# Patient Record
Sex: Male | Born: 1990 | Race: White | Hispanic: No | Marital: Single | Smoking: Current some day smoker
Health system: Southern US, Community
[De-identification: ages and names within clinical notes are randomized; demographics above are authoritative.]

## PROBLEM LIST (undated history)

## (undated) DIAGNOSIS — Z21 Asymptomatic human immunodeficiency virus [HIV] infection status: Secondary | ICD-10-CM

## (undated) DIAGNOSIS — B20 Human immunodeficiency virus [HIV] disease: Secondary | ICD-10-CM

## (undated) HISTORY — DX: Asymptomatic human immunodeficiency virus (hiv) infection status: Z21

## (undated) HISTORY — PX: INNER EAR SURGERY: SHX679

## (undated) HISTORY — DX: Human immunodeficiency virus (HIV) disease: B20

## (undated) HISTORY — PX: ANAL FISTULOTOMY: SHX6423

---
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
## 2010-07-23 ENCOUNTER — Emergency Department (HOSPITAL_COMMUNITY)
Admission: EM | Admit: 2010-07-23 | Discharge: 2010-07-23 | Payer: Self-pay | Source: Home / Self Care | Admitting: Emergency Medicine

## 2010-10-04 LAB — URINALYSIS, ROUTINE W REFLEX MICROSCOPIC
Nitrite: NEGATIVE
Protein, ur: NEGATIVE mg/dL
Urobilinogen, UA: 0.2 mg/dL (ref 0.0–1.0)

## 2010-10-04 LAB — BASIC METABOLIC PANEL
BUN: 14 mg/dL (ref 6–23)
CO2: 24 mEq/L (ref 19–32)
Calcium: 9 mg/dL (ref 8.4–10.5)
Chloride: 105 mEq/L (ref 96–112)
Creatinine, Ser: 0.9 mg/dL (ref 0.4–1.5)
GFR calc Af Amer: >60 mL/min (ref >60)

## 2010-10-04 LAB — ETHANOL: Alcohol, Ethyl (B): 243 mg/dL — ABNORMAL HIGH (ref 0–10)

## 2010-10-04 LAB — CBC
MCH: 30.9 pg (ref 26.0–34.0)
MCV: 88.8 fL (ref 78.0–100.0)
Platelets: 190 10*3/uL (ref 150–400)
RBC: 4.99 MIL/uL (ref 4.22–5.81)
RDW: 12.7 % (ref 11.5–15.5)
WBC: 11.3 10*3/uL — ABNORMAL HIGH (ref 4.0–10.5)

## 2010-10-04 LAB — DIFFERENTIAL
Basophils Absolute: 0 10*3/uL (ref 0.0–0.1)
Eosinophils Absolute: 0 10*3/uL (ref 0.0–0.7)
Eosinophils Relative: 0 % (ref 0–5)
Neutrophils Relative %: 87 % — ABNORMAL HIGH (ref 43–77)

## 2010-10-04 LAB — RAPID URINE DRUG SCREEN, HOSP PERFORMED: Barbiturates: NOT DETECTED

## 2011-01-19 ENCOUNTER — Emergency Department (HOSPITAL_COMMUNITY)
Admission: EM | Admit: 2011-01-19 | Discharge: 2011-01-19 | Disposition: A | Discharge status: Home / Self Care | Payer: Medicaid Other | Attending: Emergency Medicine | Admitting: Emergency Medicine

## 2011-01-19 DIAGNOSIS — Y92009 Unspecified place in unspecified non-institutional (private) residence as the place of occurrence of the external cause: Secondary | ICD-10-CM | POA: Insufficient documentation

## 2011-01-19 DIAGNOSIS — W540XXA Bitten by dog, initial encounter: Secondary | ICD-10-CM | POA: Insufficient documentation

## 2011-01-19 DIAGNOSIS — IMO0002 Reserved for concepts with insufficient information to code with codable children: Secondary | ICD-10-CM | POA: Insufficient documentation

## 2021-05-24 ENCOUNTER — Encounter (HOSPITAL_COMMUNITY): Payer: Self-pay

## 2021-05-24 ENCOUNTER — Emergency Department (HOSPITAL_COMMUNITY)
Admission: EM | Admit: 2021-05-24 | Discharge: 2021-05-24 | Disposition: A | Discharge status: Home / Self Care | Payer: Self-pay | Attending: Emergency Medicine | Admitting: Emergency Medicine

## 2021-05-24 ENCOUNTER — Other Ambulatory Visit: Payer: Self-pay

## 2021-05-24 DIAGNOSIS — K644 Residual hemorrhoidal skin tags: Secondary | ICD-10-CM | POA: Insufficient documentation

## 2021-05-24 DIAGNOSIS — K6289 Other specified diseases of anus and rectum: Secondary | ICD-10-CM

## 2021-05-24 LAB — CBC WITH DIFFERENTIAL/PLATELET
Abs Immature Granulocytes: 0.03 10*3/uL (ref 0.00–0.07)
Basophils Absolute: 0 10*3/uL (ref 0.0–0.1)
Basophils Relative: 0 %
Eosinophils Absolute: 0 10*3/uL (ref 0.0–0.5)
Eosinophils Relative: 0 %
HCT: 41.9 % (ref 39.0–52.0)
Hemoglobin: 13.9 g/dL (ref 13.0–17.0)
Immature Granulocytes: 0 %
Lymphocytes Relative: 14 %
Lymphs Abs: 1.2 10*3/uL (ref 0.7–4.0)
MCH: 29.1 pg (ref 26.0–34.0)
MCHC: 33.2 g/dL (ref 30.0–36.0)
MCV: 87.8 fL (ref 80.0–100.0)
Monocytes Absolute: 0.5 10*3/uL (ref 0.1–1.0)
Monocytes Relative: 6 %
Neutro Abs: 7.1 10*3/uL (ref 1.7–7.7)
Neutrophils Relative %: 80 %
Platelets: 203 10*3/uL (ref 150–400)
RBC: 4.77 MIL/uL (ref 4.22–5.81)
RDW: 13.5 % (ref 11.5–15.5)
WBC: 8.8 10*3/uL (ref 4.0–10.5)
nRBC: 0 % (ref 0.0–0.2)

## 2021-05-24 LAB — COMPREHENSIVE METABOLIC PANEL
ALT: 31 U/L (ref 0–44)
AST: 32 U/L (ref 15–41)
Albumin: 4.2 g/dL (ref 3.5–5.0)
Alkaline Phosphatase: 67 U/L (ref 38–126)
Anion gap: 10 (ref 5–15)
BUN: 8 mg/dL (ref 6–20)
CO2: 27 mmol/L (ref 22–32)
Calcium: 9.4 mg/dL (ref 8.9–10.3)
Chloride: 102 mmol/L (ref 98–111)
Creatinine, Ser: 0.79 mg/dL (ref 0.61–1.24)
GFR, Estimated: >60 mL/min (ref >60)
Glucose, Bld: 85 mg/dL (ref 70–99)
Potassium: 3.7 mmol/L (ref 3.5–5.1)
Sodium: 139 mmol/L (ref 135–145)
Total Bilirubin: 0.6 mg/dL (ref 0.3–1.2)
Total Protein: 8.9 g/dL — ABNORMAL HIGH (ref 6.5–8.1)

## 2021-05-24 LAB — URINALYSIS, ROUTINE W REFLEX MICROSCOPIC
Bilirubin Urine: NEGATIVE
Glucose, UA: NEGATIVE mg/dL
Hgb urine dipstick: NEGATIVE
Ketones, ur: NEGATIVE mg/dL
Leukocytes,Ua: NEGATIVE
Nitrite: NEGATIVE
Protein, ur: NEGATIVE mg/dL
Specific Gravity, Urine: 1.018 (ref 1.005–1.030)
pH: 7 (ref 5.0–8.0)

## 2021-05-24 LAB — PROTIME-INR
INR: 1 (ref 0.8–1.2)
Prothrombin Time: 12.7 seconds (ref 11.4–15.2)

## 2021-05-24 LAB — TYPE AND SCREEN
ABO/RH(D): O NEG
Antibody Screen: NEGATIVE

## 2021-05-24 LAB — LIPASE, BLOOD: Lipase: 26 U/L (ref 11–51)

## 2021-05-24 MED ORDER — POLYETHYLENE GLYCOL 3350 17 GM/SCOOP PO POWD: 17.0000 g | Freq: Every day | ORAL | 0 refills | Status: AC

## 2021-05-24 MED ORDER — PRAMOXINE HCL (PERIANAL) 1 % EX FOAM: 1.0000 "application " | Freq: Three times a day (TID) | CUTANEOUS | 0 refills | Status: DC | PRN

## 2021-05-24 NOTE — ED Provider Notes (Signed)
[facility] DEPT Provider Note   CSN: [id number] Arrival date & time: 05/24/21  1328     History Chief Complaint  Patient presents with   Hemorrhoids   Rectal Bleeding    Gary Barrett is a 30 y.o. male.  This is a 30 y.o. male with significant medical history as below, including family history of colon cancer, hemorrhoids who presents to the ED with complaint of pain with defecation, blood in bowel movements  Location:  peri-anal Duration:  intermittent over last month, worst in last week or so Onset:  gradual Timing:  intermittent to constant  Description:  sharp pain to rectal area Severity:  mild Exacerbating/Alleviating Factors:  worse with defecation Associated Symptoms:  intermittent constipation, blood in stool. He has limited oral intake to help reduce number of bowel movements. Pertinent Negatives:  no fevers, chills, nausea, vomiting. No cp or dyspnea.     The history is provided by the patient. No language interpreter was used.  Rectal Bleeding Associated symptoms: no abdominal pain, no fever and no vomiting       History reviewed. No pertinent past medical history.  There are no problems to display for this patient.   History reviewed. No pertinent surgical history.     History reviewed. No pertinent family history.     Home Medications Prior to Admission medications   Medication Sig Start Date End Date Taking? Authorizing Provider  polyethylene glycol powder (GLYCOLAX/MIRALAX) 17 GM/SCOOP powder Take 17 g by mouth daily for 7 doses. 05/24/21 05/31/21 Yes Wynona Dove A, DO  pramoxine (PROCTOFOAM) 1 % foam Place 1 application rectally 3 (three) times daily as needed for hemorrhoids. 05/24/21  Yes Jeanell Sparrow, DO    Allergies    Patient has no known allergies.  Review of Systems   Review of Systems  Constitutional:  Negative for chills and fever.  HENT:  Negative for facial swelling and trouble swallowing.    Eyes:  Negative for photophobia and visual disturbance.  Respiratory:  Negative for cough and shortness of breath.   Cardiovascular:  Negative for chest pain and palpitations.  Gastrointestinal:  Positive for blood in stool, constipation, hematochezia and rectal pain. Negative for abdominal pain, nausea and vomiting.  Endocrine: Negative for polydipsia and polyuria.  Genitourinary:  Negative for difficulty urinating and hematuria.  Musculoskeletal:  Negative for gait problem and joint swelling.  Skin:  Negative for pallor and rash.  Neurological:  Negative for syncope and headaches.  Psychiatric/Behavioral:  Negative for agitation and confusion.    Physical Exam Updated Vital Signs BP 134/84 (BP Location: Left Arm)   Pulse 96   Temp 98.4 F (36.9 C) (Oral)   Resp 18   SpO2 100%   Physical Exam Vitals and nursing note reviewed. Exam conducted with a chaperone present.  Constitutional:      General: He is not in acute distress.    Appearance: He is well-developed.  HENT:     Head: Normocephalic and atraumatic.     Right Ear: External ear normal.     Left Ear: External ear normal.     Mouth/Throat:     Mouth: Mucous membranes are moist.  Eyes:     General: No scleral icterus. Cardiovascular:     Rate and Rhythm: Normal rate and regular rhythm.     Pulses: Normal pulses.     Heart sounds: Normal heart sounds.  Pulmonary:     Effort: Pulmonary effort is normal. No respiratory distress.  Breath sounds: Normal breath sounds.  Abdominal:     General: Abdomen is flat.     Palpations: Abdomen is soft.     Tenderness: There is no abdominal tenderness.  Genitourinary:   Musculoskeletal:        General: Normal range of motion.     Cervical back: Normal range of motion.     Right lower leg: No edema.     Left lower leg: No edema.  Skin:    General: Skin is warm and dry.     Capillary Refill: Capillary refill takes less than 2 seconds.  Neurological:     Mental Status:  He is alert and oriented to person, place, and time.  Psychiatric:        Mood and Affect: Mood normal.        Behavior: Behavior normal.    ED Results / Procedures / Treatments   Labs (all labs ordered are listed, but only abnormal results are displayed) Labs Reviewed  COMPREHENSIVE METABOLIC PANEL - Abnormal; Notable for the following components:      Result Value   Total Protein 8.9 (*)    All other components within normal limits  CBC WITH DIFFERENTIAL/PLATELET  PROTIME-INR  LIPASE, BLOOD  URINALYSIS, ROUTINE W REFLEX MICROSCOPIC  TYPE AND SCREEN    EKG None  Radiology No results found.  Procedures Procedures   Medications Ordered in ED Medications - No data to display  ED Course  I have reviewed the triage vital signs and the nursing notes.  Pertinent labs & imaging results that were available during my care of the patient were reviewed by me and considered in my medical decision making (see chart for details).    MDM Rules/Calculators/A&P                           CC: rectal pain  This patient complains of above; this involves an extensive number of treatment options and is a complaint that carries with it a high risk of complications and morbidity. Vital signs were reviewed. Serious etiologies considered.  Physical exam consistent with external hemorrhoid, does not appear to be thrombosed.  No frank bleeding externally appreciated.  No fissure.  No recent sexual activity  Record review:  Previous records obtained and reviewed    Work up as above, notable for:  Labs  that were available during my care of the patient were reviewed by me and considered in my medical decision making.    Labs reviewed and are stable  Hgb stable.  Patient with external hemorrhoid on exam.  Discussed supportive care at home. Sitz baths, laxative, topical therapy, o/p f/u with GI for c-scope. Pt agreeable.  The patient improved significantly and was discharged in stable  condition. Detailed discussions were had with the patient regarding current findings, and need for close f/u with PCP or on call doctor. The patient has been instructed to return immediately if the symptoms worsen in any way for re-evaluation. Patient verbalized understanding and is in agreement with current care plan. All questions answered prior to discharge.            This chart was dictated using voice recognition software.  Despite best efforts to proofread,  errors can occur which can change the documentation meaning.  Final Clinical Impression(s) / ED Diagnoses Final diagnoses:  External hemorrhoid  Rectal pain    Rx / DC Orders ED Discharge Orders  Ordered    pramoxine (PROCTOFOAM) 1 % foam  3 times daily PRN        05/24/21 2112    polyethylene glycol powder (GLYCOLAX/MIRALAX) 17 GM/SCOOP powder  Daily        05/24/21 2112             Sloan Leiter, DO 05/24/21 2115

## 2021-05-24 NOTE — ED Provider Notes (Signed)
Emergency Medicine Provider Triage Evaluation Note  Gary Barrett , a 30 y.o. male  was evaluated in triage.  Pt complains of rectal pain and rectal bleeding.  Been ongoing for couple weeks, he tried Metamucil and Preparation H.  Bright red blood per rectum, increasing amounts of blood every bowel movement.  Nausea and vomiting the last 2 days.    No prior abdominal surgeries, not on blood thinners, no history of ulcers, no anti-inflammatory use, alcohol once a week, no dark and tarry stool, not on iron supplements or Pepto-Bismol.    Strong family history for colon cancer, has not had a colonoscopy yet.  No history of IBD..  .  Review of Systems  Positive: Rectal bleeding, rectal pain, nausea, vomiting Negative: Fevers  Physical Exam  BP 134/84 (BP Location: Left Arm)   Pulse 96   Temp 98.4 F (36.9 C) (Oral)   Resp 18   SpO2 100%  Gen:   Awake, no distress   Resp:  Normal effort  MSK:   Moves extremities without difficulty  Other:  Abdomen is soft, not particularly tender.  Rectal exam deferred till in room.  Medical Decision Making  Medically screening exam initiated at 2:25 PM.  Appropriate orders placed.  HIROYUKI OZANICH was informed that the remainder of the evaluation will be completed by another provider, this initial triage assessment does not replace that evaluation, and the importance of remaining in the ED until their evaluation is complete.  Rectal bleeding work-up   Theron Arista, PA-C 05/24/21 1429    Derwood Kaplan, MD 05/26/21 [phone]

## 2021-05-24 NOTE — ED Triage Notes (Signed)
Pt reports severe rectal pain and blood bowel movements. Pt reports hx of hemorrhoids and believes this is related to his symptoms.

## 2021-05-24 NOTE — Discharge Instructions (Addendum)
Increase water intake, continue taking metamucil

## 2021-10-17 ENCOUNTER — Encounter (HOSPITAL_COMMUNITY): Payer: Self-pay

## 2021-10-17 ENCOUNTER — Emergency Department (HOSPITAL_COMMUNITY): Payer: Self-pay

## 2021-10-17 ENCOUNTER — Observation Stay (HOSPITAL_COMMUNITY)
Admission: EM | Admit: 2021-10-17 | Discharge: 2021-10-19 | Disposition: A | Discharge status: Home / Self Care | Payer: Self-pay | Attending: Internal Medicine | Admitting: Internal Medicine

## 2021-10-17 DIAGNOSIS — R61 Generalized hyperhidrosis: Secondary | ICD-10-CM

## 2021-10-17 DIAGNOSIS — F17210 Nicotine dependence, cigarettes, uncomplicated: Secondary | ICD-10-CM | POA: Insufficient documentation

## 2021-10-17 DIAGNOSIS — Z7252 High risk homosexual behavior: Secondary | ICD-10-CM

## 2021-10-17 DIAGNOSIS — G629 Polyneuropathy, unspecified: Secondary | ICD-10-CM

## 2021-10-17 DIAGNOSIS — Z114 Encounter for screening for human immunodeficiency virus [HIV]: Secondary | ICD-10-CM

## 2021-10-17 DIAGNOSIS — R55 Syncope and collapse: Principal | ICD-10-CM | POA: Diagnosis present

## 2021-10-17 DIAGNOSIS — Z8241 Family history of sudden cardiac death: Secondary | ICD-10-CM

## 2021-10-17 DIAGNOSIS — R0602 Shortness of breath: Secondary | ICD-10-CM | POA: Insufficient documentation

## 2021-10-17 DIAGNOSIS — Z8249 Family history of ischemic heart disease and other diseases of the circulatory system: Secondary | ICD-10-CM

## 2021-10-17 DIAGNOSIS — R519 Headache, unspecified: Secondary | ICD-10-CM

## 2021-10-17 LAB — CBC
HCT: 41.5 % (ref 39.0–52.0)
Hemoglobin: 13.8 g/dL (ref 13.0–17.0)
MCH: 29 pg (ref 26.0–34.0)
MCHC: 33.3 g/dL (ref 30.0–36.0)
MCV: 87.2 fL (ref 80.0–100.0)
Platelets: 184 10*3/uL (ref 150–400)
RBC: 4.76 MIL/uL (ref 4.22–5.81)
RDW: 13.7 % (ref 11.5–15.5)
WBC: 4.3 10*3/uL (ref 4.0–10.5)
nRBC: 0 % (ref 0.0–0.2)

## 2021-10-17 LAB — BASIC METABOLIC PANEL
Anion gap: 7 (ref 5–15)
BUN: 17 mg/dL (ref 6–20)
CO2: 30 mmol/L (ref 22–32)
Calcium: 9.2 mg/dL (ref 8.9–10.3)
Chloride: 103 mmol/L (ref 98–111)
Creatinine, Ser: 1.21 mg/dL (ref 0.61–1.24)
GFR, Estimated: >60 mL/min (ref >60)
Glucose, Bld: 85 mg/dL (ref 70–99)
Potassium: 3.8 mmol/L (ref 3.5–5.1)
Sodium: 140 mmol/L (ref 135–145)

## 2021-10-17 LAB — URINALYSIS, ROUTINE W REFLEX MICROSCOPIC
Bilirubin Urine: NEGATIVE
Glucose, UA: NEGATIVE mg/dL
Hgb urine dipstick: NEGATIVE
Ketones, ur: NEGATIVE mg/dL
Leukocytes,Ua: NEGATIVE
Nitrite: NEGATIVE
Protein, ur: 30 mg/dL — AB
Specific Gravity, Urine: 1.02 (ref 1.005–1.030)
pH: 6.5 (ref 5.0–8.0)

## 2021-10-17 LAB — D-DIMER, QUANTITATIVE: D-Dimer, Quant: <0.27 ug/mL-FEU (ref 0.00–0.50)

## 2021-10-17 LAB — URINALYSIS, MICROSCOPIC (REFLEX)

## 2021-10-17 LAB — CBG MONITORING, ED: Glucose-Capillary: 97 mg/dL (ref 70–99)

## 2021-10-17 LAB — BRAIN NATRIURETIC PEPTIDE: B Natriuretic Peptide: 13.8 pg/mL (ref 0.0–100.0)

## 2021-10-17 LAB — TROPONIN I (HIGH SENSITIVITY): Troponin I (High Sensitivity): 21 ng/L — ABNORMAL HIGH (ref <18)

## 2021-10-17 NOTE — ED Triage Notes (Signed)
Patient arrives with complaints of a headache over the last week and two syncopal episodes, last time just prior to arrival. Vomited once after last syncopal episode.  ?

## 2021-10-17 NOTE — ED Notes (Signed)
Ambulated pt in hallway, pt O2 stayed above 94%, pt stated he did not feel lightheaded or dizzy.  ?

## 2021-10-17 NOTE — ED Provider Notes (Signed)
?[facility] DEPT ?Provider Note ? ? ?CSN: [id number] ?Arrival date & time: 10/17/21  1915 ? ?  ? ?History ? ?Chief Complaint  ?Patient presents with  ?? Loss of Consciousness  ? ? ?Gary Barrett is a 31 y.o. male with no significant past medical history who presents emergency department with complaint of exertional dyspnea and syncope.  Patient complains of 3 episodes of syncope over the past week.  Patient states that he has a prodrome of vision loss, jerking of his muscles, diffuse diaphoresis and was able to usually get himself down and sit.  Today he had an episode while at [employer] and lost consciousness.  This was followed by an episode of vomiting.  Patient also reports that he has had constant exertional dyspnea over the past week as well.  He notes that when he walks short distances he gets extremely winded and has to sit down and rest.  This is new.  Patient states that he was diagnosed with prolapsed hemorrhoids that have been persistently bleeding and thought maybe this was due to slow oozing from his bottom.  He is uninsured and has not yet been able to follow-up with anyone in the outpatient setting.  He has no significant rectal pain.  He denies unilateral leg swelling.  He has no history of DVT or PE.  His mother did die 2 months ago after 3 heart attacks.  He states that he has been smoking cigarettes since then and wondered if this might be the cause.  He has states he has been sleeping okay and eating and drinking well.  He denies any nausea vomiting or diarrhea that might contribute to volume loss, palpitations, racing or skipping in his heart.  He has a family history of sudden cardiac death in a first cousin.  Patient denies chest pain, hemoptysis, orthopnea or PND. ? ? ?Loss of Consciousness ? ?  ? ?Home Medications ?Prior to Admission medications   ?Medication Sig Start Date End Date Taking? Authorizing Provider  ?pramoxine (PROCTOFOAM) 1 % foam Place 1  application rectally 3 (three) times daily as needed for hemorrhoids. 05/24/21   Jeanell Sparrow, DO  ?   ? ?Allergies    ?Patient has no known allergies.   ? ?Review of Systems   ?Review of Systems  ?Cardiovascular:  Positive for syncope.  ? ?Physical Exam ?Updated Vital Signs ?BP 128/87   Pulse 96   Temp 98.1 ?F (36.7 ?C) (Oral)   Resp (!) 21   Wt 77.1 kg   SpO2 93%  ?Physical Exam ?Vitals and nursing note reviewed.  ?Constitutional:   ?   General: He is not in acute distress. ?   Appearance: He is well-developed. He is not diaphoretic.  ?HENT:  ?   Head: Normocephalic and atraumatic.  ?Eyes:  ?   General: No scleral icterus. ?   Conjunctiva/sclera: Conjunctivae normal.  ?Cardiovascular:  ?   Rate and Rhythm: Normal rate and regular rhythm.  ?   Heart sounds: Normal heart sounds.  ?Pulmonary:  ?   Effort: Pulmonary effort is normal. No respiratory distress.  ?   Breath sounds: Normal breath sounds.  ?Abdominal:  ?   Palpations: Abdomen is soft.  ?   Tenderness: There is no abdominal tenderness.  ?Musculoskeletal:  ?   Cervical back: Normal range of motion and neck supple.  ?   Right lower leg: No edema.  ?   Left lower leg: No edema.  ?Skin: ?  General: Skin is warm and dry.  ?Neurological:  ?   General: No focal deficit present.  ?   Mental Status: He is alert and oriented to person, place, and time.  ?   Cranial Nerves: No cranial nerve deficit.  ?   Sensory: No sensory deficit.  ?   Motor: No weakness.  ?   Coordination: Coordination normal.  ?   Gait: Gait normal.  ?   Deep Tendon Reflexes: Reflexes normal.  ?Psychiatric:     ?   Behavior: Behavior normal.  ? ? ?ED Results / Procedures / Treatments   ?Labs ?(all labs ordered are listed, but only abnormal results are displayed) ?Labs Reviewed  ?URINALYSIS, ROUTINE W REFLEX MICROSCOPIC - Abnormal; Notable for the following components:  ?    Result Value  ? Protein, ur 30 (*)   ? All other components within normal limits  ?URINALYSIS, MICROSCOPIC (REFLEX) -  Abnormal; Notable for the following components:  ? Bacteria, UA RARE (*)   ? All other components within normal limits  ?BASIC METABOLIC PANEL  ?CBC  ?D-DIMER, QUANTITATIVE  ?BRAIN NATRIURETIC PEPTIDE  ?CBG MONITORING, ED  ?TROPONIN I (HIGH SENSITIVITY)  ? ? ?EKG ?EKG Interpretation ? ?Date/Time:  Sunday October 17 2021 19:30:58 EDT ?Ventricular Rate:  87 ?PR Interval:  117 ?QRS Duration: 89 ?QT Interval:  342 ?QTC Calculation: 412 ?R Axis:   93 ?Text Interpretation: Sinus rhythm Borderline short PR interval Borderline right axis deviation Confirmed by Ronnald Nian, Adam ([id number]) on 10/17/2021 8:08:35 PM ? ?Radiology ?CT Head Wo Contrast ? ?Result Date: 10/17/2021 ?CLINICAL DATA:  Headache over the last week. EXAM: CT HEAD WITHOUT CONTRAST TECHNIQUE: Contiguous axial images were obtained from the base of the skull through the vertex without intravenous contrast. RADIATION DOSE REDUCTION: This exam was performed according to the departmental dose-optimization program which includes automated exposure control, adjustment of the mA and/or kV according to patient size and/or use of iterative reconstruction technique. COMPARISON:  None. FINDINGS: Brain: No evidence of acute infarction, hemorrhage, hydrocephalus, extra-axial collection or mass lesion/mass effect. Vascular: No hyperdense vessel or unexpected calcification. Skull: Normal. Negative for fracture or focal lesion. Sinuses/Orbits: No acute finding. Other: None. IMPRESSION: No acute intracranial process. Electronically Signed   By: Zerita Boers M.D.   On: 10/17/2021 20:10   ? ?Procedures ?Procedures  ? ? ?Medications Ordered in ED ?Medications - No data to display ? ?ED Course/ Medical Decision Making/ A&P ?Clinical Course as of 10/19/21 2309  ?Sun Oct 17, 2021  ?2128 SpO2: 90 % [AH]  ?2348 CBC [AH]  ?[id number] Basic metabolic panel [AH]  ?2348 Urinalysis, Routine w reflex microscopic Urine, Clean Catch(!) [AH]  ?2348 D-dimer, quantitative [AH]  ?2348 Brain natriuretic peptide  [AH]  ?2348 Troponin I (High Sensitivity)(!): 21 [AH]  ?2349 DG Chest 2 View ?No acute findings [AH]  ?2349 ED EKG ?Sinus rhythm rate of 87 [AH]  ?2349 CT Head Wo Contrast ?I visualized ct - no acute findings [AH]  ?Mon Oct 18, 2021  ?[id number] Case discussed with Dr. Alfred Levins of cardiology who recommends a medicine for syncope work up. [AH]  ?  ?Clinical Course User Index ?[AH] Margarita Mail, PA-C  ? ?                        ?Medical Decision Making ?Patient here with 3 episodes of syncope and sob this week. The differential for syncope is extensive and includes, but is not limited to: arrythmia Tanna Furry,  SVT, SSS, sinus arrest, AV block, bradycardia) aortic stenosis, AMI, HOCM, PE, atrial myxoma, pulmonary hypertension, orthostatic hypotension, (hypovolemia, drug effect, GB syndrome, micturition, cough, swall) carotid sinus sensitivity, Seizure, TIA/CVA, hypoglycemia,  Vertigo. ? ?Famly hx of sudden cardiac death. ?Case disucussed with Dr. Hal Hope - will admit. ? ? ?Amount and/or Complexity of Data Reviewed ?Labs: ordered. Decision-making details documented in ED Course. ?Radiology: ordered and independent interpretation performed. Decision-making details documented in ED Course. ?ECG/medicine tests: ordered and independent interpretation performed. Decision-making details documented in ED Course. ? ?Risk ?Decision regarding hospitalization. ? ?Final Clinical Impression(s) / ED Diagnoses ?Final diagnoses:  ?None  ? ? ?Rx / DC Orders ?ED Discharge Orders   ? ? None  ? ?  ? ? ?  ?Margarita Mail, PA-C ?10/19/21 2309 ? ?  ?Lennice Sites, DO ?10/20/21 [id number] ? ?

## 2021-10-18 ENCOUNTER — Observation Stay (HOSPITAL_COMMUNITY): Payer: Self-pay

## 2021-10-18 ENCOUNTER — Other Ambulatory Visit: Payer: Self-pay | Admitting: Student

## 2021-10-18 ENCOUNTER — Observation Stay (HOSPITAL_BASED_OUTPATIENT_CLINIC_OR_DEPARTMENT_OTHER): Payer: Self-pay

## 2021-10-18 ENCOUNTER — Observation Stay (HOSPITAL_COMMUNITY)
Admit: 2021-10-18 | Discharge: 2021-10-18 | Disposition: A | Discharge status: Home / Self Care | Payer: Self-pay | Attending: Internal Medicine | Admitting: Internal Medicine

## 2021-10-18 ENCOUNTER — Other Ambulatory Visit: Payer: Self-pay

## 2021-10-18 ENCOUNTER — Encounter (HOSPITAL_COMMUNITY): Payer: Self-pay | Admitting: Internal Medicine

## 2021-10-18 DIAGNOSIS — R55 Syncope and collapse: Secondary | ICD-10-CM

## 2021-10-18 DIAGNOSIS — Z8249 Family history of ischemic heart disease and other diseases of the circulatory system: Secondary | ICD-10-CM

## 2021-10-18 DIAGNOSIS — G629 Polyneuropathy, unspecified: Secondary | ICD-10-CM

## 2021-10-18 DIAGNOSIS — Z8241 Family history of sudden cardiac death: Secondary | ICD-10-CM

## 2021-10-18 DIAGNOSIS — R569 Unspecified convulsions: Secondary | ICD-10-CM

## 2021-10-18 DIAGNOSIS — R519 Headache, unspecified: Secondary | ICD-10-CM

## 2021-10-18 DIAGNOSIS — R61 Generalized hyperhidrosis: Secondary | ICD-10-CM

## 2021-10-18 LAB — CBC
HCT: 38.5 % — ABNORMAL LOW (ref 39.0–52.0)
Hemoglobin: 12.6 g/dL — ABNORMAL LOW (ref 13.0–17.0)
MCH: 28.8 pg (ref 26.0–34.0)
MCHC: 32.7 g/dL (ref 30.0–36.0)
MCV: 87.9 fL (ref 80.0–100.0)
Platelets: 156 10*3/uL (ref 150–400)
RBC: 4.38 MIL/uL (ref 4.22–5.81)
RDW: 13.7 % (ref 11.5–15.5)
WBC: 3.8 10*3/uL — ABNORMAL LOW (ref 4.0–10.5)
nRBC: 0 % (ref 0.0–0.2)

## 2021-10-18 LAB — COMPREHENSIVE METABOLIC PANEL
ALT: 29 U/L (ref 0–44)
AST: 23 U/L (ref 15–41)
Albumin: 3.8 g/dL (ref 3.5–5.0)
Alkaline Phosphatase: 69 U/L (ref 38–126)
Anion gap: 9 (ref 5–15)
BUN: 15 mg/dL (ref 6–20)
CO2: 23 mmol/L (ref 22–32)
Calcium: 9.1 mg/dL (ref 8.9–10.3)
Chloride: 106 mmol/L (ref 98–111)
Creatinine, Ser: 1.07 mg/dL (ref 0.61–1.24)
GFR, Estimated: >60 mL/min (ref >60)
Glucose, Bld: 89 mg/dL (ref 70–99)
Potassium: 4 mmol/L (ref 3.5–5.1)
Sodium: 138 mmol/L (ref 135–145)
Total Bilirubin: 0.7 mg/dL (ref 0.3–1.2)
Total Protein: 8 g/dL (ref 6.5–8.1)

## 2021-10-18 LAB — ECHOCARDIOGRAM COMPLETE
AR max vel: 2.48 cm2
AV Peak grad: 6.4 mmHg
Ao pk vel: 1.26 m/s
Area-P 1/2: 4.68 cm2
Calc EF: 63.5 %
Height: 69 in
S' Lateral: 3.2 cm
Single Plane A2C EF: 63.8 %
Single Plane A4C EF: 62.2 %
Weight: 2628.8 oz

## 2021-10-18 LAB — LIPID PANEL
Cholesterol: 167 mg/dL (ref 0–200)
HDL: 21 mg/dL — ABNORMAL LOW (ref >40)
LDL Cholesterol: 121 mg/dL — ABNORMAL HIGH (ref 0–99)
Total CHOL/HDL Ratio: 8 RATIO
Triglycerides: 127 mg/dL (ref <150)
VLDL: 25 mg/dL (ref 0–40)

## 2021-10-18 LAB — HIV ANTIBODY (ROUTINE TESTING W REFLEX): HIV Screen 4th Generation wRfx: REACTIVE — AB

## 2021-10-18 LAB — HEMOGLOBIN A1C
Hgb A1c MFr Bld: 5.4 % (ref 4.8–5.6)
Mean Plasma Glucose: 108.28 mg/dL

## 2021-10-18 LAB — TSH: TSH: 2.204 u[IU]/mL (ref 0.350–4.500)

## 2021-10-18 LAB — TROPONIN I (HIGH SENSITIVITY): Troponin I (High Sensitivity): 16 ng/L (ref <18)

## 2021-10-18 MED ORDER — SODIUM CHLORIDE 0.9 % IV SOLN: INTRAVENOUS | Status: DC

## 2021-10-18 MED ORDER — METOPROLOL TARTRATE 50 MG PO TABS
50.0000 mg | ORAL_TABLET | Freq: Once | ORAL | Status: AC
  Administered 2021-10-19: 100 mg via ORAL
  Filled 2021-10-18: qty 2

## 2021-10-18 MED ORDER — GADOBUTROL 1 MMOL/ML IV SOLN
7.5000 mL | Freq: Once | INTRAVENOUS | Status: AC | PRN
  Administered 2021-10-18: 7.5 mL via INTRAVENOUS

## 2021-10-18 MED ORDER — HEPARIN SODIUM (PORCINE) 5000 UNIT/ML IJ SOLN
5000.0000 [IU] | Freq: Three times a day (TID) | INTRAMUSCULAR | Status: DC
  Administered 2021-10-18 – 2021-10-19 (×2): 5000 [IU] via SUBCUTANEOUS
  Filled 2021-10-18 (×4): qty 1

## 2021-10-18 MED ORDER — METOPROLOL TARTRATE 50 MG PO TABS: 50.0000 mg | ORAL_TABLET | Freq: Once | ORAL | Status: DC | PRN

## 2021-10-18 NOTE — Plan of Care (Signed)
EEG negative for epileptiform activity. Plan unchanged as documented in full consult note.  ? ?No charge note.  ?

## 2021-10-18 NOTE — Procedures (Signed)
Patient Name: Gary Barrett  ?MRN: [id number]  ?Epilepsy Attending: Charlsie Quest  ?Referring Physician/Provider: Eduard Clos, MD ?Date: 10/18/2021 ?Duration: 24.14 mins ? ?Patient history: 31 y.o. male with no significant past medical history presenting with headache and 3 episodes of loss of consciousness over the last 3 weeks.He feels like his head was bobbing slightly during the episode and feels like he had some twitching in the left arm and neck but this would resolve if he touched his neck or arm. EEG to evaluate for seizure ? ?Level of alertness: Awake ? ?AEDs during EEG study: None ? ?Technical aspects: This EEG study was done with scalp electrodes positioned according to the 10-20 International system of electrode placement. Electrical activity was acquired at a sampling rate of 500Hz  and reviewed with a high frequency filter of 70Hz  and a low frequency filter of 1Hz . EEG data were recorded continuously and digitally stored.  ? ?Description: The posterior dominant rhythm consists of 9-10 Hz activity of moderate voltage (25-35 uV) seen predominantly in posterior head regions, symmetric and reactive to eye opening and eye closing. Hyperventilation and photic stimulation were not performed.    ? ?IMPRESSION: ?This study is within normal limits. No seizures or epileptiform discharges were seen throughout the recording. ? ?  ? ?

## 2021-10-18 NOTE — Consult Note (Signed)
Neurology Consultation ?Reason for Consult: Episodes of loss of consciousness, concern for seizure ?Requesting Physician: Hartley Barefoot ? ?CC: Headache with loss of consciousness ? ?History is obtained from: Patient and chart review ? ?HPI: Gary Barrett is a 31 y.o. male with no significant past medical history presenting with headache and 3 episodes of loss of consciousness over the last 3 weeks. ? ?He reports no recent infections or other concerning health history.  He has had some hemorrhoids.  3 weeks ago the day of the slight dusting of snow he felt acutely hot, blurred vision, saw stars/had his vision fade to black and white and then lost consciousness.  He feels like his head was bobbing slightly during the episode and feels like he had some twitching in the left arm and neck but this would resolve if he touched his neck or arm.  Postevent he had no residual confusion or weakness.  He feels like the twitching is some sort of nerve issue.  He had a similar twitching in both of his legs while at the hospital that improved with position change.  He denies any neck trauma or other recent trauma.  He had another event 3 days after the first event, followed by an event the day of admission.  Each of these events were quite similar, and there is no clear inciting factor that he can identify.  He additionally notes that he had an episode a few days ago with which he was able to abort by laying down ? ?He has no seizure risk factors other than a strong family history of seizures with his brother currently well controlled on 2 antiseizure medications including Dilantin (seizures started in childhood and he does not know details of the diagnosis).  Mom and grandmother also had seizures.  He denies any personal history of CNS infections, head trauma with loss of consciousness, or abnormalities of birth/development. ? ?He started smoking in January 2023, occasionally, to manage stress due to the death of his  mother from coronary artery disease.  He additionally drinks 3 pots of coffee a day, but reports he sleeps well and denies any excessive fatigue.  He does note some mild shortness of breath that he has attributed to starting smoking, including exertional shortness of breath but also being told by family members/friends that he is breathing heavily at rest occasionally. ? ?Regarding his headache, he reports that this started at the same time as the syncopal events and is bifrontal and a sensation of heat.  It seems to be somewhat worse with laying down and better with standing up but has not been associated with any pulsatile tinnitus, vision changes including transient loss of vision episodes, is not worse with Valsalva, is not aggravated by light or sound, is not associated with nausea or vomiting. ? ?ROS: All other review of systems was negative except as noted in the HPI.  ? ?History reviewed. No pertinent past medical history. ? ? ?Family History  ?Problem Relation Age of Onset  ? CAD Mother   ? Colon cancer Maternal Grandmother   ? Sudden death Cousin   ? ? ? ?Social History:  reports that he has been smoking cigarettes. He has never used smokeless tobacco. He reports that he does not use drugs. No history on file for alcohol use. ? ? ? ?Exam: ?Current vital signs: ?BP 139/87 (BP Location: Left Arm)   Pulse 78   Temp 98.5 ?F (36.9 ?C) (Oral)   Resp 16   Ht  5\' 9"  (1.753 m)   Wt 74.5 kg   SpO2 99%   BMI 24.26 kg/m?  ?Vital signs in last 24 hours: ?Temp:  [98.1 ?F (36.7 ?C)-98.8 ?F (37.1 ?C)] 98.5 ?F (36.9 ?C) (03/27 0509) ?Pulse Rate:  [66-96] 78 (03/27 1107) ?Resp:  [16-21] 16 (03/27 0509) ?BP: (115-157)/(67-96) 139/87 (03/27 1107) ?SpO2:  [90 %-99 %] 99 % (03/27 1107) ?Weight:  [74.5 kg-77.1 kg] 74.5 kg (03/27 0111) ? ? ?Physical Exam  ?Constitutional: Appears well-developed and well-nourished.  ?Psych: Affect appropriate to situation, mildly anxious but calm and cooperative ?Eyes: No scleral  injection ?HENT: No oropharyngeal obstruction.  ?MSK: no joint deformities.  ?Cardiovascular: Normal rate and regular rhythm.  Perfusing extremities well ?Respiratory: Effort normal, non-labored breathing ?GI: Soft.  No distension. There is no tenderness.  ?Skin: Warm dry and intact visible skin ? ?Neuro: ?Mental Status: ?Patient is awake, alert, oriented to person, place, month, year, and situation. ?Patient is able to give a clear and coherent history. ?No signs of aphasia or neglect ?Cranial Nerves: ?II: Visual Fields are full. Pupils are equal, round, and reactive to light and accommodation.   ?III,IV, VI: EOMI without ptosis or diploplia.  ?V: Facial sensation is reportedly increased to temperature and pinprick on the left compared to the right ?VII: Facial movement is symmetric.  ?VIII: hearing is intact to voice ?X: Uvula elevates symmetrically ?XI: Shoulder shrug is symmetric. ?XII: tongue is midline without atrophy or fasciculations.  ?Motor: ?Tone is normal. Bulk is normal. 5/5 strength was present in all four extremities.  ?Sensory: ?Sensation is symmetric to light touch and temperature in the arms and legs. ?Deep Tendon Reflexes: ?3+ and symmetric in the brachioradialis, biceps, with positive Hoffmann's bilaterally.  2+ and symmetric patellae and ankles ?Plantars: ?Toes are downgoing bilaterally.  ?Cerebellar: ?FNF and HKS are intact bilaterally ?Gait:  ?Able to rise on heels and toes.  Mild unsteadiness with tandem gait, but able to maintain tandem stance with eyes closed without falling for an extended period of time ? ?I have reviewed labs in epic and the results pertinent to this consultation are: ? ?Basic Metabolic Panel: ?Recent Labs  ?Lab 10/17/21 ?1936 10/18/21 ?0341  ?NA 140 138  ?K 3.8 4.0  ?CL 103 106  ?CO2 30 23  ?GLUCOSE 85 89  ?BUN 17 15  ?CREATININE 1.21 1.07  ?CALCIUM 9.2 9.1  ? ? ?CBC: ?Recent Labs  ?Lab 10/17/21 ?1936 10/18/21 ?0341  ?WBC 4.3 3.8*  ?HGB 13.8 12.6*  ?HCT 41.5 38.5*  ?MCV  87.2 87.9  ?PLT 184 156  ? ? ?Coagulation Studies: ?No results for input(s): LABPROT, INR in the last 72 hours.  ? ? 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  Agree with radiology read, this is otherwise an unremarkable study ? ?EEG pending ? ?Impression: These syncopal events do not sound primarily neurological in nature, and his headache symptoms coupled with negative MRI brain with contrast overall sounds like a benign process, perhaps related to stress and excessive caffeine use.  In particular, being able to abort the event by laying down suggestive of cerebral hypoperfusion/vasovagal event.  However given his strong family history of seizures and left-sided twitching during the events, obtaining a routine EEG is reasonable ? 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 ?Lesleigh Noe MD-PhD ?[facility] ?[phone] ?Lesleigh Noe MD-PhD ?[facility] ?[phone]  ?

## 2021-10-18 NOTE — Assessment & Plan Note (Addendum)
TSH normal

## 2021-10-18 NOTE — Addendum Note (Signed)
Addended by: Sande Rives on: 10/18/2021 04:25 PM ? ? Modules accepted: Orders ? ?

## 2021-10-18 NOTE — Progress Notes (Signed)
Spoke with Gordy Clement, RN regarding CT in an at [facility], updated pt of full plans for am, acknowledged understanding. Will report to pm RN tonight. SRP, RN ?

## 2021-10-18 NOTE — Plan of Care (Incomplete)

## 2021-10-18 NOTE — Progress Notes (Signed)
EEG complete - results pending 

## 2021-10-18 NOTE — Progress Notes (Signed)
Patient ID: Gary Barrett, male   DOB: [birth year], 31 y.o.   MRN: [id number] ? ?Planned cardiac CT scan at [facility] on 3/28 for 12 noon. ? ?[facility] pick up at 11:30 from [facility] 4th floor with arrival to [facility] at noon. ? ?For patient prep: ? ?Please get an 18g in the [facility] or higher and avoid using that arm for BP readings. ?Please avoid stimulants to heart after midnight i.e. no caffeine, chocolate, or antihistamines.  ?No food after 8am but water/ice chips is fine up to 11am on March 28. ?Give ordered metoprolol at 10am to achieve target HR of 55-65bpm. ? ?Call [phone] regarding any questions about cardiac CT scan. ?

## 2021-10-18 NOTE — Assessment & Plan Note (Addendum)
-  Patient presented after 2 syncope episode over the last week. ?-Due  to strong family history for CAD, and report of SOB  cardiology has been consulted.  Cardiology will arrange Holter monitor.  ?Patient reports a history of twitching of his left arm and back of his head prior to passing out.  Neurology will be consulted.  MRI and EEG negative. Neurology think syncope are vasovagal , but agrees with cardiology assessment for holter monitor.  ?-D dimer negative. Troponin not significantly elevated.  ?-EKG: Sinus rhythm.  ?Plan to discharge today, after CT results.  ?

## 2021-10-18 NOTE — Progress Notes (Addendum)
Ordered Event Monitor and outpatient coronary CTA for further evaluation of recurrent syncope. Please see consult note from today for more information. ? ?Corrin Parker, PA-C ?10/18/2021 3:17 PM ? ?ADDENDUM: ?Patient would prefer to do coronary CTA as inpatient instead so will do this tomorrow. ? ?Corrin Parker, PA-C ?10/18/2021 4:14 PM ? ? ? ?

## 2021-10-18 NOTE — H&P (Signed)
?History and Physical  ? ? ?Gary Barrett:[id number] DOB: 1990/08/12 DOA: 10/17/2021 ? ?PCP: Pcp, No  ?Patient coming from: Home. ? ?Chief Complaint: Loss of consciousness. ? ?HPI: Gary Barrett is a 31 y.o. male with no significant past medical history presents to the ER with complaints of having lost his consciousness.  Patient states this morning he was standing in the line at groceries when he felt dizzy then started having diaphoresis and then passed out.  Patient states he may have passed out for about 3 minutes.  Regained consciousness.  Did not have any chest pain palpitations prior to the episode.  Of recently has been getting exertional shortness of breath.  Also recently he had started smoking cigarettes.  A similar episode happened about 2 weeks ago.  And also about few weeks prior to that had another episode.  Patient states he did have some head-nodding like episode during these episodes. ? ?Patient states his mother died about 2 months ago from CAD.  Patient also states that his cousin who was 7 years old died suddenly while playing. ? ?He has been recently having increasing blood per rectum from hemorrhoids. ? ?ED Course: In the ER patient appears nonfocal not orthostatic.  CT head was unremarkable EKG did not show any prolonged QTc and electrolytes CBC were not showing anything acute.  ER physician discussed with cardiologist who advised getting admitted for observation and getting 2D echo. ? ?Review of Systems: As per HPI, rest all negative. ? ? ?History reviewed. No pertinent past medical history. ? ?Past Surgical History:  ?Procedure Laterality Date  ? INNER EAR SURGERY    ? ? ? reports that he has been smoking cigarettes. He has never used smokeless tobacco. He reports that he does not use drugs. No history on file for alcohol use. ? ?No Known Allergies ? ?Family History  ?Problem Relation Age of Onset  ? CAD Mother   ? Colon cancer Maternal Grandmother   ? Sudden death Cousin    ? ? ?Prior to Admission medications   ?Medication Sig Start Date End Date Taking? Authorizing Provider  ?ibuprofen (ADVIL) 200 MG tablet Take 400 mg by mouth in the morning and at bedtime.   Yes [provider]  ?pramoxine (PROCTOFOAM) 1 % foam Place 1 application rectally 3 (three) times daily as needed for hemorrhoids. ?Patient not taking: Reported on 10/17/2021 05/24/21   Sloan Leiter, DO  ? ? ?Physical Exam: ?Constitutional: Moderately built and nourished. ?Vitals:  ? 10/17/21 2111 10/17/21 2214 10/17/21 2346 10/18/21 0111  ?BP: (!) 147/67 131/85 131/84 133/82  ?Pulse: 76 80 66 71  ?Resp: 18 18 16 16   ?Temp:    98.8 ?F (37.1 ?C)  ?TempSrc:    Oral  ?SpO2: 90% 97% 95% 98%  ?Weight:      ? ?Eyes: Anicteric no pallor. ?ENMT: No discharge from the ears eyes nose and mouth. ?Neck: No mass felt.  No neck rigidity. ?Respiratory: No rhonchi or crepitations. ?Cardiovascular: S1-S2 heard. ?Abdomen: Soft nontender bowel sound present. ?Musculoskeletal: No edema. ?Skin: No rash. ?Neurologic: Alert awake oriented time place and person.  Moves all extremities. ?Psychiatric: Appears normal.  Normal affect. ? ? ?Labs on Admission: I have personally reviewed following labs and imaging studies ? ?CBC: ?Recent Labs  ?Lab 10/17/21 ?1936  ?WBC 4.3  ?HGB 13.8  ?HCT 41.5  ?MCV 87.2  ?PLT 184  ? ?Basic Metabolic Panel: ?Recent Labs  ?Lab 10/17/21 ?1936  ?NA 140  ?K 3.8  ?  CL 103  ?CO2 30  ?GLUCOSE 85  ?BUN 17  ?CREATININE 1.21  ?CALCIUM 9.2  ? ?GFR: ?CrCl cannot be calculated (Unknown ideal weight.). ?Liver Function Tests: ?No results for input(s): AST, ALT, ALKPHOS, BILITOT, PROT, ALBUMIN in the last 168 hours. ?No results for input(s): LIPASE, AMYLASE in the last 168 hours. ?No results for input(s): AMMONIA in the last 168 hours. ?Coagulation Profile: ?No results for input(s): INR, PROTIME in the last 168 hours. ?Cardiac Enzymes: ?No results for input(s): CKTOTAL, CKMB, CKMBINDEX, TROPONINI in the last 168 hours. ?BNP (last  3 results) ?No results for input(s): PROBNP in the last 8760 hours. ?HbA1C: ?No results for input(s): HGBA1C in the last 72 hours. ?CBG: ?Recent Labs  ?Lab 10/17/21 ?2015  ?GLUCAP 97  ? ?Lipid Profile: ?No results for input(s): CHOL, HDL, LDLCALC, TRIG, CHOLHDL, LDLDIRECT in the last 72 hours. ?Thyroid Function Tests: ?No results for input(s): TSH, T4TOTAL, FREET4, T3FREE, THYROIDAB in the last 72 hours. ?Anemia Panel: ?No results for input(s): VITAMINB12, FOLATE, FERRITIN, TIBC, IRON, RETICCTPCT in the last 72 hours. ?Urine analysis: ?   ?Component Value Date/Time  ? COLORURINE YELLOW 10/17/2021 1936  ? APPEARANCEUR CLEAR 10/17/2021 1936  ? LABSPEC 1.020 10/17/2021 1936  ? PHURINE 6.5 10/17/2021 1936  ? GLUCOSEU NEGATIVE 10/17/2021 1936  ? HGBUR NEGATIVE 10/17/2021 1936  ? BILIRUBINUR NEGATIVE 10/17/2021 1936  ? Lavenia AtlasKETONESUR NEGATIVE 10/17/2021 1936  ? PROTEINUR 30 (A) 10/17/2021 1936  ? UROBILINOGEN 0.2 07/23/2010 0628  ? NITRITE NEGATIVE 10/17/2021 1936  ? LEUKOCYTESUR NEGATIVE 10/17/2021 1936  ? ?Sepsis Labs: ?@[email or web address] (procalcitonin:4,lacticidven:4) ?)No results found for this or any previous visit (from the past 240 hour(s)).  ? ?Radiological Exams on Admission: ?DG Chest 2 View ? ?Result Date: 10/17/2021 ?CLINICAL DATA:  Headache, syncopal episodes, shortness of breath EXAM: CHEST - 2 VIEW COMPARISON:  None. FINDINGS: Cardiac and mediastinal contours are within normal limits. No focal pulmonary opacity. No pleural effusion or pneumothorax. No acute osseous abnormality. IMPRESSION: No acute cardiopulmonary process. Electronically Signed   By: Wiliam KeAlison  Vasan M.D.   On: 10/17/2021 21:39  ? ?CT Head Wo Contrast ? ?Result Date: 10/17/2021 ?CLINICAL DATA:  Headache over the last week. EXAM: CT HEAD WITHOUT CONTRAST TECHNIQUE: Contiguous axial images were obtained from the base of the skull through the vertex without intravenous contrast. RADIATION DOSE REDUCTION: This exam was performed according to the departmental  dose-optimization program which includes automated exposure control, adjustment of the mA and/or kV according to patient size and/or use of iterative reconstruction technique. COMPARISON:  None. FINDINGS: Brain: No evidence of acute infarction, hemorrhage, hydrocephalus, extra-axial collection or mass lesion/mass effect. Vascular: No hyperdense vessel or unexpected calcification. Skull: Normal. Negative for fracture or focal lesion. Sinuses/Orbits: No acute finding. Other: None. IMPRESSION: No acute intracranial process. Electronically Signed   By: Romona Curlsyler  Litton M.D.   On: 10/17/2021 20:10   ? ?EKG: Independently reviewed.  Normal sinus rhythm QTc of 412 ms. ? ?Assessment/Plan ?Principal Problem: ?  Syncope ?  ? ?Syncope -cause not clear.  Given the strong family history of CAD with patient's mother dying of CAD at age 31 and also causing dying at age 31 from unknown cause suddenly we will closely monitor in telemetry check 2D echo consult cardiology.  Since patient had some nodding of the head episode we will check EEG. ?Tobacco use -patient started recently using tobacco.  Advised about quitting. ?Patient has often noticed some bleeding per rectum attributed to hemorrhoids.  Advised that he will need follow-up with  GI.  Follow CBC. ? ? ?DVT prophylaxis: Lovenox. ?Code Status: Full code. ?Family Communication: Discussed with patient. ?Disposition Plan: Home. ?Consults called: Cardiology. ?Admission status: Observation. ? ? ?Eduard Clos MD ?[facility] ?Pager [phone]. ? ?If 7PM-7AM, please contact night-coverage ?[email or web address] ?Password [email or web address] ? ?10/18/2021, 1:31 AM  ? ? ? ?

## 2021-10-18 NOTE — Consult Note (Signed)
?Cardiology Consultation:  ? ?Patient ID: Gary Barrett ?MRN: [id number]; DOB: 07/25/1991 ? ?Admit date: 10/17/2021 ?Date of Consult: 10/18/2021 ? ?PCP:  Pcp, No ?  ?[facility] Providers ?Cardiologist:  None  ? ?Patient Profile:  ? ?Gary Barrett is a 31 y.o. male with no significant past medical history who is being seen today for evaluation of syncope at the request of Dr. Tyrell Antonio. ? ?History of Present Illness:  ? ?Gary Barrett is a 31 year old male with with no known past medical history prior to admission.  Patient presented to the ED on 10/17/2021 for further evaluation of headache and 2 syncopal episodes. ? ?Patient states he has had 3 syncopal episodes over the last month.  Each episode has been preceded by diaphoresis and lightheadedness lightheadedness/dizziness and then he states his vision goes black and he passes out.  None of them episodes have been witnessed but he estimates he is unconscious for less than 5 minutes.  These episodes do not seem to be related to position changes.  He denies any bowel/bladder incontinence or tongue biting suggestive of seizure. The first episode occurred about 3 weeks ago when the weather got really cold. The second episode occurred 3-4 days after using the restroom but he denies any significant straining at the time. Last episode occurred yesterday while standing in line at the grocery store.   He developed prodromal symptoms as above and ran outside where he then passed out.  He denies any palpitations or chest pain prior to these events but does note some mild increased work of breathing/shortness of breath. He denies any other shortness of breath but he does state that his friends have been telling him that he has been breathing mildly.  He states these presyncopal episodes are also preceded by a twinge on the left side of his neck as well as some numbness in his left arm.  No chest pain, orthopnea, PND, lower extremity edema.  No recent fevers or  illnesses.  He does report some night sweats but denies any unexplained weight loss.  He was diagnosed with hemorrhoids in 04/2021 and still has daily bleeding from the use but no other abnormal bleeding in urine or stools.  No cough, nasal congestion, or GI symptoms. ? ?Upon arrival to the ED, mildly hypertensive but otherwise vital normal.  EKG showed normal sinus rhythm with no acute ischemic changes and borderline short PR interval.  High-sensitivity troponin 21 >> 16.  D-dimer negative.  BNP normal.  Chest x-ray showed no acute findings.  Head CT showed no acute findings.  CMP met completely normal.  Urinalysis showed proteinuria of 30 but was otherwise normal.  He was admitted for further evaluation and Cardiology was consulted. ? ?Patient reports that he recently started smoking in January of this year but also notes that he will occasionally drink wine but no excessive alcohol use no drug use.  He does have a family history of CAD.  His mother died of a heart attack in her 58s in 06/2021.  His maternal grandmother also had a heart attack.  He states hypertension and diabetes also runs strongly on his mother side of the family.  A maternal aunt recently passed away but patient states she was very unhealthy.  He also has a maternal cousin who "dropped dead" while playing football a couple weeks ago.  No known family history of HOCM or any arrhythmias. ? ?History reviewed. No pertinent past medical history. ? ?Past Surgical History:  ?Procedure Laterality Date  ?  INNER EAR SURGERY    ?  ? ?Home Medications:  ?Prior to Admission medications   ?Medication Sig Start Date End Date Taking? Authorizing Provider  ?ibuprofen (ADVIL) 200 MG tablet Take 400 mg by mouth in the morning and at bedtime.   Yes [provider]  ?pramoxine (PROCTOFOAM) 1 % foam Place 1 application rectally 3 (three) times daily as needed for hemorrhoids. ?Patient not taking: Reported on 10/17/2021 05/24/21   Jeanell Sparrow, DO   ? ? ?Inpatient Medications: ?Scheduled Meds: ? heparin  5,000 Units Subcutaneous Q8H  ? ?Continuous Infusions: ? ?PRN Meds: ? ? ?Allergies:   No Known Allergies ? ?Social History:   ?Social History  ? ?Socioeconomic History  ? Marital status: Single  ?  Spouse name: Not on file  ? Number of children: Not on file  ? Years of education: Not on file  ? Highest education level: Not on file  ?Occupational History  ? Not on file  ?Tobacco Use  ? Smoking status: Some Days  ?  Types: Cigarettes  ? Smokeless tobacco: Never  ?Substance and Sexual Activity  ? Alcohol use: Not on file  ? Drug use: Never  ? Sexual activity: Not on file  ?Other Topics Concern  ? Not on file  ?Social History Narrative  ? Not on file  ? ?Social Determinants of Health  ? ?Financial Resource Strain: Not on file  ?Food Insecurity: Not on file  ?Transportation Needs: Not on file  ?Physical Activity: Not on file  ?Stress: Not on file  ?Social Connections: Not on file  ?Intimate Partner Violence: Not on file  ?  ?Family History:   ?Family History  ?Problem Relation Age of Onset  ? CAD Mother   ? Colon cancer Maternal Grandmother   ? Sudden death Cousin   ?  ? ?ROS:  ?Please see the history of present illness.  ?Review of Systems  ?Constitutional:  Negative for chills, fever and weight loss.  ?HENT:  Negative for congestion.   ?Respiratory:  Positive for shortness of breath. Negative for cough.   ?Cardiovascular:  Negative for chest pain, palpitations, orthopnea, leg swelling and PND.  ?Gastrointestinal:  Positive for blood in stool (hemorrhoids). Negative for melena, nausea and vomiting.  ?Genitourinary:  Negative for hematuria.  ?Musculoskeletal:  Negative for myalgias.  ?Neurological:  Positive for dizziness and loss of consciousness.  ?Endo/Heme/Allergies:  Does not bruise/bleed easily.  ?Psychiatric/Behavioral:  Positive for substance abuse (tobacco use).   ? ?Physical Exam/Data:  ? ?Vitals:  ? 10/17/21 2214 10/17/21 2346 10/18/21 0111 10/18/21 0509   ?BP: 131/85 131/84 133/82 119/72  ?Pulse: 80 66 71 68  ?Resp: 18 16 16 16   ?Temp:   98.8 ?F (37.1 ?C) 98.5 ?F (36.9 ?C)  ?TempSrc:   Oral Oral  ?SpO2: 97% 95% 98% 99%  ?Weight:   74.5 kg   ?Height:   5' 9"  (1.753 m)   ? ?No intake or output data in the 24 hours ending 10/18/21 1004 ? ?  10/18/2021  ?  1:11 AM 10/17/2021  ?  7:26 PM  ?Last 3 Weights  ?Weight (lbs) 164 lb 4.8 oz 170 lb  ?Weight (kg) 74.526 kg 77.111 kg  ?   ?Body mass index is 24.26 kg/m?.  ?General: 31 y.o. Caucasian male resting comfortably in no acute distress. ?HEENT: Normocephalic and atraumatic. Sclera clear.  ?Neck: Supple. No carotid bruits. No JVD. ?Heart: RRR. Distinct S1 and S2. No murmurs, gallops, or rubs. Radial pulses 2+ and  equal bilaterally. ?Lungs: No increased work of breathing. Clear to ausculation bilaterally. No wheezes, rhonchi, or rales.  ?Abdomen: Soft, non-distended, and non-tender to palpation. Bowel sounds present. ?MSK: Normal strength and tone for age. ?Extremities: No lower extremity edema.    ?Skin: Warm and dry. ?Neuro: Alert and oriented x3. No focal deficits. ?Psych: Normal affect. Responds appropriately. ? ? ?EKG:  The EKG was personally reviewed and demonstrates: Normal sinus rhythm, rate 87 bpm, with no acute ST/T changes.  Borderline short PR interval of 117 ms.  Right axis deviation.  QTc 412 ms. ? ?Telemetry:  Telemetry was personally reviewed and demonstrates: Normal sinus rhythm with rates in the 60s to 70s. ? ?Relevant CV Studies: ? ?Echo pending. ? ?Laboratory Data: ? ?High Sensitivity Troponin:   ?Recent Labs  ?Lab 10/17/21 ?2100 10/17/21 ?2305  ?TROPONINIHS 21* 16  ?   ?Chemistry ?Recent Labs  ?Lab 10/17/21 ?1936 10/18/21 ?0341  ?NA 140 138  ?K 3.8 4.0  ?CL 103 106  ?CO2 30 23  ?GLUCOSE 85 89  ?BUN 17 15  ?CREATININE 1.21 1.07  ?CALCIUM 9.2 9.1  ?GFRNONAA >60 >60  ?ANIONGAP 7 9  ?  ?Recent Labs  ?Lab 10/18/21 ?0341  ?PROT 8.0  ?ALBUMIN 3.8  ?AST 23  ?ALT 29  ?ALKPHOS 69  ?BILITOT 0.7  ? ?Lipids No results  for input(s): CHOL, TRIG, HDL, LABVLDL, LDLCALC, CHOLHDL in the last 168 hours.  ?Hematology ?Recent Labs  ?Lab 10/17/21 ?1936 10/18/21 ?0341  ?WBC 4.3 3.8*  ?RBC 4.76 4.38  ?HGB 13.8 12.6*  ?HCT 41.5 38.5*  ?MCV 8

## 2021-10-18 NOTE — Progress Notes (Signed)
Consult received for PIV consult requesting an 18g IV. Secure chat sent to nurse, Sophia RN. This patient is to transfer to [facility] in the early AM. Requested nurse to have the night nurse to re-consult VAST 1-2 hours before transfer. Nurse VU. Tomasita Morrow, RN VAST ?

## 2021-10-18 NOTE — Hospital Course (Addendum)
31 year old with no significant past medical history who presents to the ED complaining of loss of consciousness.  He has had 2 episode of loss of consciousness.  The episode that brought him to the hospital;  he was waiting standing in the line at the grocery store when he felt dizzy, left arm twitching and muscle of his neck and posterior head twitching, blackout. ? ?His mother and brother has history of seizure.  Also his mother died about 2 months ago of CAD.  He has a cousin who died suddenly when he was 66 year old. ? ?He was admitted for further evaluation.  Syncope is thought to be vasovagal in nature.  MRI negative, EEG negative.  Neurology evaluated patient and think patient's syncope episode could be related to vasovagal. ? ?Cardiology was also consulted due to high risk family history for CAD.  He will be discharged with plan for Holter monitor.  Coronary CTA results pending ?

## 2021-10-18 NOTE — Progress Notes (Signed)
?  Progress Note ? ? ?Patient: Gary Barrett FBP:[id number] DOB: 07-05-1991 DOA: 10/17/2021     0 ?DOS: the patient was seen and examined on 10/18/2021 ?  ?Brief hospital course: ?31 year old with no significant past medical history who presents to the ED complaining of loss of consciousness.  He has had 2 episode of loss of consciousness.  The episode that brought him to the hospital;  he was waiting standing in the line at the grocery store when he felt dizzy, left arm twitching and muscle of his neck and posterior head twitching, blackout. ? ?His mother and brother has history of seizure.  Also his mother died about 2 months ago of CAD.  He has a cousin who died suddenly when he was 6 year old. ? ?He was admitted for further evaluation.  ? ?Assessment and Plan: ?* Syncope ?-Patient presented after 2 syncope episode over the last week. ?-Do to strong family history for CAD, and report of SOB  cardiology has been consulted.  Patient might benefit from Holter monitor. ?Patient today reports a history of twitching of his left arm and back of his head prior to passing out.  Neurology will be consulted.  We will proceed with MRI of the brain and EEG. ?-D dimer negative. Troponin not significantly elevated.  ?-EKG: Sinus rhythm.  ? ?Diaphoresis ?Check TSH.  ? ? ? ? ? ?  ? ?Subjective:  ?He Report 2 syncope episodes.  ?He relates diaphoresis lately for last week.  ?Prior to episode he feels twitching of left arm, neck and back of his head. Then black out.  ? ?Physical Exam: ?Vitals:  ? 10/18/21 0509 10/18/21 1104 10/18/21 1105 10/18/21 1107  ?BP: 119/72 115/78 129/87 139/87  ?Pulse: 68 68 75 78  ?Resp: 16     ?Temp: 98.5 ?F (36.9 ?C)     ?TempSrc: Oral     ?SpO2: 99% 99% 99% 99%  ?Weight:      ?Height:      ? ?General; NAD ?CVS; S 1, S2  RRR ?Neuro; non focal.  ? ?Data Reviewed: ? ?Cbc and bmet ? ?Family Communication: care discussed with patient.  ? ?Disposition: ?Status is: Observation ?The patient remains OBS  appropriate and will d/c before 2 midnights. ? Planned Discharge Destination: Home ? ? ? ?Time spent: 35 minutes ? ?Author: ?Alba Cory, MD ?10/18/2021 2:11 PM ? ?For on call review [email or web address].  ?

## 2021-10-19 ENCOUNTER — Observation Stay (HOSPITAL_COMMUNITY): Payer: Self-pay

## 2021-10-19 ENCOUNTER — Encounter (HOSPITAL_COMMUNITY): Payer: Self-pay | Admitting: Internal Medicine

## 2021-10-19 DIAGNOSIS — Z7252 High risk homosexual behavior: Secondary | ICD-10-CM

## 2021-10-19 DIAGNOSIS — R55 Syncope and collapse: Secondary | ICD-10-CM

## 2021-10-19 DIAGNOSIS — Z114 Encounter for screening for human immunodeficiency virus [HIV]: Secondary | ICD-10-CM

## 2021-10-19 LAB — HIV-1/2 AB - DIFFERENTIATION
HIV 1 Ab: REACTIVE
HIV 2 Ab: NONREACTIVE

## 2021-10-19 LAB — CRYPTOCOCCAL ANTIGEN: Crypto Ag: NEGATIVE

## 2021-10-19 MED ORDER — IOHEXOL 350 MG/ML SOLN
100.0000 mL | Freq: Once | INTRAVENOUS | Status: AC | PRN
  Administered 2021-10-19: 100 mL via INTRAVENOUS

## 2021-10-19 MED ORDER — NITROGLYCERIN 0.4 MG SL SUBL
0.8000 mg | SUBLINGUAL_TABLET | Freq: Once | SUBLINGUAL | Status: AC
  Administered 2021-10-19: 0.8 mg via SUBLINGUAL

## 2021-10-19 NOTE — Progress Notes (Signed)
Discharge information provided to and reviewed with patient.  Patient verbalized understanding.  PIVs and cardiac monitoring removed.  Escorted patient to ED entrance with belongings for transport home via [place]. ? ?Angie Fava, RN  ?

## 2021-10-19 NOTE — Progress Notes (Signed)
Patient returned to room 1439 via [facility].  Vital signs within defined limits.  Patient denies lightheadness, N/V, pain. ? ?Bradd Burner, RN  ?

## 2021-10-19 NOTE — Discharge Summary (Signed)
Physician Discharge Summary   Patient: Gary Barrett MRN: [id number] DOB: 01/22/1991  Admit date:     10/17/2021  Discharge date: 10/19/21  Discharge Physician: Jon Billings A Gareth Fitzner   PCP: Pcp, No   Recommendations at discharge:    Needs to follow up with ID, for results of HIV confirmatory test, HIV RNA/  Follow up with cardiology for results of Holter monitor.   Discharge Diagnoses: Principal Problem:   Syncope Active Problems:   Diaphoresis   FH: sudden cardiac death (SCD)   FH: premature coronary heart disease   Encounter for screening for HIV   High risk homosexual behavior  Resolved Problems:   * No resolved hospital problems. *  Hospital Course: 31 year old with no significant past medical history who presents to the ED complaining of loss of consciousness.  He has had 2 episode of loss of consciousness.  The episode that brought him to the hospital;  31 years old left arm twitching and muscle of his neck and posterior head twitching, blackout.  His mother and brother has history of seizure.  Also his mother died about 2 months ago of CAD.  He has a cousin who died suddenly when he was 73 year old.  He was admitted for further evaluation.  Syncope is thought to be vasovagal in nature.  MRI negative, EEG negative.  Neurology evaluated patient and think patient's syncope episode could be related to vasovagal.  Cardiology was also consulted due to high risk family history for CAD.  He will be discharged with plan for Holter monitor.  Coronary CTA results pending  Assessment and Plan: * Syncope -Patient presented after 2 syncope episode over the last week. -Due  to strong family history for CAD, and report of SOB  cardiology has been consulted.  Cardiology will arrange Holter monitor.  Patient reports a history of twitching of his left arm and back of his head prior to passing out.  Neurology will be  consulted.  MRI and EEG negative. Neurology think syncope are vasovagal , but agrees with cardiology assessment for holter monitor.  -D dimer negative. Troponin not significantly elevated.  -EKG: Sinus rhythm.  Plan to discharge today, after CT results.   Encounter for screening for HIV HIV screen four  Generation reactive. ID has been consulted, plan is to wait for confirmatory test and HIV RNA quantitative. He will follow up with ID out patient.   FH: premature coronary heart disease Plan for coronary CT today. If normal plan for discharge today.   FH: sudden cardiac death (SCD) Cardiology will arrange Holter monitor.   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 Dr Elinor Parkinson talk to patient. Plan to follow up out patient.  Coronary CT normal.         Consultants: Cardiology, ID, neurology  Procedures performed: EEG, ECHO.  Disposition: Home Diet recommendation:  Cardiac diet DISCHARGE MEDICATION: Allergies as of 10/19/2021   No Known Allergies      Medication List     STOP taking these medications    ibuprofen 200 MG tablet Commonly known as: ADVIL   pramoxine 1 % foam Commonly known as: Proctofoam        Follow-up Information     Marjie Skiff E, PA-C Follow up.   Specialties: Physician Assistant, Cardiology Why: Hospital follow-up with Cardiology scheduled for 12/15/2021 at 9:15am. Please arrive 15 minutes early for check-in. If this date/time  does not work for you, please call our office to reschedule. Contact information: [address] [place] [address] [phone]         Odette Fraction, MD Follow up in 1 week(s).   Specialty: Infectious Diseases Contact information: [address] [place] [address] [phone]                Discharge Exam: Ceasar Mons Weights   10/17/21 1926 10/18/21 0111  Weight: 77.1 kg 74.5 kg   Subjective. He is anxious about test results.    General; NAD Lung; CTA Abdomen; soft, nt   Condition at discharge: stable  The results of significant diagnostics from this hospitalization (including imaging, microbiology, ancillary and laboratory) are listed below for reference.   Imaging Studies: DG Chest 2 View  Result Date: 10/17/2021 CLINICAL DATA:  Headache, syncopal episodes, shortness of breath EXAM: CHEST - 2 VIEW COMPARISON:  None. FINDINGS: Cardiac and mediastinal contours are within normal limits. No focal pulmonary opacity. No pleural effusion or pneumothorax. No acute osseous abnormality. IMPRESSION: No acute cardiopulmonary process. Electronically Signed   By: Wiliam Ke M.D.   On: 10/17/2021 21:39   CT Head Wo Contrast  Result Date: 10/17/2021 CLINICAL DATA:  Headache over the last week. EXAM: CT HEAD WITHOUT CONTRAST TECHNIQUE: Contiguous axial images were obtained from the base of the skull through the vertex without intravenous contrast. RADIATION DOSE REDUCTION: This exam was performed according to the departmental dose-optimization program which includes automated exposure control, adjustment of the mA and/or kV according to patient size and/or use of iterative reconstruction technique. COMPARISON:  None. FINDINGS: Brain: No evidence of acute infarction, hemorrhage, hydrocephalus, extra-axial collection or mass lesion/mass effect. Vascular: No hyperdense vessel or unexpected calcification. Skull: Normal. Negative for fracture or focal lesion. Sinuses/Orbits: No acute finding. Other: None. IMPRESSION: No acute intracranial process. Electronically Signed   By: Romona Curls M.D.   On: 10/17/2021 20:10   MR BRAIN W WO CONTRAST  Result Date: 10/18/2021 CLINICAL DATA:  Seizure, new event. No history of trauma. Headache. EXAM: MRI HEAD WITHOUT AND WITH CONTRAST TECHNIQUE: Multiplanar, multiecho pulse sequences of the brain and surrounding structures were obtained without and with intravenous contrast. CONTRAST:  7.54mL  GADAVIST GADOBUTROL 1 MMOL/ML IV SOLN COMPARISON:  Head CT from yesterday FINDINGS: Brain: No infarction, hemorrhage, hydrocephalus, extra-axial collection or mass lesion. No abnormal enhancement, white matter disease, or evidence of cortical malformation. Symmetric, unremarkable hippocampus. Vascular: Normal flow voids and vascular enhancements. 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 Skull and upper cervical spine: Normal marrow signal Sinuses/Orbits: Retention cyst appearance in the left paramedian nasopharynx, 12 mm in diameter. IMPRESSION: No explanation for seizure. 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 Electronically Signed   By: Tiburcio Pea M.D.   On: 10/18/2021 12:54   CT CORONARY MORPH W/CTA COR W/SCORE W/CA W/CM &/OR WO/CM  Result Date: 10/19/2021 EXAM: OVER-READ INTERPRETATION  CT CHEST The following report is an over-read performed by radiologist Dr. Allegra Lai of [facility] on 10/19/2021. This over-read does not include interpretation of cardiac or coronary anatomy or pathology. The coronary calcium score/coronary CTA interpretation by the cardiologist is attached. COMPARISON:  None. FINDINGS: Vascular: Normal heart size. No pericardial effusion. No suspicious filling defects of the visualized central pulmonary arteries. Normal caliber thoracic aorta with no atherosclerotic disease. Mediastinum/Nodes: Esophagus is unremarkable. No enlarged lymph nodes seen in the chest. Lungs/Pleura: Central airways are patent. Mild left basilar atelectasis.  No consolidation, pleural effusion or pneumothorax. Upper Abdomen: No acute abnormality. Musculoskeletal: No chest wall mass or suspicious bone lesions identified. IMPRESSION: No acute extracardiac abnormality. Electronically Signed   By: Allegra Lai M.D.   On: 10/19/2021 12:18   EEG adult  Result Date: 10/18/2021 Charlsie Quest, MD     10/18/2021  5:25 PM Patient Name:  ARLAN BIRKS MRN: [id number] Epilepsy Attending: Charlsie Quest Referring Physician/Provider: Eduard Clos, MD Date: 10/18/2021 Duration: 24.14 mins Patient history: 31 y.o. male with no significant past medical history presenting with headache and 3 episodes of loss of consciousness over the last 3 weeks.He feels like his head was bobbing slightly during the episode and feels like he had some twitching in the left arm and neck but this would resolve if he touched his neck or arm. EEG to evaluate for seizure Level of alertness: Awake AEDs during EEG study: None Technical aspects: This EEG study was done with scalp electrodes positioned according to the 10-20 International system of electrode placement. Electrical activity was acquired at a sampling rate of 500Hz  and reviewed with a high frequency filter of 70Hz  and a low frequency filter of 1Hz . EEG data were recorded continuously and digitally stored. Description: The posterior dominant rhythm consists of 9-10 Hz activity of moderate voltage (25-35 uV) seen predominantly in posterior head regions, symmetric and reactive to eye opening and eye closing. Hyperventilation and photic stimulation were not performed.   IMPRESSION: This study is within normal limits. No seizures or epileptiform discharges were seen throughout the recording. Charlsie Quest   ECHOCARDIOGRAM COMPLETE  Result Date: 10/18/2021    ECHOCARDIOGRAM REPORT   Patient Name:   PENNY FRISBIE Date of Exam: 10/18/2021 Medical Rec #:  [id number]          Height:       69.0 in Accession #:    [id number]         Weight:       164.3 lb Date of Birth:  1991-07-05         BSA:          1.900 m Patient Age:    30 years           BP:           119/72 mmHg Patient Gender: M                  HR:           67 bpm. Exam Location:  Inpatient Procedure: 2D Echo, Cardiac Doppler and Color Doppler Indications:    Syncope  History:        Patient has no prior history of Echocardiogram examinations.   Sonographer:    Cleatis Polka Referring Phys: [phone] ARSHAD N KAKRAKANDY IMPRESSIONS  1. Left ventricular ejection fraction, by estimation, is 60 to 65%. The left ventricle has normal function. The left ventricle has no regional wall motion abnormalities. Left ventricular diastolic parameters were normal.  2. Right ventricular systolic function is normal. The right ventricular size is normal.  3. The mitral valve is normal in structure. Trivial mitral valve regurgitation. No evidence of mitral stenosis.  4. The aortic valve is tricuspid. Aortic valve regurgitation is not visualized. No aortic stenosis is present.  5. The inferior vena cava is normal in size with greater than 50% respiratory variability, suggesting right atrial pressure of 3 mmHg. Comparison(s): No prior Echocardiogram. FINDINGS  Left Ventricle: Left ventricular ejection fraction, by estimation,  is 60 to 65%. The left ventricle has normal function. The left ventricle has no regional wall motion abnormalities. The left ventricular internal cavity size was normal in size. There is  no left ventricular hypertrophy. Left ventricular diastolic parameters were normal. Right Ventricle: The right ventricular size is normal. Right ventricular systolic function is normal. Left Atrium: Left atrial size was normal in size. Right Atrium: Right atrial size was normal in size. Pericardium: There is no evidence of pericardial effusion. Mitral Valve: The mitral valve is normal in structure. Trivial mitral valve regurgitation. No evidence of mitral valve stenosis. Tricuspid Valve: The tricuspid valve is normal in structure. Tricuspid valve regurgitation is trivial. No evidence of tricuspid stenosis. Aortic Valve: The aortic valve is tricuspid. Aortic valve regurgitation is not visualized. No aortic stenosis is present. Aortic valve peak gradient measures 6.4 mmHg. Pulmonic Valve: The pulmonic valve was normal in structure. Pulmonic valve regurgitation is not visualized.  No evidence of pulmonic stenosis. Aorta: The aortic root is normal in size and structure. Venous: The inferior vena cava is normal in size with greater than 50% respiratory variability, suggesting right atrial pressure of 3 mmHg. IAS/Shunts: No atrial level shunt detected by color flow Doppler.  LEFT VENTRICLE PLAX 2D LVIDd:         4.90 cm      Diastology LVIDs:         3.20 cm      LV e' medial:    15.20 cm/s LV PW:         1.10 cm      LV E/e' medial:  6.5 LV IVS:        1.00 cm      LV e' lateral:   20.20 cm/s LVOT diam:     2.20 cm      LV E/e' lateral: 4.9 LV SV:         63 LV SV Index:   33 LVOT Area:     3.80 cm  LV Volumes (MOD) LV vol d, MOD A2C: 136.0 ml LV vol d, MOD A4C: 140.0 ml LV vol s, MOD A2C: 49.3 ml LV vol s, MOD A4C: 52.9 ml LV SV MOD A2C:     86.7 ml LV SV MOD A4C:     140.0 ml LV SV MOD BP:      89.7 ml RIGHT VENTRICLE             IVC RV Basal diam:  3.50 cm     IVC diam: 2.00 cm RV Mid diam:    2.80 cm RV S prime:     13.10 cm/s TAPSE (M-mode): 2.4 cm LEFT ATRIUM             Index        RIGHT ATRIUM           Index LA diam:        2.80 cm 1.47 cm/m   RA Area:     16.30 cm LA Vol (A2C):   29.4 ml 15.47 ml/m  RA Volume:   45.60 ml  24.00 ml/m LA Vol (A4C):   34.4 ml 18.10 ml/m LA Biplane Vol: 34.2 ml 18.00 ml/m  AORTIC VALVE AV Area (Vmax): 2.48 cm AV Vmax:        126.00 cm/s AV Peak Grad:   6.4 mmHg LVOT Vmax:      82.20 cm/s LVOT Vmean:     54.200 cm/s LVOT VTI:       0.167 m  AORTA Ao Root diam: 3.20 cm Ao Asc diam:  2.70 cm MITRAL VALVE MV Area (PHT): 4.68 cm    SHUNTS MV Decel Time: 162 msec    Systemic VTI:  0.17 m MV E velocity: 99.40 cm/s  Systemic Diam: 2.20 cm MV A velocity: 39.80 cm/s MV E/A ratio:  2.50 Olga Millers MD Electronically signed by Olga Millers MD Signature Date/Time: 10/18/2021/12:09:05 PM    Final     Microbiology: No results found for this or any previous visit.  Labs: CBC: Recent Labs  Lab 10/17/21 1936 10/18/21 0341  WBC 4.3 3.8*  HGB 13.8  12.6*  HCT 41.5 38.5*  MCV 87.2 87.9  PLT 184 156   Basic Metabolic Panel: Recent Labs  Lab 10/17/21 1936 10/18/21 0341  NA 140 138  K 3.8 4.0  CL 103 106  CO2 30 23  GLUCOSE 85 89  BUN 17 15  CREATININE 1.21 1.07  CALCIUM 9.2 9.1   Liver Function Tests: Recent Labs  Lab 10/18/21 0341  AST 23  ALT 29  ALKPHOS 69  BILITOT 0.7  PROT 8.0  ALBUMIN 3.8   CBG: Recent Labs  Lab 10/17/21 2015  GLUCAP 97    Discharge time spent: greater than 30 minutes.  Signed: Alba Cory, MD [facility] 10/19/2021

## 2021-10-19 NOTE — Progress Notes (Signed)
100 mg metoprolol administered at 1033 (3/28). ? ?Angie Fava, RN  ?

## 2021-10-19 NOTE — Progress Notes (Signed)
Report given to [facility], ? ?Bradd Burner, RN  ?

## 2021-10-19 NOTE — Consult Note (Signed)
? ? ?[facility]  ?                                                                                     ? ?Patient Identification: ?Patient Name: Gary Barrett MRN: [id number] Admit Date: 10/17/2021  7:23 PM ?Today's Date: 10/19/2021 ?Reason for consult: Screening test positive for HIV ?Requesting provider:  Hartley Barefoot  ? ?Principal Problem: ?  Syncope ?Active Problems: ?  Diaphoresis ?  FH: sudden cardiac death (SCD) ?  FH: premature coronary heart disease ? ? ?Antibiotics:  ?None ? ?Lines/Hardware: ? 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 ?# Recurrent syncope  ?Cardiology following. Negative MRI brain. TTE unremarkable. Plan for event monitor. Possible Vasovagal syncope per cardio  ? ?# Family h/o premature CAD and sudden cardiac death  ? ?#  Headache/Twitching in the left arm/neck ?Negative EEG. MRI brain unremarkable. Neurology following ? ?Recommendations  ?Will fu HIV ag/ab differentiation assay  ? ?Added on HIV RNA to increase sensitivity if there is any possibility of acute infection ? ?I have discussed with him that we will need to wait for the confirmatory test to come back before diagnosis of HIV can be made.  ? ?Will follow up in the clinic in next few days to week to discuss his HIV results. I expect results would be back by then since he is planned for discharge today.  ? ?He is understandably very anxious at this time given he has to wait for test results. ? ?ID available as needed. Please call with questions  ?D/w patient and his RN ? ?Rest of the management as per the primary team. Please call with questions or concerns.  ?Thank you for the consult ? ?Odette Fraction, MD ?Infectious Disease Physician ?[facility] ?[address] ?[place], [place]  [address] ?Phone: [phone]  Fax: [phone] ? ?__________________________________________________________________________________________________________ ?HPI and Hospital Course: ?31 Y O Male with no significant PMH who presented to the ED on 3/27 with multiple episodes of syncope, 3 times last week with episodes of diaphoresis and drenching sweats. He is getting worked up with Cardiology team for possible cardiogenic cause for syncope. Neurology is also following him for evaluation of headache and twitching.  ? ?Called by Dr Sunnie Nielsen that patient has reactive Hiv ag/ab test and talk to the patient as someone told him that he has HIV.  The HIV 1 and 2 differentiation assay is currently in process and I have added HIV RNA test for expediting test.  ? ?Denies prior h/o HIV diagnosis or being on ART.  ?He reports to be sexually active with male and recently had oral sexual encounter with a male ( not his partner). Drinks alcohol occasionally and denies any h/o IVDU. He has started smoking recently. Denies recent fevers and chills but has drenching sweats. Appetite is good, does not think he has lost weight. Denies cough, chest pain but has exertional SOB. Denies any joint pain/swelling/rashes. Denies any GU symptoms. Has retroorbital headache which he thinks is related to migrane.  Has blurry vision when he is about to pass out but nothing otherwise. Denies any recent sorethroat/lymph node swelling  ? ?AT ED, afebrile, WBC 3.8 ?Labs unremarkable  ? ?ROS: all systems reviewed and pertinent positives and negatives as listed above  ? ?History reviewed. No pertinent past medical history. ? ?Past Surgical History:  ?Procedure Laterality Date  ? INNER EAR SURGERY    ? ? ?Scheduled Meds: ? heparin  5,000 Units Subcutaneous Q8H  ? ?Continuous Infusions: ? sodium chloride 75 mL/hr at 10/19/21 0158  ? ?PRN Meds:. ? ?No Known Allergies ? ?Social History  ? ?Socioeconomic History  ? Marital status: Single  ?  Spouse name: Not  on file  ? Number of children: Not on file  ? Years of education: Not on file  ? Highest education level: Not on file  ?Occupational History  ? Not on file  ?Tobacco Use  ? Smoking status: Some Days  ?  Types: Cigarettes  ? Smokeless tobacco: Never  ?Substance and Sexual Activity  ? Alcohol use: Not on file  ? Drug use: Never  ? Sexual activity: Not on file  ?Other Topics Concern  ? Not on file  ?Social History Narrative  ? Not on file  ? ?Social Determinants of Health  ? ?Financial Resource Strain: Not on file  ?Food Insecurity: Not on file  ?Transportation Needs: Not on file  ?Physical Activity: Not on file  ?Stress: Not on file  ?Social Connections: Not on file  ?Intimate Partner Violence: Not on file  ? ?Breast Cancer-relatedfamily history is not on file. ? ?Vitals ?BP 122/79 (BP Location: Left Arm)   Pulse 79   Temp 97.6 ?F (36.4 ?C) (Oral)   Resp 18   Ht 5\' 9"  (1.753 m)   Wt 74.5 kg   SpO2 99%   BMI 24.26 kg/m?  ? ? ?Physical Exam ?Constitutional:  sitting up in the bed, appears comfortable ?   Comments:  ? ?Cardiovascular:  ?   Rate and Rhythm: Normal rate and regular rhythm.  ?   Heart sounds:  ? ?Pulmonary:  ?   Effort: Pulmonary effort is normal on room air ?   Comments: Bilateral clear air entry  ? ?Abdominal:  ?   Palpations: Abdomen is soft.  ?   Tenderness: non tender and non distended  ? ?Musculoskeletal:     ?   General: No swelling or tenderness.  ? ?Skin: ?   Comments: No obvious rashes or lesions ? ?Neurological:  ?   General: grossly non focal; awake, alert and oriented * 3  ? ?Psychiatric:     ?   Mood and Affect: Mood normal.  ? ? ?Pertinent Microbiology ?No results found for this or any previous visit. ? ? ?Pertinent Lab seen by me: ? ?  Latest Ref Rng & Units 10/18/2021  ?  3:41 AM 10/17/2021  ?  7:36 PM 05/24/2021  ?  2:42 PM  ?CBC  ?WBC 4.0 - 10.5 K/uL 3.8   4.3   8.8    ?Hemoglobin 13.0 - 17.0 g/dL 05/26/2021   61.4   43.1    ?Hematocrit 39.0 - 52.0 % 38.5   41.5   41.9    ?Platelets 150 -  400 K/uL 156   184   203    ? ? ?  Latest Ref Rng & Units 10/18/2021  ?  3:41 AM 10/17/2021  ?  7:36 PM 05/24/2021  ?  2:42 PM  ?CMP  ?Glucose 70 -  99 mg/dL 89   85   85    ?BUN 6 - 20 mg/dL 15   17   8     ?Creatinine 0.61 - 1.24 mg/dL 4.091.07   8.111.21   9.140.79    ?Sodium 135 - 145 mmol/L 138   140   139    ?Potassium 3.5 - 5.1 mmol/L 4.0   3.8   3.7    ?Chloride 98 - 111 mmol/L 106   103   102    ?CO2 22 - 32 mmol/L 23   30   27     ?Calcium 8.9 - 10.3 mg/dL 9.1   9.2   9.4    ?Total Protein 6.5 - 8.1 g/dL 8.0    8.9    ?Total Bilirubin 0.3 - 1.2 mg/dL 0.7    0.6    ?Alkaline Phos 38 - 126 U/L 69    67    ?AST 15 - 41 U/L 23    32    ?ALT 0 - 44 U/L 29    31    ? ? ? ?Pertinent Imagings/Other Imagings ?Plain films and CT images have been personally visualized and interpreted; radiology reports have been reviewed. Decision making incorporated into the Impression / Recommendations. ? ?DG Chest 2 View ? ?Result Date: 10/17/2021 ?CLINICAL DATA:  Headache, syncopal episodes, shortness of breath EXAM: CHEST - 2 VIEW COMPARISON:  None. FINDINGS: Cardiac and mediastinal contours are within normal limits. No focal pulmonary opacity. No pleural effusion or pneumothorax. No acute osseous abnormality. IMPRESSION: No acute cardiopulmonary process. Electronically Signed   By: Wiliam KeAlison  Vasan M.D.   On: 10/17/2021 21:39  ? ?CT Head Wo Contrast ? ?Result Date: 10/17/2021 ?CLINICAL DATA:  Headache over the last week. EXAM: CT HEAD WITHOUT CONTRAST TECHNIQUE: Contiguous axial images were obtained from the base of the skull through the vertex without intravenous contrast. RADIATION DOSE REDUCTION: This exam was performed according to the departmental dose-optimization program which includes automated exposure control, adjustment of the mA and/or kV according to patient size and/or use of iterative reconstruction technique. COMPARISON:  None. FINDINGS: Brain: No evidence of acute infarction, hemorrhage, hydrocephalus, extra-axial collection or mass  lesion/mass effect. Vascular: No hyperdense vessel or unexpected calcification. Skull: Normal. Negative for fracture or focal lesion. Sinuses/Orbits: No acute finding. Other: None. IMPRESSION: No acute intracranial pr

## 2021-10-19 NOTE — Assessment & Plan Note (Signed)
Cardiology will arrange Holter monitor.  ?

## 2021-10-19 NOTE — Assessment & Plan Note (Signed)
HIV screen four  Generation reactive. ?ID has been consulted, plan is to wait for confirmatory test and HIV RNA quantitative. ?He will follow up with ID out patient.  ?

## 2021-10-19 NOTE — Assessment & Plan Note (Signed)
Plan for coronary CT today. If normal plan for discharge today.  ?

## 2021-10-19 NOTE — Progress Notes (Signed)
ID Brief Note  ? ?I was informed by his RN later in the day  that HIV 1 ab came back reactive ? ?I called him and discussed his results over phone as he is getting discharged today.  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 I discussed with him that will make an appointment in [facility] as soon as possible to get him started on ART.  ? ?I went over his previous reported symptoms of headache again to see if any concerns for crypto meningitis. He says headache is behind his eyes, 5-6 /10, on and off for last 3 weeks. He thinks it is probably migrane. Denies any blurry vision currently. Denies nausea, vomiting. No new numbness and weakness.  ? ?He is agreeable to visit in the clinic for ART  ?I will also add on Cryptococcal antigen in his labs ? ?He says he will go home today at any cost.  I discussed with him in case he has worsening headache/blurry vision or new neurological symptoms, he needs to come back to the hospital  ? ?D/w RN and Dr Sunnie Nielsen. ? ?Odette Fraction, MD ?Infectious Disease Physician ?[facility] ?[address] ?[place], [place] [address] ?Phone: [phone]  Fax: [phone] ? ?

## 2021-10-19 NOTE — Plan of Care (Signed)

## 2021-10-19 NOTE — Progress Notes (Signed)
? ?Progress Note ? ?Patient Name: Gary Barrett ?Date of Encounter: 10/19/2021 ? ?[facility] Cardiologist: New (Dr. Duke Salvia) ? ?Subjective  ? ?No acute overnight events. He reports recurrent diaphoresis overnight but this is not new. No recurrent lightheadedness/dizziness or syncope. No chest pain or shortness of breath. Plan is for coronary CTA today. ? ?While I was in the room, RN notified patient that his HIV screening came back positive. Patient became tearful and asked RN to leave the room. I also offered to leave the room if patient wanted to give him time to process this which he said yes to. Patient was upset with how news was relayed to him by the RN and initially wanted to leave. I returned to speak with patient along with the charge nurse and nurse manager - we all apologized for how news was delivered. I explained to patient the reasoning for the coronary CTA but offered to do this as an outpatient if patient would prefer. However, he agreed to stay and go ahead and have it done here. ? ?Inpatient Medications  ?  ?Scheduled Meds: ? heparin  5,000 Units Subcutaneous Q8H  ? metoprolol tartrate  50-100 mg Oral Once  ? ?Continuous Infusions: ? sodium chloride 75 mL/hr at 10/19/21 0158  ? ?PRN Meds: ?  ? ?Vital Signs  ?  ?Vitals:  ? 10/18/21 1105 10/18/21 1107 10/18/21 2015 10/19/21 0434  ?BP: 129/87 139/87 130/72 122/79  ?Pulse: 75 78 74 63  ?Resp:   16 18  ?Temp:   98.4 ?F (36.9 ?C) 97.6 ?F (36.4 ?C)  ?TempSrc:   Oral Oral  ?SpO2: 99% 99% 98% 99%  ?Weight:      ?Height:      ? ? ?Intake/Output Summary (Last 24 hours) at 10/19/2021 0951 ?Last data filed at 10/19/2021 [phone] ?Gross per 24 hour  ?Intake 2605.48 ml  ?Output --  ?Net 2605.48 ml  ? ? ?  10/18/2021  ?  1:11 AM 10/17/2021  ?  7:26 PM  ?Last 3 Weights  ?Weight (lbs) 164 lb 4.8 oz 170 lb  ?Weight (kg) 74.526 kg 77.111 kg  ?   ? ?Telemetry  ?  ?Sinus rhythm. Baseline rates in the high 50s to 70s. He is tachycardic this morning in the setting of  being very upset after receiving news that his HIV screen was positive. - Personally Reviewed ? ?ECG  ?  ?No new ECG tracing today. - Personally Reviewed ? ?Physical Exam  ? ?GEN: No acute distress.   ?Neck: No JVD. ?Cardiac: Tachycardic with normal rhythm. No murmurs, rubs, or gallops. Radial pulses and distal pedal pulses 2+ and equal bilaterally. ?Respiratory: Clear to auscultation bilaterally. No wheezes, rhonchi, or rales. ?GI: Soft, non-distended, and non-tender. ?MS: No lower extremity edema. No deformity. ?Skin: Warm and dry. ?Neuro:  No focal deficits. ?Psych: Normal affect. Responds appropriately. ? ?Labs  ?  ?High Sensitivity Troponin:   ?Recent Labs  ?Lab 10/17/21 ?2100 10/17/21 ?2305  ?TROPONINIHS 21* 16  ?   ?Chemistry ?Recent Labs  ?Lab 10/17/21 ?1936 10/18/21 ?0341  ?NA 140 138  ?K 3.8 4.0  ?CL 103 106  ?CO2 30 23  ?GLUCOSE 85 89  ?BUN 17 15  ?CREATININE 1.21 1.07  ?CALCIUM 9.2 9.1  ?PROT  --  8.0  ?ALBUMIN  --  3.8  ?AST  --  23  ?ALT  --  29  ?ALKPHOS  --  69  ?BILITOT  --  0.7  ?GFRNONAA >60 >60  ?ANIONGAP 7  9  ?  ?Lipids  ?Recent Labs  ?Lab 10/18/21 ?1033  ?CHOL 167  ?TRIG 127  ?HDL 21*  ?LDLCALC 121*  ?CHOLHDL 8.0  ?  ?Hematology ?Recent Labs  ?Lab 10/17/21 ?1936 10/18/21 ?0341  ?WBC 4.3 3.8*  ?RBC 4.76 4.38  ?HGB 13.8 12.6*  ?HCT 41.5 38.5*  ?MCV 87.2 87.9  ?MCH 29.0 28.8  ?MCHC 33.3 32.7  ?RDW 13.7 13.7  ?PLT 184 156  ? ?Thyroid  ?Recent Labs  ?Lab 10/18/21 ?1031  ?TSH 2.204  ?  ?BNP ?Recent Labs  ?Lab 10/17/21 ?2100  ?BNP 13.8  ?  ?DDimer  ?Recent Labs  ?Lab 10/17/21 ?2100  ?DDIMER <0.27  ?  ? ?Radiology  ?  ?DG Chest 2 View ? ?Result Date: 10/17/2021 ?CLINICAL DATA:  Headache, syncopal episodes, shortness of breath EXAM: CHEST - 2 VIEW COMPARISON:  None. FINDINGS: Cardiac and mediastinal contours are within normal limits. No focal pulmonary opacity. No pleural effusion or pneumothorax. No acute osseous abnormality. IMPRESSION: No acute cardiopulmonary process. Electronically Signed   By:  Wiliam Ke M.D.   On: 10/17/2021 21:39  ? ?CT Head Wo Contrast ? ?Result Date: 10/17/2021 ?CLINICAL DATA:  Headache over the last week. EXAM: CT HEAD WITHOUT CONTRAST TECHNIQUE: Contiguous axial images were obtained from the base of the skull through the vertex without intravenous contrast. RADIATION DOSE REDUCTION: This exam was performed according to the departmental dose-optimization program which includes automated exposure control, adjustment of the mA and/or kV according to patient size and/or use of iterative reconstruction technique. COMPARISON:  None. FINDINGS: Brain: No evidence of acute infarction, hemorrhage, hydrocephalus, extra-axial collection or mass lesion/mass effect. Vascular: No hyperdense vessel or unexpected calcification. Skull: Normal. Negative for fracture or focal lesion. Sinuses/Orbits: No acute finding. Other: None. IMPRESSION: No acute intracranial process. Electronically Signed   By: Romona Curls M.D.   On: 10/17/2021 20:10  ? ?MR BRAIN W WO CONTRAST ? ?Result Date: 10/18/2021 ?CLINICAL DATA:  Seizure, new event. No history of trauma. Headache. EXAM: MRI HEAD WITHOUT AND WITH CONTRAST TECHNIQUE: Multiplanar, multiecho pulse sequences of the brain and surrounding structures were obtained without and with intravenous contrast. CONTRAST:  7.61mL GADAVIST GADOBUTROL 1 MMOL/ML IV SOLN COMPARISON:  Head CT from yesterday FINDINGS: Brain: No infarction, hemorrhage, hydrocephalus, extra-axial collection or mass lesion. No abnormal enhancement, white matter disease, or evidence of cortical malformation. Symmetric, unremarkable hippocampus. Vascular: Normal flow voids and vascular enhancements. 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 Skull and upper cervical spine: Normal marrow signal Sinuses/Orbits: Retention cyst appearance in the left paramedian nasopharynx, 12 mm in diameter. IMPRESSION: No explanation for seizure. 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 Electronically Signed   By: Tiburcio Pea M.D.   On: 10/18/2021 12:54  ? ?EEG adult ? ?Result Date: 10/18/2021 ?Charlsie Quest, MD     10/18/2021  5:25 PM Patient Name: Gary Barrett MRN: [id number] Epilepsy Attending: Charlsie Quest Referring Physician/Provider: Eduard Clos, MD Date: 10/18/2021 Duration: 24.14 mins Patient history: 31 y.o. male with no significant past medical history presenting with headache and 3 episodes of loss of consciousness over the last 3 weeks.He feels like his head was bobbing slightly during the episode and feels like he had some twitching in the left arm and neck but this would resolve if he touched his neck or arm. EEG to evaluate for seizure Level of alertness: Awake AEDs during EEG study: None Technical aspects: This EEG study was done with  scalp electrodes positioned according to the 10-20 International system of electrode placement. Electrical activity was acquired at a sampling rate of 500Hz  and reviewed with a high frequency filter of 70Hz  and a low frequency filter of 1Hz . EEG data were recorded continuously and digitally stored. Description: The posterior dominant rhythm consists of 9-10 Hz activity of moderate voltage (25-35 uV) seen predominantly in posterior head regions, symmetric and reactive to eye opening and eye closing. Hyperventilation and photic stimulation were not performed.   IMPRESSION: This study is within normal limits. No seizures or epileptiform discharges were seen throughout the recording. Priyanka Annabelle Harman Yadav  ? ?ECHOCARDIOGRAM COMPLETE ? ?Result Date: 10/18/2021 ?   ECHOCARDIOGRAM REPORT   Patient Name:   Gary Barrett Date of Exam: 10/18/2021 Medical Rec #:  [id number]          Height:       69.0 in Accession #:    [id number]         Weight:       164.3 lb Date of Birth:  07/11/1991         BSA:          1.900 m? Patient Age:    30 years           BP:           119/72 mmHg Patient Gender: M                  HR:            67 bpm. Exam Location:  Inpatient Procedure: 2D Echo, Cardiac Doppler and Color Doppler Indications:    Syncope  History:        Patient has no prior history of Echocardiogram examinations.  Sonographer:

## 2021-10-20 LAB — HIV-1 RNA QUANT-NO REFLEX-BLD
HIV 1 RNA Quant: 40500 copies/mL
LOG10 HIV-1 RNA: 4.607 log10copy/mL

## 2021-10-21 ENCOUNTER — Encounter: Payer: Self-pay | Admitting: Family

## 2021-10-21 ENCOUNTER — Ambulatory Visit (INDEPENDENT_AMBULATORY_CARE_PROVIDER_SITE_OTHER): Payer: Self-pay | Admitting: Family

## 2021-10-21 ENCOUNTER — Other Ambulatory Visit: Payer: Self-pay

## 2021-10-21 ENCOUNTER — Other Ambulatory Visit: Payer: Self-pay | Admitting: Family

## 2021-10-21 ENCOUNTER — Other Ambulatory Visit (HOSPITAL_COMMUNITY): Payer: Self-pay

## 2021-10-21 ENCOUNTER — Ambulatory Visit: Payer: Self-pay

## 2021-10-21 ENCOUNTER — Ambulatory Visit (INDEPENDENT_AMBULATORY_CARE_PROVIDER_SITE_OTHER): Payer: Self-pay | Admitting: Pharmacist

## 2021-10-21 VITALS — BP 126/86 | HR 82 | Temp 99.0°F | Wt 169.0 lb

## 2021-10-21 DIAGNOSIS — B20 Human immunodeficiency virus [HIV] disease: Secondary | ICD-10-CM | POA: Insufficient documentation

## 2021-10-21 DIAGNOSIS — Z113 Encounter for screening for infections with a predominantly sexual mode of transmission: Secondary | ICD-10-CM

## 2021-10-21 MED ORDER — DOVATO 50-300 MG PO TABS: 1.0000 | ORAL_TABLET | Freq: Every day | ORAL | 3 refills | Status: DC

## 2021-10-21 NOTE — Progress Notes (Signed)
? ?  HPI: Gary Barrett is a 31 y.o. male who presents to the [facility] clinic today to initiate care for a newly diagnosed HIV infection. ? ?Patient Active Problem List  ? Diagnosis Date Noted  ? HIV disease ([place]) 10/21/2021  ? Encounter for screening for HIV   ? High risk homosexual behavior   ? Syncope 2021-11-08  ? Diaphoresis 2021-11-08  ? FH: sudden cardiac death (SCD)   ? FH: premature coronary heart disease   ? ? ?Patient's Medications  ?New Prescriptions  ? DOLUTEGRAVIR-LAMIVUDINE (DOVATO) 50-300 MG TABLET    Take 1 tablet by mouth daily.  ?Previous Medications  ? No medications on file  ?Modified Medications  ? No medications on file  ?Discontinued Medications  ? No medications on file  ? ? ?Allergies: ?No Known Allergies ? ?Past Medical History: ?No past medical history on file. ? ?Social History: ?Social History  ? ?Socioeconomic History  ? Marital status: Single  ?  Spouse name: Not on file  ? Number of children: Not on file  ? Years of education: 79  ? Highest education level: Not on file  ?Occupational History  ? Not on file  ?Tobacco Use  ? Smoking status: Some Days  ?  Packs/day: 0.10  ?  Types: Cigarettes  ? Smokeless tobacco: Never  ?Vaping Use  ? Vaping Use: Never used  ?Substance and Sexual Activity  ? Alcohol use: Yes  ?  Alcohol/week: 1.0 standard drink  ?  Types: 1 Glasses of wine per week  ?  Comment: occ  ? Drug use: Yes  ?  Types: Marijuana  ?  Comment: occ  ? Sexual activity: Not on file  ?Other Topics Concern  ? Not on file  ?Social History Narrative  ? Not on file  ? ?Social Determinants of Health  ? ?Financial Resource Strain: Not on file  ?Food Insecurity: Not on file  ?Transportation Needs: Not on file  ?Physical Activity: Not on file  ?Stress: Not on file  ?Social Connections: Not on file  ? ? ?Labs: ?Lab Results  ?Component Value Date  ? HIV1RNAQUANT 40,500 10/19/2021  ? ? ?RPR and STI ?No results found for: LABRPR, RPRTITER ? ?   ? View : No data to display.  ?  ?  ?   ? ? ?Hepatitis B ?No results found for: HEPBSAB, HEPBSAG, HEPBCAB ?Hepatitis C ?No results found for: [place], HCVRNAPCRQN ?Hepatitis A ?No results found for: HAV ?Lipids: ?Lab Results  ?Component Value Date  ? CHOL 167 11/08/21  ? TRIG 127 11-08-21  ? HDL 21 (L) November 08, 2021  ? CHOLHDL 8.0 Nov 08, 2021  ? VLDL 25 2021/11/08  ? LDLCALC 121 (H) 11/08/2021  ? ? ?Current HIV Regimen: ?Treatment naive ? ?Assessment: ?Gary Barrett is here today to initiate care with Terri Piedra for newly diagnosed HIV infection.  Gary Barrett is treatment naive with an initial HIV viral load of 16109  No resistance mutations found on initial genotype. Will start patient on Dovato. Counseled him on adherence, what to do if he misses a dose, and self limiting side effects (HA, fatigue, GI upset). He was given samples today in clinic.  ? ?Plan: ?Start Dovato  ?Counseled patient on Dovato ? ?Thank you for allowing pharmacy to be apart of this patient's care ? ?Asher Muir, PharmD Candidate ?

## 2021-10-21 NOTE — Progress Notes (Signed)
? ? ?Brief Narrative  ? ?Patient ID: Gary Barrett, male    DOB: [birth year], 31 y.o.   MRN: [id number] ? ?Mr. Courtwright is a 31 y/o male with HIV disease diagnosed on 10/18/21 with risk factor of MSM. Initial viral load of 40,500 with CD4 count and initial lab work pending. Treatment naive and started on Dovato.  ? ?Subjective:  ?  ?Chief Complaint  ?Patient presents with  ? Follow-up  ? ? ?HPI: ? ?Gary Barrett is a 31 y.o. male with no significant previous medical history presenting today for newly diagnosed HIV-1 disease.  ? ?Gary Barrett was recently hospitalized for suspected syncope with strong family history of CAD. During routine HIV screening was found to have a positive initial test with subsequent viral load of 40,500 and positive HIV-1 antibody test. Last negative HIV test was 1.5 years ago. Risk factor for HIV is MSM. Has been overall healthy and currently feeling well. Denies fevers, chills, night sweats, headaches, changes in vision, neck pain/stiffness, nausea, diarrhea, vomiting, lesions or rashes. ? ?Gary Barrett is approved for [employer] and awaiting approval of UMAP/ADAP. Denies feelings of being down, depressed or hopeless. Drinks alcohol and smoke marijuana on occasion with tobacco use of about 1 pack per week. Has been in touch with [facility] for assistance.  ? ?No Known Allergies ? ? ? ?No outpatient medications prior to visit.  ? ?No facility-administered medications prior to visit.  ? ? ? ?History reviewed. No pertinent past medical history. ? ? ?Past Surgical History:  ?Procedure Laterality Date  ? INNER EAR SURGERY    ? ? ? ? ?Review of Systems  ?Constitutional:  Negative for appetite change, chills, fatigue, fever and unexpected weight change.  ?Eyes:  Negative for visual disturbance.  ?Respiratory:  Negative for cough, chest tightness, shortness of breath and wheezing.   ?Cardiovascular:  Negative for chest pain and leg swelling.  ?Gastrointestinal:   Negative for abdominal pain, constipation, diarrhea, nausea and vomiting.  ?Genitourinary:  Negative for dysuria, flank pain, frequency, genital sores, hematuria and urgency.  ?Skin:  Negative for rash.  ?Allergic/Immunologic: Negative for immunocompromised state.  ?Neurological:  Negative for dizziness and headaches.  ?   ?Objective:  ?  ?BP 126/86   Pulse 82   Temp 99 ?F (37.2 ?C) (Oral)   Wt 169 lb (76.7 kg)   SpO2 98%   BMI 24.96 kg/m?  ?Nursing note and vital signs reviewed. ? ?Physical Exam ?Constitutional:   ?   General: He is not in acute distress. ?   Appearance: He is well-developed.  ?Eyes:  ?   Conjunctiva/sclera: Conjunctivae normal.  ?Cardiovascular:  ?   Rate and Rhythm: Normal rate and regular rhythm.  ?   Heart sounds: Normal heart sounds. No murmur heard. ?  No friction rub. No gallop.  ?Pulmonary:  ?   Effort: Pulmonary effort is normal. No respiratory distress.  ?   Breath sounds: Normal breath sounds. No wheezing or rales.  ?Chest:  ?   Chest wall: No tenderness.  ?Abdominal:  ?   General: Bowel sounds are normal.  ?   Palpations: Abdomen is soft.  ?   Tenderness: There is no abdominal tenderness.  ?Musculoskeletal:  ?   Cervical back: Neck supple.  ?Lymphadenopathy:  ?   Cervical: No cervical adenopathy.  ?Skin: ?   General: Skin is warm and dry.  ?   Findings: No rash.  ?Neurological:  ?   Mental Status: He  is alert and oriented to person, place, and time.  ?Psychiatric:     ?   Behavior: Behavior normal.     ?   Thought Content: Thought content normal.     ?   Judgment: Judgment normal.  ? ? ? ?   ? View : No data to display.  ?  ?  ?  ?  ?   ?Assessment & Plan:  ? ? ?Patient Active Problem List  ? Diagnosis Date Noted  ? HIV disease ([place]) 10/21/2021  ? Encounter for screening for HIV   ? High risk homosexual behavior   ? Syncope 2021-11-17  ? Diaphoresis 11-17-2021  ? FH: sudden cardiac death (SCD)   ? FH: premature coronary heart disease   ? ? ? ?Problem List Items Addressed This Visit    ? ?  ? Other  ? HIV disease ([place]) - Primary  ?  Gary Barrett is a 31 y/o male with newly diagnosed HIV-1 disease found during recent hospitalization screening with risk factor of MSM. No history of opportunistic infection. Initial viral load of 40,500. Discussed the pathogenesis, transmission, risk if left untreated, treatment options and assistance programs available. Will start Dovato with sample provided and recorded in pharmacy log while awaiting UMAP/ADAP approval. Check initial lab work today.  Met with pharmacy staff today. Plan for follow up in 1 month or sooner if needed with lab work on the same day.  ?  ?  ? Relevant Medications  ? dolutegravir-lamiVUDine (DOVATO) 50-300 MG tablet  ? Other Relevant Orders  ? HEPATITIS B SURFACE ANTIGEN  ? HEPATITIS B SURFACE ANTIBODY  ? HEPATITIS B CORE ANTIBODY, TOTAL  ? HEPATITIS C ANTIBODY  ? HEPATITIS A ANTIBODY, TOTAL  ? URINALYSIS  ? HIV-1 RNA ULTRAQUANT REFLEX TO GENTYP+  ? HLA [id number]  ? QuantiFERON-TB Gold Plus  ? T-helper cell (CD4)- ([facility] clinic only)  ? ?Other Visit Diagnoses   ? ? Screening for STDs (sexually transmitted diseases)      ? Relevant Orders  ? RPR  ? ?  ? ? ? ?I am having Kelvin Sennett. He start on Dovato. ? ? ?Meds ordered this encounter  ?Medications  ? dolutegravir-lamiVUDine (DOVATO) 50-300 MG tablet  ?  Sig: Take 1 tablet by mouth daily.  ?  Dispense:  30 tablet  ?  Refill:  3  ?  Awaiting UMAP - please fill once approved.  ?  Order Specific Question:   Supervising Provider  ?  Answer:   Carlyle Basques [[id number]]  ? ? ? ?Follow-up: Return in about 1 month (around 11/21/2021), or if symptoms worsen or fail to improve. ? ? ?Terri Piedra, MSN, FNP-C ?Nurse Practitioner ?[place] [facility] ?[facility] ?[facility] Main number: [phone] ? ? ?

## 2021-10-21 NOTE — Assessment & Plan Note (Addendum)
Gary Barrett is a 31 y/o male with newly diagnosed HIV-1 disease found during recent hospitalization screening with risk factor of MSM. No history of opportunistic infection. Initial viral load of 40,500. Discussed the pathogenesis, transmission, risk if left untreated, treatment options and assistance programs available. Will start Dovato with sample provided and recorded in pharmacy log while awaiting UMAP/ADAP approval. Check initial lab work today.  Met with pharmacy staff today. Plan for follow up in 1 month or sooner if needed with lab work on the same day.  ?

## 2021-10-21 NOTE — Patient Instructions (Signed)
Nice to see you. ? ?We will check your lab work today. ? ?Start taking your medication daily as prescribed. ? ?Refills will be sent to the pharmacy. ? ?Plan for follow up in 1 months or sooner if needed with lab work on the same day. ? ?Have a great day and stay safe! ? ?

## 2021-10-22 LAB — URINALYSIS
Bilirubin Urine: NEGATIVE
Glucose, UA: NEGATIVE
Hgb urine dipstick: NEGATIVE
Ketones, ur: NEGATIVE
Leukocytes,Ua: NEGATIVE
Nitrite: NEGATIVE
Specific Gravity, Urine: 1.029 (ref 1.001–1.035)
pH: 6 (ref 5.0–8.0)

## 2021-10-24 LAB — T-HELPER CELL (CD4) - (RCID CLINIC ONLY)
CD4 % Helper T Cell: 29 % — ABNORMAL LOW (ref 33–65)
CD4 T Cell Abs: 199 /uL — ABNORMAL LOW (ref 400–1790)

## 2021-10-25 ENCOUNTER — Telehealth: Payer: Self-pay | Admitting: Family

## 2021-10-25 ENCOUNTER — Other Ambulatory Visit: Payer: Self-pay | Admitting: Pharmacist

## 2021-10-25 DIAGNOSIS — B20 Human immunodeficiency virus [HIV] disease: Secondary | ICD-10-CM

## 2021-10-25 MED ORDER — DOVATO 50-300 MG PO TABS: 1.0000 | ORAL_TABLET | Freq: Every day | ORAL | 0 refills | Status: DC

## 2021-10-25 NOTE — Telephone Encounter (Signed)
Attempted to call Mr. Arnell at the list number and was unable to reach him and no voicemail picked up. MyChart message sent.  ? ?Mr. Bresee's blood work was positive for syphilis with a titter of 1:2048 and will need 3 weekly injections of 2.4 million units of Bicillin IM. Please call to schedule for treatment. Thanks.  ?

## 2021-10-25 NOTE — Progress Notes (Signed)
Medication Samples have been provided to the patient. ? ?Drug name: Dovato        ?Strength: 50/300 mg         ?Qty: 28 tablets (2 bottles)   ?LOT: [id number]   ?Exp.Date: 03/26/23 ? ?Dosing instructions: Take one tablet by mouth once daily ? ?The patient has been instructed regarding the correct time, dose, and frequency of taking this medication, including desired effects and most common side effects.  ? ?Royetta Probus L. Smita Lesh, PharmD, BCIDP, AAHIVP, CPP ?Clinical Pharmacist Practitioner ?Infectious Diseases Clinical Pharmacist ?[facility] ?07/06/2020, 10:07 AM  ?

## 2021-10-25 NOTE — Telephone Encounter (Signed)
I spoke to the patient and advise him of positive RPR results and let him know he will need treatment for syphilis. Patient advised to let his partner know so they can be tested and treated as well, no sex until completing treatment and an additional 10 days after completing treatment. Patient verbalized understanding and scheduled for treatment.  ?

## 2021-10-28 ENCOUNTER — Other Ambulatory Visit: Payer: Self-pay

## 2021-10-28 ENCOUNTER — Ambulatory Visit (INDEPENDENT_AMBULATORY_CARE_PROVIDER_SITE_OTHER): Payer: Self-pay

## 2021-10-28 DIAGNOSIS — A539 Syphilis, unspecified: Secondary | ICD-10-CM

## 2021-10-28 MED ORDER — PENICILLIN G BENZATHINE 1200000 UNIT/2ML IM SUSY
1.2000 10*6.[IU] | PREFILLED_SYRINGE | Freq: Once | INTRAMUSCULAR | Status: AC
  Administered 2021-10-28: 1.2 10*6.[IU] via INTRAMUSCULAR

## 2021-10-28 NOTE — Progress Notes (Signed)
Verified patient using two identifiers and reviewed and confirmed allergies. Bicillin injections administered per order with chaperone present Reymundo Poll, CMA); patient tolerated well. Educated patient to remain abstinent until treatment completed, plus an additional 10 days. Condoms offered and use encouraged. Advised patient to notify sexual partners for testing and treatment. Questions encouraged and answered. Patient verbalized understanding. Patient stated follow up appointments were already made. ? ?Gary Lenz, RN  ?

## 2021-10-29 ENCOUNTER — Ambulatory Visit (INDEPENDENT_AMBULATORY_CARE_PROVIDER_SITE_OTHER): Payer: Self-pay

## 2021-10-29 DIAGNOSIS — R55 Syncope and collapse: Secondary | ICD-10-CM

## 2021-11-04 ENCOUNTER — Other Ambulatory Visit: Payer: Self-pay

## 2021-11-04 ENCOUNTER — Ambulatory Visit (INDEPENDENT_AMBULATORY_CARE_PROVIDER_SITE_OTHER): Payer: Self-pay

## 2021-11-04 DIAGNOSIS — A539 Syphilis, unspecified: Secondary | ICD-10-CM

## 2021-11-04 LAB — FLUORESCENT TREPONEMAL AB(FTA)-IGG-BLD: Fluorescent Treponemal ABS: REACTIVE — AB

## 2021-11-04 LAB — HEPATITIS A ANTIBODY, TOTAL: Hepatitis A AB,Total: REACTIVE — AB

## 2021-11-04 LAB — HLA B*5701: HLA-B*5701 w/rflx HLA-B High: NEGATIVE

## 2021-11-04 LAB — QUANTIFERON-TB GOLD PLUS
Mitogen-NIL: 8.44 IU/mL
NIL: 0.05 IU/mL
QuantiFERON-TB Gold Plus: NEGATIVE
TB1-NIL: 0 IU/mL
TB2-NIL: 0 IU/mL

## 2021-11-04 LAB — HIV-1 RNA ULTRAQUANT REFLEX TO GENTYP+
HIV 1 RNA Quant: 46000 copies/mL — ABNORMAL HIGH
HIV-1 RNA Quant, Log: 4.66 Log copies/mL — ABNORMAL HIGH

## 2021-11-04 LAB — HEPATITIS C ANTIBODY
Hepatitis C Ab: NONREACTIVE
SIGNAL TO CUT-OFF: 0.1 (ref <1.00)

## 2021-11-04 LAB — HEPATITIS B SURFACE ANTIBODY,QUALITATIVE: Hep B S Ab: REACTIVE — AB

## 2021-11-04 LAB — HEPATITIS B SURFACE ANTIGEN: Hepatitis B Surface Ag: NONREACTIVE

## 2021-11-04 LAB — HIV-1 GENOTYPE: HIV-1 Genotype: DETECTED — AB

## 2021-11-04 LAB — HEPATITIS B CORE ANTIBODY, TOTAL: Hep B Core Total Ab: NONREACTIVE

## 2021-11-04 LAB — RPR: RPR Ser Ql: REACTIVE — AB

## 2021-11-04 LAB — RPR TITER: RPR Titer: 1:2048 {titer} — ABNORMAL HIGH

## 2021-11-04 MED ORDER — PENICILLIN G BENZATHINE 1200000 UNIT/2ML IM SUSY
1.2000 10*6.[IU] | PREFILLED_SYRINGE | Freq: Once | INTRAMUSCULAR | Status: AC
  Administered 2021-11-04: 1.2 10*6.[IU] via INTRAMUSCULAR

## 2021-11-04 NOTE — Progress Notes (Signed)
Verified patient using two identifiers and reviewed and confirmed allergies. Bicillin injections administered per order with chaperone present; patient tolerated well. Condoms offered and use encouraged. Reinforced to remain abstinent until treatment completed, plus an additional 10 days. Patient aware to notify sexual partners for testing and treatment. Questions encouraged. Confirmed that third bicillin appointment was scheduled. Patient verbalized understanding.  ? ?Wyvonne Lenz, RN  ?

## 2021-11-08 ENCOUNTER — Other Ambulatory Visit (HOSPITAL_COMMUNITY): Payer: Self-pay

## 2021-11-08 ENCOUNTER — Telehealth: Payer: Self-pay

## 2021-11-08 NOTE — Telephone Encounter (Signed)
We can switch his medication to [facility] if he is continuing to have adverse side effects with the Dovato. [place] for samples until I see him next week or sooner if needed.  ?

## 2021-11-08 NOTE — Telephone Encounter (Signed)
Patient would like to try Biktarvy. He will come by tomorrow to pick up samples. He is scheduled with you 11/17/21. ?Gary Barrett Gary Barrett ? ?

## 2021-11-08 NOTE — Telephone Encounter (Signed)
Patient called complaining of increased tremors and NVD since starting the Dovato. Patient stating his tremors have become more noticeable. I did also advise the patient that he needs to also reach out to his neurologist regarding the tremors.  ?Please advise. ?Cina Klumpp T Jakyria Bleau ? ?

## 2021-11-11 ENCOUNTER — Ambulatory Visit (INDEPENDENT_AMBULATORY_CARE_PROVIDER_SITE_OTHER): Payer: Self-pay

## 2021-11-11 ENCOUNTER — Other Ambulatory Visit: Payer: Self-pay

## 2021-11-11 ENCOUNTER — Other Ambulatory Visit: Payer: Self-pay | Admitting: Pharmacist

## 2021-11-11 DIAGNOSIS — A539 Syphilis, unspecified: Secondary | ICD-10-CM

## 2021-11-11 DIAGNOSIS — B20 Human immunodeficiency virus [HIV] disease: Secondary | ICD-10-CM

## 2021-11-11 MED ORDER — PENICILLIN G BENZATHINE 1200000 UNIT/2ML IM SUSY
2.4000 10*6.[IU] | PREFILLED_SYRINGE | Freq: Once | INTRAMUSCULAR | Status: AC
  Administered 2021-11-11: 2.4 10*6.[IU] via INTRAMUSCULAR

## 2021-11-11 MED ORDER — BICTEGRAVIR-EMTRICITAB-TENOFOV 50-200-25 MG PO TABS: 1.0000 | ORAL_TABLET | Freq: Every day | ORAL | 0 refills | Status: DC

## 2021-11-11 NOTE — Progress Notes (Signed)
Medication Samples have been provided to the patient. ? ?Drug name: Biktarvy        ?Strength: 50/200/25 mg       ?Qty: 14 tablets (2 bottles) ?LOT: [id number]   ?Exp.Date: 10/24 ? ?Dosing instructions: Take one tablet by mouth once daily ? ?The patient has been instructed regarding the correct time, dose, and frequency of taking this medication, including desired effects and most common side effects.  ? ?Aryam Zhan, PharmD, CPP ?Clinical Pharmacist Practitioner ?Infectious Diseases Clinical Pharmacist ?[facility] ? ?

## 2021-11-11 NOTE — Progress Notes (Addendum)
Reviewed and verified allergies with patient. Patient tolerated bicillin 2.4 MU IM injection well. Chaperone present during administration. Reinforced abstinence for 10 days after treatment, offered condoms and encouraged use. Advised patient to notify sexual partners for testing and treatment. Patient verbalized understanding.   ?Gary Barrett ? ?

## 2021-11-17 ENCOUNTER — Ambulatory Visit (INDEPENDENT_AMBULATORY_CARE_PROVIDER_SITE_OTHER): Payer: Self-pay | Admitting: Family

## 2021-11-17 ENCOUNTER — Encounter: Payer: Self-pay | Admitting: Family

## 2021-11-17 ENCOUNTER — Other Ambulatory Visit: Payer: Self-pay

## 2021-11-17 VITALS — BP 124/77 | HR 83 | Temp 98.6°F | Wt 169.0 lb

## 2021-11-17 DIAGNOSIS — Z0000 Encounter for general adult medical examination without abnormal findings: Secondary | ICD-10-CM | POA: Insufficient documentation

## 2021-11-17 DIAGNOSIS — B20 Human immunodeficiency virus [HIV] disease: Secondary | ICD-10-CM

## 2021-11-17 DIAGNOSIS — K649 Unspecified hemorrhoids: Secondary | ICD-10-CM

## 2021-11-17 MED ORDER — HYDROCORTISONE (PERIANAL) 2.5 % EX CREA: 1.0000 "application " | TOPICAL_CREAM | Freq: Two times a day (BID) | CUTANEOUS | 0 refills | Status: DC

## 2021-11-17 MED ORDER — BICTEGRAVIR-EMTRICITAB-TENOFOV 50-200-25 MG PO TABS: 1.0000 | ORAL_TABLET | Freq: Every day | ORAL | 3 refills | Status: DC

## 2021-11-17 NOTE — Assessment & Plan Note (Signed)
Gary Barrett has been doing well since starting Biktarvy and after his first month of medication.  We reviewed previous lab work and discussed plan of care.  Check blood work today.  Continue current dose of Biktarvy.  He will need to renew financial assistance after July 1.  Plan for follow-up in 1 month or sooner if needed with lab work on the same day. ?

## 2021-11-17 NOTE — Assessment & Plan Note (Signed)
?   Discussed importance of safe sexual practice and condom usage. Condoms offered.  ?? Discuss vaccinations at next office visit.  ?

## 2021-11-17 NOTE — Assessment & Plan Note (Signed)
Mr. Macke has symptoms consistent with hemorrhoids refractory to over-the-counter medications.  Start proctozone. Discussed anal hygiene and sitz baths. Has follow up with GI for possible intervention. Follow up if symptoms worsen or do not improve.  ?

## 2021-11-17 NOTE — Progress Notes (Signed)
? ? ?Brief Narrative  ? ?Patient ID: Gary Barrett, male    DOB: 08-12-1990, 31 y.o.   MRN: [place]:[id number] ? ?Mr. 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 Initial viral load was 40,500 with CD4 count of 199. Genotype with no significant medication resistant mutations. No history of opportunistic infection. [id number] negative. Entered care at [facility] Stage 3. Changed from [place] (N/V/D) to [place].  ? ?Subjective:  ?  ?Chief Complaint  ?Patient presents with  ? Follow-up  ?  B20 -   ? ? ?HPI: ? ?Gary Barrett is a 31 y.o. male with HIV idsease initially seen on 10/21/21 with newly diagnosed virus with initial viral load of 48,000 and CD4 count of 199. [id number] and Quantiferon Gold negative. Immune to Hepatitis B and no infection with Hepatitis C. RPR for syphilis as positive at 1:2048. Treated with 3 weekly injections of Bicillin IM. Genotype detected with no significant medication resistance mutations. Initially started on Dovato however was having nausea, vomiting and diarrhea and was changed to [place]. Here today for first month follow up. ? ?Mr. Gary Barrett has been taking Biktarvy daily as prescribed initially with some nausea which improved after 2 days of medication.  Now tolerating well.  Feeling well today with concern for hemorrhoid that he has follow-up with gastroenterology for.  Has been refractory to over-the-counter medications and continues to have burning and itching. Denies fevers, chills, night sweats, headaches, changes in vision, neck pain/stiffness, diarrhea, vomiting, lesions or rashes. ? ?Mr. Gary Barrett has no problems obtaining medication from the pharmacy.  Denies feelings of being down, depressed, or hopeless recently.  Drinks alcohol and smokes marijuana on occasion with some day tobacco use.  Condoms offered. ? ? ?No Known Allergies ? ? ? ?Outpatient Medications Prior to Visit  ?Medication Sig Dispense Refill  ?  bictegravir-emtricitabine-tenofovir AF (BIKTARVY) 50-200-25 MG TABS tablet Take 1 tablet by mouth daily for 14 days. 14 tablet 0  ? ?No facility-administered medications prior to visit.  ? ? ? ?History reviewed. No pertinent past medical history. ? ? ?Past Surgical History:  ?Procedure Laterality Date  ? INNER EAR SURGERY    ? ? ? ? ?Review of Systems  ?Constitutional:  Negative for appetite change, chills, fatigue, fever and unexpected weight change.  ?Eyes:  Negative for visual disturbance.  ?Respiratory:  Negative for cough, chest tightness, shortness of breath and wheezing.   ?Cardiovascular:  Negative for chest pain and leg swelling.  ?Gastrointestinal:  Negative for abdominal pain, constipation, diarrhea, nausea and vomiting.  ?Genitourinary:  Negative for dysuria, flank pain, frequency, genital sores, hematuria and urgency.  ?Skin:  Negative for rash.  ?Allergic/Immunologic: Negative for immunocompromised state.  ?Neurological:  Negative for dizziness and headaches.  ?   ?Objective:  ?  ?BP 124/77   Pulse 83   Temp 98.6 ?F (37 ?C) (Oral)   Wt 169 lb (76.7 kg)   SpO2 98%   BMI 24.96 kg/m?  ?Nursing note and vital signs reviewed. ? ?Physical Exam ?Constitutional:   ?   General: He is not in acute distress. ?   Appearance: He is well-developed.  ?Eyes:  ?   Conjunctiva/sclera: Conjunctivae normal.  ?Cardiovascular:  ?   Rate and Rhythm: Normal rate and regular rhythm.  ?   Heart sounds: Normal heart sounds. No murmur heard. ?  No friction rub. No gallop.  ?Pulmonary:  ?   Effort: Pulmonary effort is normal. No respiratory distress.  ?  Breath sounds: Normal breath sounds. No wheezing or rales.  ?Chest:  ?   Chest wall: No tenderness.  ?Abdominal:  ?   General: Bowel sounds are normal.  ?   Palpations: Abdomen is soft.  ?   Tenderness: There is no abdominal tenderness.  ?Musculoskeletal:  ?   Cervical back: Neck supple.  ?Lymphadenopathy:  ?   Cervical: No cervical adenopathy.  ?Skin: ?   General: Skin is  warm and dry.  ?   Findings: No rash.  ?Neurological:  ?   Mental Status: He is alert and oriented to person, place, and time.  ?Psychiatric:     ?   Behavior: Behavior normal.     ?   Thought Content: Thought content normal.     ?   Judgment: Judgment normal.  ? ? ? ?   ? View : No data to display.  ?  ?  ?  ?  ?   ?Assessment & Plan:  ? ? ?Patient Active Problem List  ? Diagnosis Date Noted  ? Hemorrhoids 11/17/2021  ? Healthcare maintenance 11/17/2021  ? HIV disease ([place]) 10/21/2021  ? Encounter for screening for HIV   ? High risk homosexual behavior   ? Syncope 11-12-21  ? Diaphoresis Nov 12, 2021  ? FH: sudden cardiac death (SCD)   ? FH: premature coronary heart disease   ? ? ? ?Problem List Items Addressed This Visit   ? ?  ? Cardiovascular and Mediastinum  ? Hemorrhoids  ?  [name] has symptoms consistent with hemorrhoids refractory to over-the-counter medications.  Start proctozone. Discussed anal hygiene and sitz baths. Has follow up with GI for possible intervention. Follow up if symptoms worsen or do not improve.  ? ?  ?  ?  ? Other  ? HIV disease ([place]) - Primary  ?  [name] has been doing well since starting Biktarvy and after his first month of medication.  We reviewed previous lab work and discussed plan of care.  Check blood work today.  Continue current dose of Biktarvy.  He will need to renew financial assistance after July 1.  Plan for follow-up in 1 month or sooner if needed with lab work on the same day. ? ?  ?  ? Relevant Medications  ? bictegravir-emtricitabine-tenofovir AF (BIKTARVY) 50-200-25 MG TABS tablet  ? Other Relevant Orders  ? T-helper cell (CD4)- ([facility] clinic only)  ? HIV-1 RNA quant-no reflex-bld  ? Healthcare maintenance  ?  Discussed importance of safe sexual practice and condom usage. Condoms offered.  ?Discuss vaccinations at next office visit.  ?  ?  ? ? ? ?I have changed Gary Barrett. [place]'s bictegravir-emtricitabine-tenofovir AF. I am also having him start on  hydrocortisone. ? ? ?Meds ordered this encounter  ?Medications  ? hydrocortisone (PROCTOZONE-HC) 2.5 % rectal cream  ?  Sig: Place 1 application. rectally 2 (two) times daily.  ?  Dispense:  30 g  ?  Refill:  0  ?  Order Specific Question:   Supervising Provider  ?  Answer:   Carlyle Basques [[id number]]  ? bictegravir-emtricitabine-tenofovir AF (BIKTARVY) 50-200-25 MG TABS tablet  ?  Sig: Take 1 tablet by mouth daily.  ?  Dispense:  30 tablet  ?  Refill:  3  ?  Order Specific Question:   Supervising Provider  ?  Answer:   Carlyle Basques [[id number]]  ?  Order Specific Question:   Lot Number?  ?  Answer:   [id number]  ?  Order Specific Question:   Expiration Date?  ?  Answer:   05/25/2023  ? ? ? ?Follow-up: Return in about 1 month (around 12/17/2021), or if symptoms worsen or fail to improve. ? ? ?Terri Piedra, MSN, FNP-C ?Nurse Practitioner ?[place] [facility] ?[facility] ?[facility] Main number: [phone] ? ? ?

## 2021-11-17 NOTE — Patient Instructions (Signed)
Nice to see you.  We will check your lab work today.  Continue to take your medication daily as prescribed.  Medication has been sent to the pharmacy.  Plan for follow up in 1 months or sooner if needed with lab work on the same day.  Have a great day and stay safe!  

## 2021-11-18 ENCOUNTER — Ambulatory Visit: Payer: Self-pay | Admitting: Family

## 2021-11-19 LAB — HELPER T-LYMPH-CD4 (ARMC ONLY)
% CD 4 Pos. Lymph.: 33.9 % (ref 30.8–58.5)
Absolute CD 4 Helper: 509 /uL (ref 359–1519)
Basophils Absolute: 0 10*3/uL (ref 0.0–0.2)
Basos: 0 %
EOS (ABSOLUTE): 0.2 10*3/uL (ref 0.0–0.4)
Eos: 3 %
Hematocrit: 43.5 % (ref 37.5–51.0)
Hemoglobin: 14.4 g/dL (ref 13.0–17.7)
Immature Grans (Abs): 0 10*3/uL (ref 0.0–0.1)
Immature Granulocytes: 0 %
Lymphocytes Absolute: 1.5 10*3/uL (ref 0.7–3.1)
Lymphs: 23 %
MCH: 30.2 pg (ref 26.6–33.0)
MCHC: 33.1 g/dL (ref 31.5–35.7)
MCV: 91 fL (ref 79–97)
Monocytes Absolute: 0.5 10*3/uL (ref 0.1–0.9)
Monocytes: 8 %
Neutrophils Absolute: 4.3 10*3/uL (ref 1.4–7.0)
Neutrophils: 66 %
Platelets: 218 10*3/uL (ref 150–450)
RBC: 4.77 x10E6/uL (ref 4.14–5.80)
RDW: 14.7 % (ref 11.6–15.4)
WBC: 6.5 10*3/uL (ref 3.4–10.8)

## 2021-11-19 LAB — T-HELPER CELL (CD4) - (RCID CLINIC ONLY)

## 2021-11-20 LAB — HIV-1 RNA QUANT-NO REFLEX-BLD
HIV 1 RNA Quant: 26 copies/mL — ABNORMAL HIGH
HIV-1 RNA Quant, Log: 1.41 Log copies/mL — ABNORMAL HIGH

## 2021-11-23 ENCOUNTER — Telehealth: Payer: Self-pay | Admitting: Pharmacist

## 2021-11-23 ENCOUNTER — Other Ambulatory Visit: Payer: Self-pay

## 2021-11-23 ENCOUNTER — Other Ambulatory Visit: Payer: Self-pay | Admitting: Pharmacist

## 2021-11-23 DIAGNOSIS — B20 Human immunodeficiency virus [HIV] disease: Secondary | ICD-10-CM

## 2021-11-23 DIAGNOSIS — R110 Nausea: Secondary | ICD-10-CM

## 2021-11-23 MED ORDER — ONDANSETRON HCL 4 MG PO TABS: 4.0000 mg | ORAL_TABLET | Freq: Three times a day (TID) | ORAL | 0 refills | Status: DC | PRN

## 2021-11-23 MED ORDER — BICTEGRAVIR-EMTRICITAB-TENOFOV 50-200-25 MG PO TABS: 1.0000 | ORAL_TABLET | Freq: Every day | ORAL | 3 refills | Status: DC

## 2021-11-23 NOTE — Telephone Encounter (Signed)
Patient called today stating he is experiencing projectile "yellowish" vomiting after taking Biktarvy each day. He states he has tried taking it with food or without food without any change. He is worried the Tylenol is causing this in combination with Biktarvy. States this reaction is much worse than when he took Dovato previously. ? ?Reviewed that Susanne Borders can lead to this "start-up syndrome" over the first few weeks after starting treatment. Discussed that the symptoms resolve over time as his body adjusts. He is willing to continue taking Biktarvy with additional changes until he sees Tammy Sours at the end of May. ? ?He is agreeable to trialing Zofran 4mg  30 minutes to 1 hour prior to Biktarvy to prevent nausea/vomiting. Sent to on [place] with his Biktarvy refill. He will also trial ibuprofen for his hemorrhoids along with the hydrocortisone cream. Suggested trialing an OTC lidocaine cream as well for relief. Stated he can call back if he has any further questions or issues.  ? ?[place], PharmD, CPP ?Clinical Pharmacist Practitioner ?Infectious Diseases Clinical Pharmacist ?[facility] ? ?

## 2021-11-24 ENCOUNTER — Telehealth: Payer: Self-pay | Admitting: *Deleted

## 2021-11-24 NOTE — Telephone Encounter (Signed)
Awesome! Thank you. Will he need a NV or OV? ?

## 2021-11-24 NOTE — Telephone Encounter (Signed)
Pt was seen in [facility] ED and was referred to a GI. He has since moved from [place] to [place] [employer]). He wants to cancel his OV with [place] and come here. Can we accept him as a new patient? He is aware that we are scheduling into July and is ok with that.  ?

## 2021-11-29 ENCOUNTER — Other Ambulatory Visit: Payer: Self-pay | Admitting: Pharmacist

## 2021-11-29 ENCOUNTER — Telehealth: Payer: Self-pay

## 2021-11-29 DIAGNOSIS — R110 Nausea: Secondary | ICD-10-CM

## 2021-11-29 MED ORDER — ONDANSETRON HCL 4 MG PO TABS: 4.0000 mg | ORAL_TABLET | Freq: Three times a day (TID) | ORAL | 0 refills | Status: DC | PRN

## 2021-11-29 NOTE — Telephone Encounter (Signed)
Patient called complaining of pain and swelling around anus. Stated he was using hydrocortisone as recently prescribed, which helped with pain, but that he noticed this morning that he had two black "holes" above his anus. He discontinued the hydrocortisone approximately 24 hours ago. Stated he is now in 9/10 pain in the rectal area, and is having difficulty passing stool. Stated area is raw/ bleeds intermittently, and that he passed approximately 1 inch stool this morning, the first in about 48 hours. Denied fever or any additional symptoms. ? ?Patient stated he was scheduled for a GI referral in about 2 weeks. This RN counseled patient to call GI and see if they would be able to see the patient any sooner. If not, recommended the patient visit an urgent care provider or ED for assessment. ? ?Patient stated understanding and agreement. ? ?Wyvonne Lenz, RN  ?

## 2021-12-01 ENCOUNTER — Ambulatory Visit: Payer: Medicaid Other | Admitting: Internal Medicine

## 2021-12-07 ENCOUNTER — Telehealth: Payer: Self-pay

## 2021-12-07 ENCOUNTER — Encounter: Payer: Self-pay | Admitting: Family

## 2021-12-07 ENCOUNTER — Ambulatory Visit (INDEPENDENT_AMBULATORY_CARE_PROVIDER_SITE_OTHER): Payer: Self-pay | Admitting: Family

## 2021-12-07 ENCOUNTER — Other Ambulatory Visit: Payer: Self-pay

## 2021-12-07 VITALS — BP 130/75 | HR 90 | Temp 98.5°F | Resp 16 | Wt 169.0 lb

## 2021-12-07 DIAGNOSIS — K5903 Drug induced constipation: Secondary | ICD-10-CM

## 2021-12-07 DIAGNOSIS — K5900 Constipation, unspecified: Secondary | ICD-10-CM | POA: Insufficient documentation

## 2021-12-07 DIAGNOSIS — K649 Unspecified hemorrhoids: Secondary | ICD-10-CM

## 2021-12-07 NOTE — Assessment & Plan Note (Signed)
Mr. Corvin appears to have worsening constipation that is likely related to starting Zofran in the setting of previous constipation. Discussed limiting Zofran as able. Increase hydration and will start OTC regimen with Ducolox or Magnesium citrate. Consider additional laxatives if symptoms worsen or do not improve.  ?

## 2021-12-07 NOTE — Assessment & Plan Note (Signed)
Chronic issue with hemorrhoids. No thrombosed hemorrhoid. Continue Preparation H suppositories and sitz baths as needed. Has appointment with GI in 3 weeks. ?

## 2021-12-07 NOTE — Progress Notes (Signed)
? ? ?Patient ID: Gary Barrett, male    DOB: 06-10-1991, 31 y.o.   MRN: [id number] ? ?Subjective:  ?  ?Chief Complaint  ?Patient presents with  ? Follow-up  ?  Constipation, rectal pain, and hemorrhoids x 5 days  ? ? ?HPI: ? ?AYAAN Barrett is a 31 y.o. male with HIV disease presenting today for an acute office visit.  ? ?Mr. Rucinski has continued to have constipation, rectal pain and hemorrhoids that have worsened in the past 5 days. Last bowel movement was on 12/02/21. Did have a very small, hard pebble like stool this morning. Has lower abdominal pressure. This has been refractory to fiber. Has not tried any other medication. Saw two small black holes around his rectum that he is concerned about. Used procotofoam with minimal improvement and continues to use Sitz baths to aid in symptom relief.  ? ? ?No Known Allergies ? ? ? ?Outpatient Medications Prior to Visit  ?Medication Sig Dispense Refill  ? bictegravir-emtricitabine-tenofovir AF (BIKTARVY) 50-200-25 MG TABS tablet Take 1 tablet by mouth daily. 30 tablet 3  ? ondansetron (ZOFRAN) 4 MG tablet Take 1 tablet (4 mg total) by mouth every 8 (eight) hours as needed for nausea or vomiting. 20 tablet 0  ? hydrocortisone (PROCTOZONE-HC) 2.5 % rectal cream Place 1 application. rectally 2 (two) times daily. (Patient not taking: Reported on 12/07/2021) 30 g 0  ? ?No facility-administered medications prior to visit.  ? ? ? ?History reviewed. No pertinent past medical history. ? ? ?Past Surgical History:  ?Procedure Laterality Date  ? INNER EAR SURGERY    ? ? ? ? ?Review of Systems  ?Constitutional:  Negative for appetite change, chills, fatigue, fever and unexpected weight change.  ?Eyes:  Negative for visual disturbance.  ?Respiratory:  Negative for cough, chest tightness, shortness of breath and wheezing.   ?Cardiovascular:  Negative for chest pain and leg swelling.  ?Gastrointestinal:  Positive for abdominal distention, constipation, nausea and rectal pain.  Negative for abdominal pain, diarrhea and vomiting.  ?Genitourinary:  Negative for dysuria, flank pain, frequency, genital sores, hematuria and urgency.  ?Skin:  Negative for rash.  ?Allergic/Immunologic: Negative for immunocompromised state.  ?Neurological:  Negative for dizziness and headaches.  ?   ?Objective:  ?  ?BP 130/75   Pulse 90   Temp 98.5 ?F (36.9 ?C) (Oral)   Resp 16   Wt 169 lb (76.7 kg)   SpO2 98%   BMI 24.96 kg/m?  ?Nursing note and vital signs reviewed. ? ?Physical Exam ?Constitutional:   ?   General: He is not in acute distress. ?   Appearance: He is well-developed.  ?Cardiovascular:  ?   Rate and Rhythm: Normal rate and regular rhythm.  ?   Heart sounds: Normal heart sounds.  ?Pulmonary:  ?   Effort: Pulmonary effort is normal.  ?   Breath sounds: Normal breath sounds.  ?Genitourinary: ?   Comments: Two small ulcers around the 12 o'clock position; no external hemorrhoids; generalized rectal tenderness.  ?Skin: ?   General: Skin is warm and dry.  ?Neurological:  ?   Mental Status: He is alert and oriented to person, place, and time.  ?Psychiatric:     ?   Behavior: Behavior normal.     ?   Thought Content: Thought content normal.     ?   Judgment: Judgment normal.  ? ? ? ?   ? View : No data to display.  ?  ?  ?  ?  ?   ?  Assessment & Plan:  ? ? ?Patient Active Problem List  ? Diagnosis Date Noted  ? Constipation 12/07/2021  ? Hemorrhoids 11/17/2021  ? Healthcare maintenance 11/17/2021  ? HIV disease ([facility]) 10/21/2021  ? Encounter for screening for HIV   ? High risk homosexual behavior   ? Syncope 11-14-21  ? Diaphoresis 2021/11/14  ? FH: sudden cardiac death (SCD)   ? FH: premature coronary heart disease   ? ? ? ?Problem List Items Addressed This Visit   ? ?  ? Cardiovascular and Mediastinum  ? Hemorrhoids  ?  Chronic issue with hemorrhoids. No thrombosed hemorrhoid. Continue Preparation H suppositories and sitz baths as needed. Has appointment with GI in 3 weeks. ? ?  ?  ?  ? Other  ?  Constipation - Primary  ?  Mr. Beasley appears to have worsening constipation that is likely related to starting Zofran in the setting of previous constipation. Discussed limiting Zofran as able. Increase hydration and will start OTC regimen with Ducolox or Magnesium citrate. Consider additional laxatives if symptoms worsen or do not improve.  ? ?  ?  ? ? ? ?I am having Braxten Memmer. Gladman maintain his hydrocortisone, bictegravir-emtricitabine-tenofovir AF, and ondansetron. ? ? ?Follow-up: As scheduled.  ? ? ?Marcos Eke, MSN, FNP-C ?Nurse Practitioner ?[facility] ?[facility] Main number: [phone] ? ? ?

## 2021-12-07 NOTE — Patient Instructions (Signed)
Nice to see you. ? ?Recommend Magnesium Citrate or Ducolox products to help you with having a bowel movement; hold on fiber for the next few days.  ? ?Limit your Zofran as able.  ? ?Let us know if this does not work.  ? ?Continue to take your medications daily as prescribed.  ? ?Constipation, Adult ?Constipation is when a person has trouble pooping (having a bowel movement). When you have this condition, you may poop fewer than 3 times a week. Your poop (stool) may also be dry, hard, or bigger than normal. ?Follow these instructions at home: ?Eating and drinking ? ?Eat foods that have a lot of fiber, such as: ?Fresh fruits and vegetables. ?Whole grains. ?Beans. ?Eat less of foods that are low in fiber and high in fat and sugar, such as: ?[place] fries. ?Hamburgers. ?Cookies. ?Candy. ?Soda. ?Drink enough fluid to keep your pee (urine) pale yellow. ?General instructions ?Exercise regularly or as told by your doctor. Try to do 150 minutes of exercise each week. ?Go to the restroom when you feel like you need to poop. Do not hold it in. ?Take over-the-counter and prescription medicines only as told by your doctor. These include any fiber supplements. ?When you poop: ?Do deep breathing while relaxing your lower belly (abdomen). ?Relax your pelvic floor. The pelvic floor is a group of muscles that support the rectum, bladder, and intestines (as well as the uterus in women). ?Watch your condition for any changes. Tell your doctor if you notice any. ?Keep all follow-up visits as told by your doctor. This is important. ?Contact a doctor if: ?You have pain that gets worse. ?You have a fever. ?You have not pooped for 4 days. ?You vomit. ?You are not hungry. ?You lose weight. ?You are bleeding from the opening of the butt (anus). ?You have thin, pencil-like poop. ?Get help right away if: ?You have a fever, and your symptoms suddenly get worse. ?You leak poop or have blood in your poop. ?Your belly feels hard or bigger than  normal (bloated). ?You have very bad belly pain. ?You feel dizzy or you faint. ?Summary ?Constipation is when a person poops fewer than 3 times a week, has trouble pooping, or has poop that is dry, hard, or bigger than normal. ?Eat foods that have a lot of fiber. ?Drink enough fluid to keep your pee (urine) pale yellow. ?Take over-the-counter and prescription medicines only as told by your doctor. These include any fiber supplements. ?This information is not intended to replace advice given to you by your health care provider. Make sure you discuss any questions you have with your health care provider. ?Document Revised: 05/29/2019 Document Reviewed: 05/29/2019 ?Elsevier Patient Education ? [place]. ? ?

## 2021-12-07 NOTE — Telephone Encounter (Signed)
Patient called with concerns of constipation. States the last time he passed any stool was 5/12 and that it was a very minimal amount. He complains of rectal pain and swelling with possible hemorrhoids and is concerned that this is a side effect of his new medication.  ? ?He would like to talk with his provider as he is not sure what the best option is to move forward. He accepts appointment for this afternoon.  ? ?Sandie Ano, RN ? ?

## 2021-12-08 ENCOUNTER — Ambulatory Visit: Payer: Self-pay | Admitting: Neurology

## 2021-12-08 NOTE — Progress Notes (Deleted)
[facility]    Provider:  Dr Lucia Gaskins Requesting Provider: Gordy Councilman, MD Primary Care Provider:  Pcp, No  CC:  ***  HPI:  Gary Barrett is a 31 y.o. male here as requested by Gordy Councilman, MD for migraines . Also has neuropathy but very difficult to address these 2 completely different chief complaints in one visit.  He has a past medical history of syncope, diaphoresis, high risk sexual behavior, HIV disease, constipation.  I reviewed hospital course, patient was admitted at the end of March of this year after he presented to the emergency room complaining of loss of consciousness, he has had 2 episodes of loss of consciousness, on one of them he was waiting standing in line at the grocery store when he felt dizzy, left arm twitching and muscle of his neck and posterior head twitching and then he blacked out.  His mother and brother have a history of seizure.  Also his mother died about 2 months ago of coronary artery disease.  He also has a cousin who died suddenly when he was 84 years old.  He was admitted for further evaluation, syncope was thought to be vasovagal in nature, MRI was negative, EEG was negative, neurology evaluated patient, cardiology was also consulted and he was sent home with follow-up with cardiology.  Reviewed notes, labs and imaging from outside physicians, which showed ***  Review of Systems: Patient complains of symptoms per HPI as well as the following symptoms ***. Pertinent negatives and positives per HPI. All others negative.   Social History   Socioeconomic History   Marital status: Single    Spouse name: Not on file   Number of children: Not on file   Years of education: 16   Highest education level: Not on file  Occupational History   Not on file  Tobacco Use   Smoking status: Some Days    Packs/day: 0.10    Types: Cigarettes   Smokeless tobacco: Never  Vaping Use   Vaping Use: Never used  Substance and Sexual  Activity   Alcohol use: Yes    Alcohol/week: 1.0 standard drink    Types: 1 Glasses of wine per week    Comment: occ   Drug use: Yes    Types: Marijuana    Comment: occ   Sexual activity: Not on file    Comment: declined condoms  Other Topics Concern   Not on file  Social History Narrative   Not on file   Social Determinants of Health   Financial Resource Strain: Not on file  Food Insecurity: Not on file  Transportation Needs: Not on file  Physical Activity: Not on file  Stress: Not on file  Social Connections: Not on file  Intimate Partner Violence: Not on file    Family History  Problem Relation Age of Onset   CAD Mother    Colon cancer Maternal Grandmother    Sudden death Cousin     No past medical history on file.  Patient Active Problem List   Diagnosis Date Noted   Constipation 12/07/2021   Hemorrhoids 11/17/2021   Healthcare maintenance 11/17/2021   HIV disease (HCC) 10/21/2021   Encounter for screening for HIV    High risk homosexual behavior    Syncope November 11, 2021   Diaphoresis Nov 11, 2021   FH: sudden cardiac death (SCD)    FH: premature coronary heart disease     Past Surgical History:  Procedure Laterality Date   INNER EAR  SURGERY      Current Outpatient Medications  Medication Sig Dispense Refill   bictegravir-emtricitabine-tenofovir AF (BIKTARVY) 50-200-25 MG TABS tablet Take 1 tablet by mouth daily. 30 tablet 3   hydrocortisone (PROCTOZONE-HC) 2.5 % rectal cream Place 1 application. rectally 2 (two) times daily. (Patient not taking: Reported on 12/07/2021) 30 g 0   ondansetron (ZOFRAN) 4 MG tablet Take 1 tablet (4 mg total) by mouth every 8 (eight) hours as needed for nausea or vomiting. 20 tablet 0   No current facility-administered medications for this visit.    Allergies as of 12/08/2021   (No Known Allergies)    Vitals: There were no vitals taken for this visit. Last Weight:  Wt Readings from Last 1 Encounters:  12/07/21 169 lb  (76.7 kg)   Last Height:   Ht Readings from Last 1 Encounters:  10/18/21 5\' 9"  (1.753 m)     Physical exam: Exam: Gen: NAD, conversant, well nourised, obese, well groomed                     CV: RRR, no MRG. No Carotid Bruits. No peripheral edema, warm, nontender Eyes: Conjunctivae clear without exudates or hemorrhage  Neuro: Detailed Neurologic Exam  Speech:    Speech is normal; fluent and spontaneous with normal comprehension.  Cognition:    The patient is oriented to person, place, and time;     recent and remote memory intact;     language fluent;     normal attention, concentration,     fund of knowledge Cranial Nerves:    The pupils are equal, round, and reactive to light. The fundi are normal and spontaneous venous pulsations are present. Visual fields are full to finger confrontation. Extraocular movements are intact. Trigeminal sensation is intact and the muscles of mastication are normal. The face is symmetric. The palate elevates in the midline. Hearing intact. Voice is normal. Shoulder shrug is normal. The tongue has normal motion without fasciculations.   Coordination:    Normal finger to nose and heel to shin. Normal rapid alternating movements.   Gait:    Heel-toe and tandem gait are normal.   Motor Observation:    No asymmetry, no atrophy, and no involuntary movements noted. Tone:    Normal muscle tone.    Posture:    Posture is normal. normal erect    Strength:    Strength is V/V in the upper and lower limbs.      Sensation: intact to LT     Reflex Exam:  DTR's:    Deep tendon reflexes in the upper and lower extremities are normal bilaterally.   Toes:    The toes are downgoing bilaterally.   Clonus:    Clonus is absent.    Assessment/Plan:    No orders of the defined types were placed in this encounter.  No orders of the defined types were placed in this encounter.   Cc: Bhagat, , MD,  Pcp, No  Karmen Bongo, MD  [facility] [address] [place], [place]  Phone [phone] Fax [phone]

## 2021-12-14 ENCOUNTER — Encounter: Payer: Self-pay | Admitting: Family

## 2021-12-15 ENCOUNTER — Ambulatory Visit: Payer: Self-pay | Admitting: Student

## 2021-12-16 ENCOUNTER — Ambulatory Visit: Payer: Medicaid Other | Admitting: Family

## 2021-12-17 ENCOUNTER — Ambulatory Visit: Payer: Medicaid Other | Admitting: Family

## 2021-12-19 NOTE — Progress Notes (Unsigned)
Cardiology Office Note:    Date:  12/29/2021   ID:  Gary Barrett, DOB November 03, 1990, MRN [id number]  PCP:  Alvira Monday, FNP  Cardiologist:  Skeet Latch, MD  Electrophysiologist:  None   Referring MD: No ref. provider found   Chief Complaint: hospital follow-up of syncope  History of Present Illness:    Gary Barrett is a 31 y.o. male with a history of normal coronaries on coronary CTA in 09/2021, recurrent syncope with no significant arrhythmias noted on monitor in 10/2021 (likely vasovagal in nature), HIV, and a family history of sudden cardiac death who is followed by Dr. Oval Linsey and presents today for hospital follow-up of syncope.    Patient was first seen by Cardiology during recent hospitalization. He was admitted from 10/17/2021 to 10/19/2021 for further evaluation or recurrent syncope. Patient had 3 episodes of syncope over the prior month. Episodes sounded vasovagal in nature; however, he has a history of sudden cardiac death further cardiac work-up was obtained. Echo showed LVEF of 60-65% with normal wall motion, normal diastolic parameters, and no significant valvular diease. Coronary CTA showed a coronary calcium score of 0 with no evidence of CAD.  He was advised not to drive for 6 months given 2 of these episodes occurred while sitting. Outpatient monitor was ordered to rule out and showed no significant arrhyhmias. Of note, he was also diagnosed with HIV during this admission. He was seen by ID and was started on Biktarvy as an outpatient.  Patient presents today for follow-up. Here alone. Overall doing well. He has been having a lot of nausea with the Biktarvy. Zofran was helping this but then this caused significant constipation so he is now on Phenergan. He denies any recurrent syncope. He does report some intermittent chest tightness if he really exerts himself. No resting pain. No shortness of breath, orthopnea, PND, lower extremity edema. No palpitations or  significant lightheadedness/dizziness.   He was seen by his PCP earlier this week and BP was elevated at 138/90. His PCP initially started Amlodipine 5mg  daily but then advised him not to start this and monitor his BP for 1 month first. His BP is well controlled in office today at 118/64.   Past Medical History:  Diagnosis Date   HIV (human immunodeficiency virus infection) ([place])     Past Surgical History:  Procedure Laterality Date   INNER EAR SURGERY      Current Medications: Current Meds  Medication Sig   bictegravir-emtricitabine-tenofovir AF (BIKTARVY) 50-200-25 MG TABS tablet Take 1 tablet by mouth daily.   hydrocortisone (PROCTOZONE-HC) 2.5 % rectal cream Place 1 application. rectally 2 (two) times daily.   hydrocortisone 2.5 % cream Apply 1 application. topically 2 (two) times daily as needed.   promethazine (PHENERGAN) 12.5 MG tablet Take 1 tablet (12.5 mg total) by mouth every 6 (six) hours as needed for nausea or vomiting.     Allergies:   Patient has no known allergies.   Social History   Socioeconomic History   Marital status: Single    Spouse name: Not on file   Number of children: Not on file   Years of education: 16   Highest education level: Not on file  Occupational History   Not on file  Tobacco Use   Smoking status: Some Days    Packs/day: 0.10    Types: Cigarettes   Smokeless tobacco: Never  Vaping Use   Vaping Use: Never used  Substance and Sexual Activity   Alcohol use:  Yes    Alcohol/week: 1.0 standard drink    Types: 1 Glasses of wine per week    Comment: occ   Drug use: Yes    Types: Marijuana    Comment: occ   Sexual activity: Not on file    Comment: declined condoms  Other Topics Concern   Not on file  Social History Narrative   Not on file   Social Determinants of Health   Financial Resource Strain: Not on file  Food Insecurity: Not on file  Transportation Needs: Not on file  Physical Activity: Not on file  Stress: Not on  file  Social Connections: Not on file     Family History: The patient's family history includes CAD in his mother; Gary cancer in his maternal grandmother; Heart disease in his mother; Sudden death in his cousin.  ROS:   Please see the history of present illness.     EKGs/Labs/Other Studies Reviewed:    The following studies were reviewed today:  Echocardiogram 10/18/2021: Impressions: 1. Left ventricular ejection fraction, by estimation, is 60 to 65%. The  left ventricle has normal function. The left ventricle has no regional  wall motion abnormalities. Left ventricular diastolic parameters were  normal.   2. Right ventricular systolic function is normal. The right ventricular  size is normal.   3. The mitral valve is normal in structure. Trivial mitral valve  regurgitation. No evidence of mitral stenosis.   4. The aortic valve is tricuspid. Aortic valve regurgitation is not  visualized. No aortic stenosis is present.   5. The inferior vena cava is normal in size with greater than 50%  respiratory variability, suggesting right atrial pressure of 3 mmHg.   Comparison(s): No prior Echocardiogram. _______________  Coronary CTA 10/19/2021: Impressions: 1. Coronary calcium score of 0. This was 0 percentile for age-, sex, and race-matched controls. 2. Normal coronary origin with right dominance. 3. Normal coronary arteries.  CAD RADS 0. 4.  Consider non atherosclerotic causes of chest pain. _______________  Event Monitor 10/29/2021 to 11/27/2021: Quality: Fair.  Baseline artifact. Predominant rhythm: Sinus rhythm Average heart rate: 78 bpm Max heart rate: 173 bpm Min heart rate: 49 bpm Pauses >2.5 seconds: None   No significant arrhythmias.  Patient noted heart fluttering and chest pain at which time sinus rhythm and sinus tachycardia were noted.  EKG:  EKG not ordered today.   Recent Labs: 10/17/2021: B Natriuretic Peptide 13.8 10/18/2021: ALT 29; BUN 15; Creatinine, Ser  1.07; Potassium 4.0; Sodium 138; TSH 2.204 11/17/2021: Hemoglobin 14.4; Platelets 218  Recent Lipid Panel    Component Value Date/Time   CHOL 167 10/18/2021 1033   TRIG 127 10/18/2021 1033   HDL 21 (L) 10/18/2021 1033   CHOLHDL 8.0 10/18/2021 1033   VLDL 25 10/18/2021 1033   LDLCALC 121 (H) 10/18/2021 1033    Physical Exam:    Vital Signs: BP 118/64   Pulse 82   Ht 5\' 9"  (1.753 m)   Wt 169 lb 6.4 oz (76.8 kg)   SpO2 98%   BMI 25.02 kg/m     Wt Readings from Last 3 Encounters:  12/29/21 169 lb 6.4 oz (76.8 kg)  12/27/21 164 lb 12.8 oz (74.8 kg)  12/23/21 169 lb 6.4 oz (76.8 kg)     General: 31 y.o. Caucasian male in no acute distress. HEENT: Normocephalic and atraumatic. Sclera clear. EOMs intact. Neck: Supple. No carotid bruits. No JVD. Heart: RRR. Distinct S1 and S2. No murmurs, gallops, or rubs.  Lungs: No increased work of breathing. Clear to ausculation bilaterally. No wheezes, rhonchi, or rales.  Abdomen: Soft, non-distended, and non-tender to palpation. Extremities: No lower extremity edema.    Skin: Warm and dry. Neuro: Alert and oriented x3. No focal deficits. Psych: Normal affect. Responds appropriately.   Assessment:    1. Recurrent syncope   2. Chest pain of uncertain etiology   3. Elevated BP without diagnosis of hypertension     Plan:    Recurrent Syncope Patient was admitted in 09/2021 with recurrent syncope. Cardiac work-up unremarkable. Echo showed normal LV function with no significant valvular disease. Coronary CTA showed a coronary calcium score of 0 with no evidence of CAD. Event Monitor showed no significant arrhythmias. Likely vasovagal in nature. - No recurrent syncope.  - Discussed importance of staying well hydrated, increasing sodium in his diet, and considering compression stockings if he has worsening symptoms.  - Reviewed previous recommendation of not driving for 6 months given 2 episodes of syncope occurred when seated.  Chest  Pain Patient reports intermittent chest tightness if he really exerts himself.  - Very reassuring that recent coronary CTA in 09/2021 showed coronary calcium score of 0 with no evidence of CAD. No additional work-up necessary at this time. Advised patient to let us know if symptoms worsen.  Elevated BP without Diagnosis of Hypertension Patient's BP was elevated at 138/90 in PCP's office earlier but is well controlled at 118/64 today. PCP initially prescribed Amlodipine 5mg  daily but then advised patient not to start this and monitor BP for a month first. - Agree with holding off on starting Amlodipine for now. Asked patient to keep log of BP/HR for 2 weeks and then send [place] a MyChart message with these readings.   Disposition: Follow up in 1 year.   Medication Adjustments/Labs and Tests Ordered: Current medicines are reviewed at length with the patient today.  Concerns regarding medicines are outlined above.  No orders of the defined types were placed in this encounter.  No orders of the defined types were placed in this encounter.   Patient Instructions  Medication Instructions:  Your physician recommends that you continue on your current medications as directed. Please refer to the Current Medication list given to you today.  *If you need a refill on your cardiac medications before your next appointment, please call your pharmacy*   Lab Work: NONE ordered at this time of appointment   If you have labs (blood work) drawn today and your tests are completely normal, you will receive your results only by: [place] (if you have MyChart) OR A paper copy in the mail If you have any lab test that is abnormal or we need to change your treatment, we will call you to review the results.   Testing/Procedures: NONE ordered at this time of appointment     Follow-Up: At [facility], you and your health needs are our priority.  As part of our continuing mission to provide you with  exceptional heart care, we have created designated Provider Care Teams.  These Care Teams include your primary Cardiologist (physician) and Advanced Practice Providers (APPs -  Physician Assistants and Nurse Practitioners) who all work together to provide you with the care you need, when you need it.  We recommend signing up for the patient portal called "MyChart".  Sign up information is provided on this After Visit Summary.  MyChart is used to connect with patients for Virtual Visits (Telemedicine).  Patients are able to  view lab/test results, encounter notes, upcoming appointments, etc.  Non-urgent messages can be sent to your provider as well.   To learn more about what you can do with MyChart, go to [email or web address].    Your next appointment:   1 year   The format for your next appointment:   In Person  Provider:   Skeet Latch, MD     Other Instructions Blood Pressure log given. Please monitor BP and Heart rate for 2 weeks and mychart results back to our office.   Important Information About Sugar         Signed, Darreld Mclean, PA-C  12/29/2021 4:20 PM    [facility]

## 2021-12-22 ENCOUNTER — Ambulatory Visit: Payer: Self-pay | Admitting: Gastroenterology

## 2021-12-23 ENCOUNTER — Ambulatory Visit (INDEPENDENT_AMBULATORY_CARE_PROVIDER_SITE_OTHER): Payer: BC Managed Care – PPO | Admitting: Internal Medicine

## 2021-12-23 ENCOUNTER — Encounter: Payer: Self-pay | Admitting: Internal Medicine

## 2021-12-23 VITALS — BP 110/58 | HR 72 | Temp 97.5°F | Ht 69.0 in | Wt 169.4 lb

## 2021-12-23 DIAGNOSIS — R110 Nausea: Secondary | ICD-10-CM | POA: Diagnosis not present

## 2021-12-23 DIAGNOSIS — K6289 Other specified diseases of anus and rectum: Secondary | ICD-10-CM

## 2021-12-23 DIAGNOSIS — K5900 Constipation, unspecified: Secondary | ICD-10-CM

## 2021-12-23 DIAGNOSIS — K625 Hemorrhage of anus and rectum: Secondary | ICD-10-CM

## 2021-12-23 MED ORDER — PROMETHAZINE HCL 12.5 MG PO TABS: 12.5000 mg | ORAL_TABLET | Freq: Four times a day (QID) | ORAL | 1 refills | Status: DC | PRN

## 2021-12-23 NOTE — Patient Instructions (Signed)
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  I am going to send in a specialized cream to [facility] called nitroglycerin.  I want you to use this 3-4 times a day.  Carefully apply a small amount on the tip of your finger or a Q-tip and make sure that you place the medicine inside the anal sphincter itself.  I also want you to keep your stools soft.  Would recommend taking daily docusate sodium over-the-counter.  Follow-up in 6 to 8 weeks.  It was very nice meeting today.  Dr. Marletta Lor  At [facility] we value your feedback. You may receive a survey about your visit today. Please share your experience as we strive to create trusting relationships with our patients to provide genuine, compassionate, quality care.  We appreciate your understanding and patience as we review any laboratory studies, imaging, and other diagnostic tests that are ordered as we care for you. Our office policy is 5 business days for review of these results, and any emergent or urgent results are addressed in a timely manner for your best interest. If you do not hear from our office in 1 week, please contact us.   We also encourage the use of MyChart, which contains your medical information for your review as well. If you are not enrolled in this feature, an access code is on this after visit summary for your convenience. Thank you for allowing [place] to be involved in your care.  It was great to see you today!  I hope you have a great rest of your Spring!    Hennie Duos. Marletta Lor, D.O. Gastroenterology and Hepatology [facility]

## 2021-12-23 NOTE — Progress Notes (Signed)
Primary Care Physician:  Gilmore Laroche, FNP Primary Gastroenterologist:  Dr. Marletta Lor  Chief Complaint  Patient presents with   New Patient (Initial Visit)    Pt had vomiting this am. Pt states he has external hemorrhoids. Last BM yesterday (very painful).    HPI:   Gary Barrett is a 31 y.o. male who presents as a new patient for ER follow-up visit.  Recent diagnosis of HIV March 2023, follows with infectious disease.  States his HIV medications have caused severe nausea and occasional vomiting.  Has taken Zofran for this which helps his nausea but then makes him constipated.  States he has had rectal pain since October 2022.  Primarily with bowel movements, sometimes very severe.  Has been trialed on Proctofoam and hydrocortisone cream without relief.  Also with occasional rectal bleeding as well as mucus.  No diarrhea.  No abdominal pain.  He is frustrated as his HIV meds cause nausea which is then relieved with Zofran but then leads to constipation/worsening rectal pain.  He feels as though he has to choose between worsening rectal pain/constipation or nausea on a daily basis.  Past Medical History:  Diagnosis Date   HIV (human immunodeficiency virus infection) (HCC)     Past Surgical History:  Procedure Laterality Date   INNER EAR SURGERY      Current Outpatient Medications  Medication Sig Dispense Refill   bictegravir-emtricitabine-tenofovir AF (BIKTARVY) 50-200-25 MG TABS tablet Take 1 tablet by mouth daily. 30 tablet 3   hydrocortisone (PROCTOZONE-HC) 2.5 % rectal cream Place 1 application. rectally 2 (two) times daily. 30 g 0   ondansetron (ZOFRAN) 4 MG tablet Take 1 tablet (4 mg total) by mouth every 8 (eight) hours as needed for nausea or vomiting. (Patient not taking: Reported on 12/23/2021) 20 tablet 0   No current facility-administered medications for this visit.    Allergies as of 12/23/2021   (No Known Allergies)    Family History  Problem Relation  Age of Onset   Heart disease Mother    CAD Mother    Colon cancer Maternal Grandmother    Sudden death Cousin     Social History   Socioeconomic History   Marital status: Single    Spouse name: Not on file   Number of children: Not on file   Years of education: 16   Highest education level: Not on file  Occupational History   Not on file  Tobacco Use   Smoking status: Some Days    Packs/day: 0.10    Types: Cigarettes   Smokeless tobacco: Never  Vaping Use   Vaping Use: Never used  Substance and Sexual Activity   Alcohol use: Yes    Alcohol/week: 1.0 standard drink    Types: 1 Glasses of wine per week    Comment: occ   Drug use: Yes    Types: Marijuana    Comment: occ   Sexual activity: Not on file    Comment: declined condoms  Other Topics Concern   Not on file  Social History Narrative   Not on file   Social Determinants of Health   Financial Resource Strain: Not on file  Food Insecurity: Not on file  Transportation Needs: Not on file  Physical Activity: Not on file  Stress: Not on file  Social Connections: Not on file  Intimate Partner Violence: Not on file    Subjective: Review of Systems  Constitutional:  Negative for chills and fever.  HENT:  Negative  for congestion and hearing loss.   Eyes:  Negative for blurred vision and double vision.  Respiratory:  Negative for cough and shortness of breath.   Cardiovascular:  Negative for chest pain and palpitations.  Gastrointestinal:  Positive for blood in stool, constipation, nausea and vomiting. Negative for abdominal pain, diarrhea, heartburn and melena.       Rectal pain  Genitourinary:  Negative for dysuria and urgency.  Musculoskeletal:  Negative for joint pain and myalgias.  Skin:  Negative for itching and rash.  Neurological:  Negative for dizziness and headaches.  Psychiatric/Behavioral:  Negative for depression. The patient is not nervous/anxious.       Objective: BP (!) 110/58   Pulse 72    Temp (!) 97.5 F (36.4 C)   Ht 5\' 9"  (1.753 m)   Wt 169 lb 6.4 oz (76.8 kg)   BMI 25.02 kg/m  Physical Exam Constitutional:      Appearance: Normal appearance.  HENT:     Head: Normocephalic and atraumatic.  Eyes:     Extraocular Movements: Extraocular movements intact.     Conjunctiva/sclera: Conjunctivae normal.  Cardiovascular:     Rate and Rhythm: Normal rate and regular rhythm.  Pulmonary:     Effort: Pulmonary effort is normal.     Breath sounds: Normal breath sounds.  Abdominal:     General: Bowel sounds are normal.     Palpations: Abdomen is soft.  Musculoskeletal:        General: Normal range of motion.     Cervical back: Normal range of motion and neck supple.  Skin:    General: Skin is warm.  Neurological:     General: No focal deficit present.     Mental Status: He is alert and oriented to person, place, and time.  Psychiatric:        Mood and Affect: Mood normal.        Behavior: Behavior normal.  Rectal exam: Multiple anal fissures present.  Very tender to touch. DRE not performed.   Assessment: *Multiple anal fissures *Rectal pain due to above *Nausea and vomiting *Constipation  Plan: Discussed anal fissures in depth with patient today.  We will send in prescription for topical nitroglycerin/lidocaine to use 3-4 times a day.  Recommend he keep his bowels soft.  States he cannot tolerate powders.  Would recommend he take docusate sodium every day.  Can consider colonoscopy in the future pending clinical course.  Nausea and vomiting likely consequence of his HIV medications.  States Zofran makes him constipated.  We will send in Phenergan to see if this helps instead.  Follow-up in 6 to 8 weeks.  12/23/2021 1:02 PM   Disclaimer: This note was dictated with voice recognition software. Similar sounding words can inadvertently be transcribed and may not be corrected upon review.

## 2021-12-27 ENCOUNTER — Encounter: Payer: Self-pay | Admitting: Family Medicine

## 2021-12-27 ENCOUNTER — Ambulatory Visit (INDEPENDENT_AMBULATORY_CARE_PROVIDER_SITE_OTHER): Payer: BC Managed Care – PPO | Admitting: Family Medicine

## 2021-12-27 VITALS — BP 138/90 | HR 77 | Ht 69.0 in | Wt 164.8 lb

## 2021-12-27 DIAGNOSIS — B20 Human immunodeficiency virus [HIV] disease: Secondary | ICD-10-CM | POA: Diagnosis not present

## 2021-12-27 DIAGNOSIS — R030 Elevated blood-pressure reading, without diagnosis of hypertension: Secondary | ICD-10-CM | POA: Diagnosis not present

## 2021-12-27 DIAGNOSIS — Z23 Encounter for immunization: Secondary | ICD-10-CM | POA: Diagnosis not present

## 2021-12-27 DIAGNOSIS — I10 Essential (primary) hypertension: Secondary | ICD-10-CM

## 2021-12-27 MED ORDER — AMLODIPINE BESYLATE 5 MG PO TABS: 5.0000 mg | ORAL_TABLET | Freq: Every day | ORAL | 1 refills | Status: DC

## 2021-12-27 NOTE — Patient Instructions (Addendum)
I appreciate the opportunity to provide care to you today!    Follow up:  1 months  Labs: labs at your next visit  Please pick up your BP medication  Norvasc Take at the same time each day, with or without food -Patients should be cautious in the first few days of therapy as they may experience dizziness/light-headedness from their body -adjusting to their blood pressure lowering. -Instruct patients to watch for swelling of legs, ankles, or feet, and to report these symptoms to their provider.  Thanks for getting your  Tdap vaccine    Please continue to a heart-healthy diet and increase your physical activities. Try to exercise for at least three times a week.      It was a pleasure to see you and I look forward to continuing to work together on your health and well-being. Please do not hesitate to call the office if you need care or have questions about your care.   Have a wonderful day and week. With Gratitude, Gilmore Laroche MSN, FNP-BC

## 2021-12-27 NOTE — Progress Notes (Unsigned)
New Patient Office Visit  Subjective:  Patient ID: Gary Barrett, male    DOB: February 16, 1991  Age: 31 y.o. MRN: [id number]  CC:  Chief Complaint  Patient presents with   New Patient (Initial Visit)    Pt is establishing care, complains of nausea and hemorrhoids.     HPI Gary Barrett is a 31 y.o. male with past medical history of HIV disease, constipation, and nause presents for establishing care.  Nausea: onset of symptoms is a month and a half. He was recently diagnosed with HIV in March 2023, followed by infectious disease. He his taking Biktarvy and reports that the medication is causing him severe nausea, which is then relieved with Zofran but then leads to constipation/worsening rectal pain. His rectal pain is due to findings of multiple anal fissures and internal and external hemorrhoids and he is following up with GI. Last GI visit was on 12/23/21. He has occasional vomiting and reports vomiting bitter white foam every morning. He reports taking Zofran, which helps his nausea but causes constipation. He reported following up with his cardiology on 11/29/21 and infectious disease and neurology on 11/30/21.  HTN: elevated diastolic BP today. The patient is asymptomatic. He reports elevated BP previously.   He received his Tdap vaccine today.   Past Medical History:  Diagnosis Date   HIV (human immunodeficiency virus infection) (HCC)     Past Surgical History:  Procedure Laterality Date   INNER EAR SURGERY      Family History  Problem Relation Age of Onset   Heart disease Mother    CAD Mother    Colon cancer Maternal Grandmother    Sudden death Cousin     Social History   Socioeconomic History   Marital status: Single    Spouse name: Not on file   Number of children: Not on file   Years of education: 16   Highest education level: Not on file  Occupational History   Not on file  Tobacco Use   Smoking status: Some Days    Packs/day: 0.10    Types:  Cigarettes   Smokeless tobacco: Never  Vaping Use   Vaping Use: Never used  Substance and Sexual Activity   Alcohol use: Yes    Alcohol/week: 1.0 standard drink    Types: 1 Glasses of wine per week    Comment: occ   Drug use: Yes    Types: Marijuana    Comment: occ   Sexual activity: Not on file    Comment: declined condoms  Other Topics Concern   Not on file  Social History Narrative   Not on file   Social Determinants of Health   Financial Resource Strain: Not on file  Food Insecurity: Not on file  Transportation Needs: Not on file  Physical Activity: Not on file  Stress: Not on file  Social Connections: Not on file  Intimate Partner Violence: Not on file    ROS Review of Systems  Constitutional:  Negative for diaphoresis and fever.  HENT:  Negative for sinus pressure, sinus pain, sneezing and sore throat.   Eyes:  Negative for pain and redness.  Respiratory:  Negative for cough, chest tightness, shortness of breath and wheezing.   Cardiovascular:  Negative for chest pain and palpitations.  Gastrointestinal:  Positive for blood in stool (hemorroids), constipation, nausea and rectal pain (hemorroids). Negative for abdominal pain and vomiting.  Endocrine: Negative for polydipsia, polyphagia and polyuria.  Genitourinary:  Negative for hematuria and  urgency.  Musculoskeletal:  Negative for gait problem and neck stiffness.  Skin:  Negative for rash and wound.  Neurological:  Negative for dizziness, speech difficulty and numbness.  Psychiatric/Behavioral:  Negative for confusion, self-injury, sleep disturbance and suicidal ideas.    Objective:   Today's Vitals: BP 138/90   Pulse 77   Ht 5\' 9"  (1.753 m)   Wt 164 lb 12.8 oz (74.8 kg)   SpO2 97%   BMI 24.34 kg/m   Physical Exam HENT:     Head: Normocephalic.     Right Ear: External ear normal.     Left Ear: External ear normal.     Mouth/Throat:     Mouth: Mucous membranes are moist.  Eyes:     Extraocular  Movements: Extraocular movements intact.     Pupils: Pupils are equal, round, and reactive to light.  Cardiovascular:     Rate and Rhythm: Normal rate and regular rhythm.     Pulses: Normal pulses.     Heart sounds: Normal heart sounds.  Pulmonary:     Effort: Pulmonary effort is normal.     Breath sounds: Normal breath sounds.  Abdominal:     Palpations: Abdomen is soft.  Musculoskeletal:     Cervical back: No rigidity.     Right lower leg: No edema.     Left lower leg: No edema.  Skin:    General: Skin is warm.  Neurological:     Mental Status: He is alert and oriented to person, place, and time.  Psychiatric:     Comments: Normal affect    Assessment & Plan:   Problem List Items Addressed This Visit       Other   HIV disease (HCC)    -recently diagnosed with HIV in March 2023, followed by infectious disease.  -he his taking Biktarvy and reports that the medication is causing him severe nausea, which is then relieved with Zofran but then leads to constipation/worsening rectal pain. -following up with infectious disease on 11/30/21      Elevated BP without diagnosis of hypertension    -elevated diastolic BP today -previous office visit BP has been within normal limits -will have the patient monitor his BP at home and reassess in 1 month        Other Visit Diagnoses     Need for tetanus, diphtheria, and acellular pertussis (Tdap) vaccine    -  Primary   Relevant Orders   Tdap vaccine greater than or equal to 7yo IM (Completed)       Outpatient Encounter Medications as of 12/27/2021  Medication Sig   bictegravir-emtricitabine-tenofovir AF (BIKTARVY) 50-200-25 MG TABS tablet Take 1 tablet by mouth daily.   promethazine (PHENERGAN) 12.5 MG tablet Take 1 tablet (12.5 mg total) by mouth every 6 (six) hours as needed for nausea or vomiting.   [DISCONTINUED] amLODipine (NORVASC) 5 MG tablet Take 1 tablet (5 mg total) by mouth daily.   hydrocortisone (PROCTOZONE-HC) 2.5  % rectal cream Place 1 application. rectally 2 (two) times daily. (Patient not taking: Reported on 12/27/2021)   ondansetron (ZOFRAN) 4 MG tablet Take 1 tablet (4 mg total) by mouth every 8 (eight) hours as needed for nausea or vomiting. (Patient not taking: Reported on 12/23/2021)   No facility-administered encounter medications on file as of 12/27/2021.    Follow-up: Return in about 1 month (around 01/26/2022), or BP.   03/29/2022, FNP

## 2021-12-28 ENCOUNTER — Telehealth: Payer: Self-pay | Admitting: Family Medicine

## 2021-12-28 DIAGNOSIS — R030 Elevated blood-pressure reading, without diagnosis of hypertension: Secondary | ICD-10-CM | POA: Insufficient documentation

## 2021-12-28 NOTE — Telephone Encounter (Signed)
Pt has been informed.

## 2021-12-28 NOTE — Assessment & Plan Note (Signed)
-  recently diagnosed with HIV in March 2023, followed by infectious disease.  -he his taking Biktarvy and reports that the medication is causing him severe nausea, which is then relieved with Zofran but then leads to constipation/worsening rectal pain. -following up with infectious disease on 11/30/21

## 2021-12-28 NOTE — Assessment & Plan Note (Signed)
-  elevated diastolic BP today -previous office visit BP has been within normal limits -will have the patient monitor his BP at home and reassess in 1 month

## 2021-12-29 ENCOUNTER — Encounter: Payer: Self-pay | Admitting: Student

## 2021-12-29 ENCOUNTER — Ambulatory Visit (INDEPENDENT_AMBULATORY_CARE_PROVIDER_SITE_OTHER): Payer: BC Managed Care – PPO | Admitting: Student

## 2021-12-29 VITALS — BP 118/64 | HR 82 | Ht 69.0 in | Wt 169.4 lb

## 2021-12-29 DIAGNOSIS — R55 Syncope and collapse: Secondary | ICD-10-CM | POA: Diagnosis not present

## 2021-12-29 DIAGNOSIS — R079 Chest pain, unspecified: Secondary | ICD-10-CM

## 2021-12-29 DIAGNOSIS — R030 Elevated blood-pressure reading, without diagnosis of hypertension: Secondary | ICD-10-CM | POA: Diagnosis not present

## 2021-12-29 NOTE — Patient Instructions (Addendum)
Medication Instructions:  Your physician recommends that you continue on your current medications as directed. Please refer to the Current Medication list given to you today.  *If you need a refill on your cardiac medications before your next appointment, please call your pharmacy*   Lab Work: NONE ordered at this time of appointment   If you have labs (blood work) drawn today and your tests are completely normal, you will receive your results only by: MyChart Message (if you have MyChart) OR A paper copy in the mail If you have any lab test that is abnormal or we need to change your treatment, we will call you to review the results.   Testing/Procedures: NONE ordered at this time of appointment     Follow-Up: At [facility], you and your health needs are our priority.  As part of our continuing mission to provide you with exceptional heart care, we have created designated Provider Care Teams.  These Care Teams include your primary Cardiologist (physician) and Advanced Practice Providers (APPs -  Physician Assistants and Nurse Practitioners) who all work together to provide you with the care you need, when you need it.  We recommend signing up for the patient portal called "MyChart".  Sign up information is provided on this After Visit Summary.  MyChart is used to connect with patients for Virtual Visits (Telemedicine).  Patients are able to view lab/test results, encounter notes, upcoming appointments, etc.  Non-urgent messages can be sent to your provider as well.   To learn more about what you can do with MyChart, go to [email or web address].    Your next appointment:   1 year   The format for your next appointment:   In Person  Provider:   Chilton Si, MD     Other Instructions Blood Pressure log given. Please monitor BP and Heart rate for 2 weeks and mychart results back to our office.   Important Information About Sugar

## 2021-12-31 ENCOUNTER — Other Ambulatory Visit: Payer: Self-pay

## 2021-12-31 ENCOUNTER — Encounter: Payer: Self-pay | Admitting: Family

## 2021-12-31 ENCOUNTER — Ambulatory Visit (INDEPENDENT_AMBULATORY_CARE_PROVIDER_SITE_OTHER): Payer: BC Managed Care – PPO | Admitting: Family

## 2021-12-31 ENCOUNTER — Ambulatory Visit (INDEPENDENT_AMBULATORY_CARE_PROVIDER_SITE_OTHER): Payer: BC Managed Care – PPO | Admitting: Neurology

## 2021-12-31 ENCOUNTER — Encounter: Payer: Self-pay | Admitting: Neurology

## 2021-12-31 VITALS — BP 155/83 | HR 78 | Resp 16 | Ht 69.0 in | Wt 167.0 lb

## 2021-12-31 VITALS — BP 139/87 | HR 63 | Ht 69.0 in | Wt 166.0 lb

## 2021-12-31 DIAGNOSIS — Z0000 Encounter for general adult medical examination without abnormal findings: Secondary | ICD-10-CM

## 2021-12-31 DIAGNOSIS — K5903 Drug induced constipation: Secondary | ICD-10-CM | POA: Diagnosis not present

## 2021-12-31 DIAGNOSIS — B20 Human immunodeficiency virus [HIV] disease: Secondary | ICD-10-CM | POA: Diagnosis not present

## 2021-12-31 DIAGNOSIS — K649 Unspecified hemorrhoids: Secondary | ICD-10-CM

## 2021-12-31 DIAGNOSIS — R55 Syncope and collapse: Secondary | ICD-10-CM | POA: Diagnosis not present

## 2021-12-31 MED ORDER — DELSTRIGO 100-300-300 MG PO TABS: 1.0000 | ORAL_TABLET | Freq: Every day | ORAL | 1 refills | Status: DC

## 2021-12-31 NOTE — Progress Notes (Signed)
[facility]  PATIENT: Gary Barrett DOB: July 31, 1990  REQUESTING CLINICIAN: Lorenza Chick, MD HISTORY FROM: Patient  REASON FOR VISIT: Syncope    HISTORICAL  CHIEF COMPLAINT:  Chief Complaint  Patient presents with   New Patient (Initial Visit)    Rm 13. Alone. NP internal referral for seizures and syncope.    HISTORY OF PRESENT ILLNESS:  This is a 31 year old gentleman with recently diagnosed HIV who is presenting with multiple syncope.  Patient reports a week of March 26 he had 3 syncopal episodes.  For each of the syncopal episode he will feel hot, his vision will go black then he would pass out.  He reported last episode was on March 26 when he was in line at [employer], he felt hot, his vision went black and the next thing that he remembers is waking up in the hospital.  He reported the episode lasted less than a minute but he does not remember what happened after until being in the hospital.  In the hospital he was admitted, had MRI brain that was negative and EEG which was negative for any epileptiform discharge.  During the work-up he was also found to be HIV positive.  Patient reports during that time he was under a lot of stress, his mother recently passed away and he ended a 13 years relationship and was in the point of moving out.  Since leaving the hospital he has not had any syncopal episode but last night he had a presyncopal episode meaning that he felt hot and lay down and the symptoms subsided.  He also reports since being diagnosed with HIV he has been having bad reaction to the medications mainly nausea, and lately he has also been complaining of tremors and dropping things.     Handedness: Right handed   Onset: March 26  Seizure Type: Likely syncope   Current frequency: Only 3 episodes   Any injuries from seizures: None   Seizure risk factors: Family history of seizures   Previous ASMs: None   Currenty ASMs: None    ASMs side  effects: N/A   Brain Images: Normal   Previous EEGs: Normal    OTHER MEDICAL CONDITIONS: HIV  REVIEW OF SYSTEMS: Full 14 system review of systems performed and negative with exception of: as noted in the HPI   ALLERGIES: No Known Allergies  HOME MEDICATIONS: Outpatient Medications Prior to Visit  Medication Sig Dispense Refill   bictegravir-emtricitabine-tenofovir AF (BIKTARVY) 50-200-25 MG TABS tablet Take 1 tablet by mouth daily. 30 tablet 3   hydrocortisone (PROCTOZONE-HC) 2.5 % rectal cream Place 1 application. rectally 2 (two) times daily. 30 g 0   hydrocortisone 2.5 % cream Apply 1 application. topically 2 (two) times daily as needed.     promethazine (PHENERGAN) 12.5 MG tablet Take 1 tablet (12.5 mg total) by mouth every 6 (six) hours as needed for nausea or vomiting. 30 tablet 1   No facility-administered medications prior to visit.    PAST MEDICAL HISTORY: Past Medical History:  Diagnosis Date   HIV (human immunodeficiency virus infection) ([place])     PAST SURGICAL HISTORY: Past Surgical History:  Procedure Laterality Date   INNER EAR SURGERY      FAMILY HISTORY: Family History  Problem Relation Age of Onset   Heart disease Mother    CAD Mother    Colon cancer Maternal Grandmother    Sudden death Cousin     SOCIAL HISTORY: Social History  Socioeconomic History   Marital status: Single    Spouse name: Not on file   Number of children: Not on file   Years of education: 16   Highest education level: Not on file  Occupational History   Not on file  Tobacco Use   Smoking status: Some Days    Packs/day: 0.10    Types: Cigarettes   Smokeless tobacco: Never  Vaping Use   Vaping Use: Never used  Substance and Sexual Activity   Alcohol use: Yes    Alcohol/week: 1.0 standard drink of alcohol    Types: 1 Glasses of wine per week    Comment: occ   Drug use: Yes    Types: Marijuana    Comment: occ   Sexual activity: Not on file    Comment: declined  condoms  Other Topics Concern   Not on file  Social History Narrative   Not on file   Social Determinants of Health   Financial Resource Strain: Not on file  Food Insecurity: Not on file  Transportation Needs: Not on file  Physical Activity: Not on file  Stress: Not on file  Social Connections: Not on file  Intimate Partner Violence: Not on file    PHYSICAL EXAM  GENERAL EXAM/CONSTITUTIONAL: Vitals:  Vitals:   12/31/21 0837  BP: 139/87  Pulse: 63  Weight: 166 lb (75.3 kg)  Height: _0  (1.753 m)   Body mass index is 24.51 kg/m. Wt Readings from Last 3 Encounters:  12/31/21 166 lb (75.3 kg)  12/29/21 169 lb 6.4 oz (76.8 kg)  12/27/21 164 lb 12.8 oz (74.8 kg)   Patient is in no distress; well developed, nourished and groomed; neck is supple  EYES: Pupils round and reactive to light, Visual fields full to confrontation, Extraocular movements intacts,  No results found.  MUSCULOSKELETAL: Gait, strength, tone, movements noted in Neurologic exam below  NEUROLOGIC: MENTAL STATUS:      No data to display         awake, alert, oriented to person, place and time recent and remote memory intact normal attention and concentration language fluent, comprehension intact, naming intact fund of knowledge appropriate  CRANIAL NERVE:  2nd, 3rd, 4th, 6th - pupils equal and reactive to light, visual fields full to confrontation, extraocular muscles intact, no nystagmus 5th - facial sensation symmetric 7th - facial strength symmetric 8th - hearing intact 9th - palate elevates symmetrically, uvula midline 11th - shoulder shrug symmetric 12th - tongue protrusion midline  MOTOR:  normal bulk and tone, full strength in the BUE, BLE  SENSORY:  normal and symmetric to light touch, pinprick, temperature, vibration  COORDINATION:  finger-nose-finger, fine finger movements normal  REFLEXES:  deep tendon reflexes present and symmetric  GAIT/STATION:   normal   DIAGNOSTIC DATA (LABS, IMAGING, TESTING) - I reviewed patient records, labs, notes, testing and imaging myself where available.  Lab Results  Component Value Date   WBC 6.5 11/17/2021   HGB 14.4 11/17/2021   HCT 43.5 11/17/2021   MCV 91 11/17/2021   PLT 218 11/17/2021      Component Value Date/Time   NA 138 10/18/2021 0341   K 4.0 10/18/2021 0341   CL 106 10/18/2021 0341   CO2 23 10/18/2021 0341   GLUCOSE 89 10/18/2021 0341   BUN 15 10/18/2021 0341   CREATININE 1.07 10/18/2021 0341   CALCIUM 9.1 10/18/2021 0341   PROT 8.0 10/18/2021 0341   ALBUMIN 3.8 10/18/2021 0341   AST 23 10/18/2021  0341   ALT 29 10/18/2021 0341   ALKPHOS 69 10/18/2021 0341   BILITOT 0.7 10/18/2021 0341   GFRNONAA >60 10/18/2021 0341   GFRAA  07/23/2010 0230    >60        The eGFR has been calculated using the MDRD equation. This calculation has not been validated in all clinical situations. eGFR's persistently <60 mL/min signify possible Chronic Kidney Disease.   Lab Results  Component Value Date   CHOL 167 10/18/2021   HDL 21 (L) 10/18/2021   LDLCALC 121 (H) 10/18/2021   TRIG 127 10/18/2021   Lab Results  Component Value Date   HGBA1C 5.4 10/18/2021   No results found for: "VITAMINB12" Lab Results  Component Value Date   TSH 2.204 10/18/2021    MRI Brain 10/18/21 No acute abnormalities  No explanation for seizure. 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   EEG 10/18/21 This study is within normal limits. No seizures or epileptiform discharges were seen throughout the recording.  I personally reviewed brain Images and previous EEG reports.   ASSESSMENT AND PLAN  31 y.o. year old male  with recent diagnosis of HIV who is presenting after 3 syncopal episodes, the last one being on March 26.  The syncope were preceded by prodrome of vision going dark and feeling hot, denies any injury from these episodes.  He reports also at that time he was under a lot of  stress, recently lost his mother and also ended a 13-year relationship.  These episodes were at least as likely as not stress induced. Since leaving the hospital he has been doing well even though yesterday he had 1 presyncopal episode.  Advised patient that the EEG and MRI were negative for any seizures or seizure etiology.  At this time I will advise him to continue with following with his primary care doctor, infectious disease, to remain well-hydrated and return as needed.  He voices understanding.  If he continues to experience additional syncopal episode, will consider long-term EEG monitoring.   1. Syncope, unspecified syncope type     Patient Instructions  Continue current medications Continue to follow with primary care doctor and ID Remain well-hydrated Return as needed or for any other concern.   Per [facility] statutes, patients with seizures are not allowed to drive until they have been seizure-free for six months.  Other recommendations include using caution when using heavy equipment or power tools. Avoid working on ladders or at heights. Take showers instead of baths.  Do not swim alone.  Ensure the water temperature is not too high on the home water heater. Do not go swimming alone. Do not lock yourself in a room alone (i.e. bathroom). When caring for infants or small children, sit down when holding, feeding, or changing them to minimize risk of injury to the child in the event you have a seizure. Maintain good sleep hygiene. Avoid alcohol.  Also recommend adequate sleep, hydration, good diet and minimize stress.   During the Seizure  - First, ensure adequate ventilation and place patients on the floor on their left side  Loosen clothing around the neck and ensure the airway is patent. If the patient is clenching the teeth, do not force the mouth open with any object as this can cause severe damage - Remove all items from the surrounding that can be hazardous. The patient  may be oblivious to what's happening and may not even know what he or she is doing. If the  patient is confused and wandering, either gently guide him/her away and block access to outside areas - Reassure the individual and be comforting - Call 911. In most cases, the seizure ends before EMS arrives. However, there are cases when seizures may last over 3 to 5 minutes. Or the individual may have developed breathing difficulties or severe injuries. If a pregnant patient or a person with diabetes develops a seizure, it is prudent to call an ambulance. - Finally, if the patient does not regain full consciousness, then call EMS. Most patients will remain confused for about 45 to 90 minutes after a seizure, so you must use judgment in calling for help. - Avoid restraints but make sure the patient is in a bed with padded side rails - Place the individual in a lateral position with the neck slightly flexed; this will help the saliva drain from the mouth and prevent the tongue from falling backward - Remove all nearby furniture and other hazards from the area - Provide verbal assurance as the individual is regaining consciousness - Provide the patient with privacy if possible - Call for help and start treatment as ordered by the caregiver   After the Seizure (Postictal Stage)  After a seizure, most patients experience confusion, fatigue, muscle pain and/or a headache. Thus, one should permit the individual to sleep. For the next few days, reassurance is essential. Being calm and helping reorient the person is also of importance.  Most seizures are painless and end spontaneously. Seizures are not harmful to others but can lead to complications such as stress on the lungs, brain and the heart. Individuals with prior lung problems may develop labored breathing and respiratory distress.     No orders of the defined types were placed in this encounter.   No orders of the defined types were placed in this  encounter.   Return if symptoms worsen or fail to improve.  I have spent a total of 50 minutes dedicated to this patient today, preparing to see patient, performing a medically appropriate examination and evaluation, ordering tests and/or medications and procedures, and counseling and educating the patient/family/caregiver; independently interpreting result and communicating results to the family/patient/caregiver; and documenting clinical information in the electronic medical record.   Alric Ran, MD 12/31/2021, 9:40 AM  [facility] [address], [place], [place] [address] [phone]

## 2021-12-31 NOTE — Patient Instructions (Signed)
Continue current medications Continue to follow with primary care doctor and ID Remain well-hydrated Return as needed or for any other concern.

## 2021-12-31 NOTE — Assessment & Plan Note (Signed)
   Discussed importance of safe sexual practice and condom use. Condoms offered.   Encouraged routine dental care and can refer to [facility] clinic if needed.  

## 2021-12-31 NOTE — Assessment & Plan Note (Signed)
Gary Barrett has well-controlled virus with good adherence and poor tolerance to [facility] with nausea and vomiting that is resolved when medication is stopped. Reviewed previous lab work and discussed medication options. Not currently interested in [place]. Discussed medication options and will try Delstrigo to see if that resolves his nausea. Check lab work today. Plan for follow up in 1 month or sooner if needed.

## 2021-12-31 NOTE — Progress Notes (Signed)
Brief Narrative   Patient ID: Gary Barrett, male    DOB: [birth year], 32 y.o.   MRN: [id number]  Gary Barrett is a 31 y/o male with HIV-1 disease diagnosed on 10/17/21 with risk factor of MSM. Initial viral load was 40,500 with CD4 count of 199. Genotype with no significant medication resistant mutations. No history of opportunistic infection. [id number] negative. Entered care at [facility] Stage 3. Changed from [place] (N/V/D) to [place].   Subjective:    Chief Complaint  Patient presents with   Follow-up    Nausea is causing  him to be miserable.Wants to stop Biktarvy    HPI:  Gary Barrett is a 31 y.o. male with HIV disease last seen on 11/17/2021 after first month of Biktarvy after being switched from [place] secondary to nausea/vomiting.  Viral load was undetectable with CD4 count of 509.  In the interim has been seen for constipation.  Here today for routine follow-up.  Gary Barrett has been taking his Biktarvy and has continued to have nausea and/or vomiting with a severity that is making him miserable.  Stopping medication resolves his nausea symptoms.  Not a fan of injections.  Feeling well today with resolution of his constipation using MiraLAX. Denies fevers, chills, night sweats, headaches, changes in vision, neck pain/stiffness, nausea, diarrhea, vomiting, lesions or rashes.  Gary Barrett has no problems obtaining medication from the pharmacy remains covered by [facility].  Denies feelings of being down, depressed, or hopeless recently.  Drinks alcohol on occasion with occasional marijuana use and some day tobacco use.  Condoms offered.   No Known Allergies    Outpatient Medications Prior to Visit  Medication Sig Dispense Refill   hydrocortisone (PROCTOZONE-HC) 2.5 % rectal cream Place 1 application. rectally 2 (two) times daily. 30 g 0   hydrocortisone 2.5 % cream Apply 1 application. topically 2 (two) times daily as needed.     promethazine (PHENERGAN)  12.5 MG tablet Take 1 tablet (12.5 mg total) by mouth every 6 (six) hours as needed for nausea or vomiting. 30 tablet 1   bictegravir-emtricitabine-tenofovir AF (BIKTARVY) 50-200-25 MG TABS tablet Take 1 tablet by mouth daily. 30 tablet 3   No facility-administered medications prior to visit.     Past Medical History:  Diagnosis Date   HIV (human immunodeficiency virus infection) ([place])      Past Surgical History:  Procedure Laterality Date   INNER EAR SURGERY        Review of Systems  Constitutional:  Negative for appetite change, chills, fatigue, fever and unexpected weight change.  Eyes:  Negative for visual disturbance.  Respiratory:  Negative for cough, chest tightness, shortness of breath and wheezing.   Cardiovascular:  Negative for chest pain and leg swelling.  Gastrointestinal:  Negative for abdominal pain, constipation, diarrhea, nausea and vomiting.  Genitourinary:  Negative for dysuria, flank pain, frequency, genital sores, hematuria and urgency.  Skin:  Negative for rash.  Allergic/Immunologic: Negative for immunocompromised state.  Neurological:  Negative for dizziness and headaches.      Objective:    BP (!) 155/83   Pulse 78   Resp 16   Ht 5\' 9"  (1.753 m)   Wt 167 lb (75.8 kg)   SpO2 98%   BMI 24.66 kg/m  Nursing note and vital signs reviewed.  Physical Exam Constitutional:      General: He is not in acute distress.    Appearance: He is well-developed.  Eyes:     Conjunctiva/sclera:  Conjunctivae normal.  Cardiovascular:     Rate and Rhythm: Normal rate and regular rhythm.     Heart sounds: Normal heart sounds. No murmur heard.    No friction rub. No gallop.  Pulmonary:     Effort: Pulmonary effort is normal. No respiratory distress.     Breath sounds: Normal breath sounds. No wheezing or rales.  Chest:     Chest wall: No tenderness.  Abdominal:     General: Bowel sounds are normal.     Palpations: Abdomen is soft.     Tenderness: There is  no abdominal tenderness.  Musculoskeletal:     Cervical back: Neck supple.  Lymphadenopathy:     Cervical: No cervical adenopathy.  Skin:    General: Skin is warm and dry.     Findings: No rash.  Neurological:     Mental Status: He is alert and oriented to person, place, and time.  Psychiatric:        Behavior: Behavior normal.        Thought Content: Thought content normal.        Judgment: Judgment normal.         12/31/2021   10:12 AM 12/27/2021    8:04 AM  Depression screen PHQ 2/9  Decreased Interest 0 0  Down, Depressed, Hopeless 0 0  PHQ - 2 Score 0 0       Assessment & Plan:    Patient Active Problem List   Diagnosis Date Noted   Elevated BP without diagnosis of hypertension 12/28/2021   Constipation 12/07/2021   Hemorrhoids 11/17/2021   Healthcare maintenance 11/17/2021   HIV disease ([place]) 10/21/2021   Encounter for screening for HIV    High risk homosexual behavior    Syncope 15-Nov-2021   Diaphoresis Nov 15, 2021   FH: sudden cardiac death (SCD)    FH: premature coronary heart disease      Problem List Items Addressed This Visit       Cardiovascular and Mediastinum   Hemorrhoids    Plan for surgical removal with Gastroenterology.        Other   HIV disease ([place]) - Primary    Gary Barrett has well-controlled virus with good adherence and poor tolerance to [facility] with nausea and vomiting that is resolved when medication is stopped. Reviewed previous lab work and discussed medication options. Not currently interested in [place]. Discussed medication options and will try Delstrigo to see if that resolves his nausea. Check lab work today. Plan for follow up in 1 month or sooner if needed.       Relevant Medications   doravirin-lamivudin-tenofov df (DELSTRIGO) 100-300-300 MG TABS per tablet   Other Relevant Orders   HIV-1 RNA quant-no reflex-bld   Healthcare maintenance    Discussed importance of safe sexual practice and condom use. Condoms offered.   Encouraged routine dental care and can refer to [facility] clinic if needed.       Constipation    Constipation resolved with Miralax and continues to take medication as needed.         I have discontinued [place] Loguidice. Geraci's bictegravir-emtricitabine-tenofovir AF. I am also having him start on Delstrigo. Additionally, I am having him maintain his hydrocortisone, promethazine, and hydrocortisone.   Meds ordered this encounter  Medications   doravirin-lamivudin-tenofov df (DELSTRIGO) 100-300-300 MG TABS per tablet    Sig: Take 1 tablet by mouth daily.    Dispense:  30 tablet    Refill:  1    Order  Specific Question:   Supervising Provider    Answer:   Carlyle Basques [[id number]]     Follow-up: Return in about 1 month (around 01/30/2022).   Terri Piedra, MSN, FNP-C Nurse Practitioner [facility] [place] number: [phone]

## 2021-12-31 NOTE — Assessment & Plan Note (Signed)
Plan for surgical removal with Gastroenterology.

## 2021-12-31 NOTE — Assessment & Plan Note (Signed)
Constipation resolved with Miralax and continues to take medication as needed.

## 2021-12-31 NOTE — Patient Instructions (Signed)
Nice to see you.  We will check your lab work today.  Continue to take your medication daily as prescribed.  Refills have been sent to the pharmacy.  Plan for follow up in 1 months or sooner if needed with lab work on the same day.  Have a great day and stay safe!  

## 2022-01-03 DIAGNOSIS — F4381 Prolonged grief disorder: Secondary | ICD-10-CM | POA: Diagnosis not present

## 2022-01-04 LAB — HIV-1 RNA QUANT-NO REFLEX-BLD
HIV 1 RNA Quant: NOT DETECTED Copies/mL
HIV-1 RNA Quant, Log: NOT DETECTED Log cps/mL

## 2022-01-17 DIAGNOSIS — F4381 Prolonged grief disorder: Secondary | ICD-10-CM | POA: Diagnosis not present

## 2022-01-19 ENCOUNTER — Encounter: Payer: Self-pay | Admitting: Internal Medicine

## 2022-01-26 ENCOUNTER — Ambulatory Visit: Payer: BC Managed Care – PPO | Admitting: Family Medicine

## 2022-01-31 ENCOUNTER — Emergency Department (HOSPITAL_COMMUNITY): Payer: BC Managed Care – PPO

## 2022-01-31 ENCOUNTER — Encounter (HOSPITAL_COMMUNITY): Payer: Self-pay

## 2022-01-31 ENCOUNTER — Emergency Department (HOSPITAL_COMMUNITY)
Admission: EM | Admit: 2022-01-31 | Discharge: 2022-01-31 | Disposition: A | Discharge status: Home / Self Care | Payer: BC Managed Care – PPO | Attending: Emergency Medicine | Admitting: Emergency Medicine

## 2022-01-31 ENCOUNTER — Other Ambulatory Visit: Payer: Self-pay

## 2022-01-31 DIAGNOSIS — Z21 Asymptomatic human immunodeficiency virus [HIV] infection status: Secondary | ICD-10-CM | POA: Diagnosis not present

## 2022-01-31 DIAGNOSIS — J069 Acute upper respiratory infection, unspecified: Secondary | ICD-10-CM | POA: Diagnosis not present

## 2022-01-31 DIAGNOSIS — R079 Chest pain, unspecified: Secondary | ICD-10-CM | POA: Diagnosis not present

## 2022-01-31 DIAGNOSIS — R110 Nausea: Secondary | ICD-10-CM | POA: Diagnosis not present

## 2022-01-31 DIAGNOSIS — R0602 Shortness of breath: Secondary | ICD-10-CM | POA: Diagnosis not present

## 2022-01-31 DIAGNOSIS — Z20822 Contact with and (suspected) exposure to covid-19: Secondary | ICD-10-CM | POA: Diagnosis not present

## 2022-01-31 DIAGNOSIS — R0789 Other chest pain: Secondary | ICD-10-CM

## 2022-01-31 DIAGNOSIS — Z7951 Long term (current) use of inhaled steroids: Secondary | ICD-10-CM | POA: Diagnosis not present

## 2022-01-31 DIAGNOSIS — B9789 Other viral agents as the cause of diseases classified elsewhere: Secondary | ICD-10-CM | POA: Diagnosis not present

## 2022-01-31 DIAGNOSIS — R000 Tachycardia, unspecified: Secondary | ICD-10-CM | POA: Diagnosis not present

## 2022-01-31 LAB — BASIC METABOLIC PANEL
Anion gap: 7 (ref 5–15)
BUN: 14 mg/dL (ref 6–20)
CO2: 25 mmol/L (ref 22–32)
Calcium: 9.4 mg/dL (ref 8.9–10.3)
Chloride: 104 mmol/L (ref 98–111)
Creatinine, Ser: 1.18 mg/dL (ref 0.61–1.24)
GFR, Estimated: >60 mL/min (ref >60)
Glucose, Bld: 146 mg/dL — ABNORMAL HIGH (ref 70–99)
Potassium: 3.8 mmol/L (ref 3.5–5.1)
Sodium: 136 mmol/L (ref 135–145)

## 2022-01-31 LAB — CBC
HCT: 44.6 % (ref 39.0–52.0)
Hemoglobin: 15.2 g/dL (ref 13.0–17.0)
MCH: 31.3 pg (ref 26.0–34.0)
MCHC: 34.1 g/dL (ref 30.0–36.0)
MCV: 91.8 fL (ref 80.0–100.0)
Platelets: 188 10*3/uL (ref 150–400)
RBC: 4.86 MIL/uL (ref 4.22–5.81)
RDW: 14 % (ref 11.5–15.5)
WBC: 7.9 10*3/uL (ref 4.0–10.5)
nRBC: 0 % (ref 0.0–0.2)

## 2022-01-31 LAB — TROPONIN I (HIGH SENSITIVITY)
Troponin I (High Sensitivity): <2 ng/L (ref <18)
Troponin I (High Sensitivity): <2 ng/L (ref <18)

## 2022-01-31 LAB — RESP PANEL BY RT-PCR (FLU A&B, COVID) ARPGX2
Influenza A by PCR: NEGATIVE
Influenza B by PCR: NEGATIVE
SARS Coronavirus 2 by RT PCR: NEGATIVE

## 2022-01-31 MED ORDER — FLUTICASONE PROPIONATE 50 MCG/ACT NA SUSP: 1.0000 | Freq: Every day | NASAL | 2 refills | Status: DC

## 2022-01-31 MED ORDER — IBUPROFEN 600 MG PO TABS: 600.0000 mg | ORAL_TABLET | Freq: Four times a day (QID) | ORAL | 0 refills | Status: DC | PRN

## 2022-01-31 MED ORDER — DM-GUAIFENESIN ER 30-600 MG PO TB12: 1.0000 | ORAL_TABLET | Freq: Two times a day (BID) | ORAL | 0 refills | Status: DC | PRN

## 2022-01-31 MED ORDER — SODIUM CHLORIDE 0.9 % IV BOLUS
1000.0000 mL | Freq: Once | INTRAVENOUS | Status: AC
  Administered 2022-01-31: 1000 mL via INTRAVENOUS

## 2022-01-31 MED ORDER — IOHEXOL 350 MG/ML SOLN
75.0000 mL | Freq: Once | INTRAVENOUS | Status: AC | PRN
  Administered 2022-01-31: 75 mL via INTRAVENOUS

## 2022-01-31 MED ORDER — KETOROLAC TROMETHAMINE 30 MG/ML IJ SOLN
30.0000 mg | Freq: Once | INTRAMUSCULAR | Status: AC
Start: 2022-01-31 — End: 2022-01-31
  Administered 2022-01-31: 30 mg via INTRAVENOUS
  Filled 2022-01-31: qty 1

## 2022-01-31 NOTE — ED Provider Notes (Signed)
[facility] EMERGENCY DEPARTMENT Provider Note   CSN: [id number] Arrival date & time: 01/31/22  0300     History {Add pertinent medical, surgical, social history, OB history to HPI:1} Chief Complaint  Patient presents with   Chest Pain    Gary Barrett is a 31 y.o. male.  Pt is a 31 yo male with a pmhx significant for HIV (dx in March).  He has been having a lot of nausea and intolerance of his new HIV meds.  He was started on Biktarvy in March, but that was changed to [facility] on 6/3.  Pt said he has not been tolerating that and has not taken it in a few weeks.  Viral load undetectable in June.  CD4 cells 509 in April.  Pt presents to the ED today with right sided chest pain.  He has had some sob and nausea as well.  Pt denies f/c.  He's had a cough.  No known sick exposures.       Home Medications Prior to Admission medications   Medication Sig Start Date End Date Taking? Authorizing Provider  doravirin-lamivudin-tenofov df (DELSTRIGO) 100-300-300 MG TABS per tablet Take 1 tablet by mouth daily. 12/31/21   Veryl Speak, FNP  hydrocortisone (PROCTOZONE-HC) 2.5 % rectal cream Place 1 application. rectally 2 (two) times daily. 11/17/21   Veryl Speak, FNP  hydrocortisone 2.5 % cream Apply 1 application. topically 2 (two) times daily as needed. 12/27/21   [provider]  promethazine (PHENERGAN) 12.5 MG tablet Take 1 tablet (12.5 mg total) by mouth every 6 (six) hours as needed for nausea or vomiting. 12/23/21   Lanelle Bal, DO      Allergies    Patient has no known allergies.    Review of Systems   Review of Systems  Respiratory:  Positive for cough and shortness of breath.   Cardiovascular:  Positive for chest pain.  All other systems reviewed and are negative.   Physical Exam Updated Vital Signs BP 136/89   Pulse (!) 101   Temp 98.7 F (37.1 C)   Resp 13   Ht 5\' 9"  (1.753 m)   Wt 75.8 kg   SpO2 98%   BMI 24.68 kg/m  Physical Exam Vitals  and nursing note reviewed.  Constitutional:      Appearance: He is well-developed.  HENT:     Head: Normocephalic and atraumatic.  Eyes:     Pupils: Pupils are equal, round, and reactive to light.  Cardiovascular:     Rate and Rhythm: Regular rhythm. Tachycardia present.     Heart sounds: Normal heart sounds.  Pulmonary:     Effort: Pulmonary effort is normal.     Breath sounds: Normal breath sounds.  Abdominal:     General: Bowel sounds are normal.     Palpations: Abdomen is soft.  Musculoskeletal:        General: Normal range of motion.     Cervical back: Normal range of motion and neck supple.  Skin:    General: Skin is warm.     Capillary Refill: Capillary refill takes less than 2 seconds.  Neurological:     General: No focal deficit present.     Mental Status: He is alert and oriented to person, place, and time.  Psychiatric:        Mood and Affect: Mood normal.        Behavior: Behavior normal.     ED Results / Procedures / Treatments  Labs (all labs ordered are listed, but only abnormal results are displayed) Labs Reviewed  BASIC METABOLIC PANEL - Abnormal; Notable for the following components:      Result Value   Glucose, Bld 146 (*)    All other components within normal limits  RESP PANEL BY RT-PCR (FLU A&B, COVID) ARPGX2  CBC  TROPONIN I (HIGH SENSITIVITY)    EKG EKG Interpretation  Date/Time:  Monday January 31 2022 09:11:54 EDT Ventricular Rate:  109 PR Interval:  119 QRS Duration: 88 QT Interval:  313 QTC Calculation: 422 R Axis:   83 Text Interpretation: Sinus tachycardia Probable lateral infarct, age indeterminate Since last tracing rate faster Confirmed by Jacalyn Lefevre [phone]) on 01/31/2022 9:51:42 AM  Radiology DG Chest Port 1 View  Result Date: 01/31/2022 CLINICAL DATA:  A 31 year old presents with chest pain. EXAM: PORTABLE CHEST 1 VIEW COMPARISON:  Chest x-ray from October 17, 2021. FINDINGS: The heart size and mediastinal contours are  within normal limits. Both lungs are clear. The visualized skeletal structures are unremarkable. IMPRESSION: No active disease. Electronically Signed   By: Donzetta Kohut M.D.   On: 01/31/2022 09:54    Procedures Procedures  {Document cardiac monitor, telemetry assessment procedure when appropriate:1}  Medications Ordered in ED Medications  ketorolac (TORADOL) 30 MG/ML injection 30 mg (30 mg Intravenous Given 01/31/22 1015)  sodium chloride 0.9 % bolus 1,000 mL (1,000 mLs Intravenous New Bag/Given 01/31/22 1015)    ED Course/ Medical Decision Making/ A&P                           Medical Decision Making Amount and/or Complexity of Data Reviewed Labs: ordered. Radiology: ordered.  Risk Prescription drug management.   This patient presents to the ED for concern of cp, sob, this involves an extensive number of treatment options, and is a complaint that carries with it a high risk of complications and morbidity.  The differential diagnosis includes cad, pulm, gi   Co morbidities that complicate the patient evaluation  HIV   Additional history obtained:  Additional history obtained from epic chart review    Lab Tests:  I Ordered, and personally interpreted labs.  The pertinent results include:  cbc nl, bmp nl, trop <2   Imaging Studies ordered:  I ordered imaging studies including cxr and ct chest  I independently visualized and interpreted imaging which showed  Cxr: IMPRESSION:  No active disease.   I agree with the radiologist interpretation   Cardiac Monitoring:  The patient was maintained on a cardiac monitor.  I personally viewed and interpreted the cardiac monitored which showed an underlying rhythm of: sinus tachy   Medicines ordered and prescription drug management:  I ordered medication including toradol and ivfs  for pain and dehydration  Reevaluation of the patient after these medicines showed that the patient improved I have reviewed the patients  home medicines and have made adjustments as needed   Test Considered:  Ct chest   Critical Interventions:  ***   Consultations Obtained:  I requested consultation with the ***,  and discussed lab and imaging findings as well as pertinent plan - they recommend: ***   Problem List / ED Course:  CP:  unlikely cardiac.  Coronary CTA in March had a coronary calcium score of 0.  ECHO in March with EF 60-65% without any valvular disease.  EKG and trop nl today.   Reevaluation:  After the interventions noted above, I reevaluated  the patient and found that they have :{resolved/improved/worsened:23923::"improved"}   Social Determinants of Health:  ***   Dispostion:  After consideration of the diagnostic results and the patients response to treatment, I feel that the patent would benefit from ***.    {Document critical care time when appropriate:1} {Document review of labs and clinical decision tools ie heart score, Chads2Vasc2 etc:1}  {Document your independent review of radiology images, and any outside records:1} {Document your discussion with family members, caretakers, and with consultants:1} {Document social determinants of health affecting pt's care:1} {Document your decision making why or why not admission, treatments were needed:1} Final Clinical Impression(s) / ED Diagnoses Final diagnoses:  None    Rx / DC Orders ED Discharge Orders     None

## 2022-01-31 NOTE — ED Notes (Signed)
Pt gone to CT 

## 2022-01-31 NOTE — ED Triage Notes (Signed)
Patient reports chest pain with SHOB, nausea since last night. States that he has been seeing Cards for multiple episodes of syncope. Reports cough. States that he was recently had change in meds.

## 2022-02-01 ENCOUNTER — Ambulatory Visit (INDEPENDENT_AMBULATORY_CARE_PROVIDER_SITE_OTHER): Payer: BC Managed Care – PPO | Admitting: Family Medicine

## 2022-02-01 ENCOUNTER — Encounter: Payer: Self-pay | Admitting: Family Medicine

## 2022-02-01 VITALS — BP 140/84 | HR 84 | Ht 69.0 in | Wt 166.2 lb

## 2022-02-01 DIAGNOSIS — R7301 Impaired fasting glucose: Secondary | ICD-10-CM | POA: Diagnosis not present

## 2022-02-01 DIAGNOSIS — R030 Elevated blood-pressure reading, without diagnosis of hypertension: Secondary | ICD-10-CM

## 2022-02-01 NOTE — Patient Instructions (Signed)
I appreciate the opportunity to provide care to you today!    Follow up:  2 months  Labs: please stop by the lab today to get your blood drawn (HgA1c)     Please continue to a heart-healthy diet and increase your physical activities. Try to exercise for at least three times a week.      It was a pleasure to see you and I look forward to continuing to work together on your health and well-being. Please do not hesitate to call the office if you need care or have questions about your care.   Have a wonderful day and week. With Gratitude, Gilmore Laroche MSN, FNP-BC'

## 2022-02-01 NOTE — Progress Notes (Addendum)
Established Patient Office Visit  Subjective:  Patient ID: Gary Barrett, male    DOB: 18-Nov-1990  Age: 31 y.o. MRN: [id number]  CC:  Chief Complaint  Patient presents with   Follow-up    Pt following up, states he had a URI went to ED yesterday, was prescribed medications.    HPI Gary Barrett is a 31 y.o. male with past medical history of  HIV (dx in March) presents for f/u of chronic medical conditions.  HTN: Reports fluctuations in his BP. Ambulatory readings range from 120s-130s systolic, and diastolic is 70s-100s.    Past Medical History:  Diagnosis Date   HIV (human immunodeficiency virus infection) (HCC)     Past Surgical History:  Procedure Laterality Date   INNER EAR SURGERY      Family History  Problem Relation Age of Onset   Heart disease Mother    CAD Mother    Colon cancer Maternal Grandmother    Sudden death Cousin     Social History   Socioeconomic History   Marital status: Single    Spouse name: Not on file   Number of children: Not on file   Years of education: 16   Highest education level: Not on file  Occupational History   Not on file  Tobacco Use   Smoking status: Some Days    Packs/day: 0.10    Types: Cigarettes   Smokeless tobacco: Never  Vaping Use   Vaping Use: Never used  Substance and Sexual Activity   Alcohol use: Yes    Alcohol/week: 1.0 standard drink of alcohol    Types: 1 Glasses of wine per week    Comment: occ   Drug use: Yes    Types: Marijuana    Comment: occ   Sexual activity: Not on file    Comment: declined condoms  Other Topics Concern   Not on file  Social History Narrative   Not on file   Social Determinants of Health   Financial Resource Strain: Not on file  Food Insecurity: Not on file  Transportation Needs: Not on file  Physical Activity: Not on file  Stress: Not on file  Social Connections: Not on file  Intimate Partner Violence: Not on file    Outpatient Medications Prior to  Visit  Medication Sig Dispense Refill   acetaminophen (TYLENOL) 325 MG tablet Take 650 mg by mouth every 6 (six) hours as needed for mild pain, moderate pain, fever or headache.     dextromethorphan-guaiFENesin (MUCINEX DM) 30-600 MG 12hr tablet Take 1 tablet by mouth 2 (two) times daily as needed for cough (20). 10 tablet 0   docusate sodium (COLACE) 100 MG capsule Take 100 mg by mouth 2 (two) times daily.     doravirin-lamivudin-tenofov df (DELSTRIGO) 100-300-300 MG TABS per tablet Take 1 tablet by mouth daily. 30 tablet 1   fluticasone (FLONASE) 50 MCG/ACT nasal spray Place 1 spray into both nostrils daily. 11.1 mL 2   ibuprofen (ADVIL) 600 MG tablet Take 1 tablet (600 mg total) by mouth every 6 (six) hours as needed. 30 tablet 0   promethazine (PHENERGAN) 12.5 MG tablet Take 1 tablet (12.5 mg total) by mouth every 6 (six) hours as needed for nausea or vomiting. 30 tablet 1   No facility-administered medications prior to visit.    No Known Allergies  ROS Review of Systems  Constitutional:  Negative for fatigue and fever.  Eyes:  Negative for visual disturbance.  Respiratory:  Negative  for shortness of breath.   Cardiovascular:  Negative for palpitations.  Neurological:  Negative for dizziness and headaches.      Objective:    Physical Exam HENT:     Head: Normocephalic.  Cardiovascular:     Rate and Rhythm: Normal rate and regular rhythm.     Pulses: Normal pulses.     Heart sounds: Normal heart sounds.  Pulmonary:     Breath sounds: Normal breath sounds.  Neurological:     Mental Status: He is alert.     BP 140/84   Pulse 84   Ht 5\' 9"  (1.753 m)   Wt 166 lb 3.2 oz (75.4 kg)   SpO2 96%   BMI 24.54 kg/m  Wt Readings from Last 3 Encounters:  02/01/22 166 lb 3.2 oz (75.4 kg)  01/31/22 167 lb 1.7 oz (75.8 kg)  12/31/21 167 lb (75.8 kg)    Lab Results  Component Value Date   TSH 2.204 10/18/2021   Lab Results  Component Value Date   WBC 7.9 01/31/2022   HGB  15.2 01/31/2022   HCT 44.6 01/31/2022   MCV 91.8 01/31/2022   PLT 188 01/31/2022   Lab Results  Component Value Date   NA 136 01/31/2022   K 3.8 01/31/2022   CO2 25 01/31/2022   GLUCOSE 146 (H) 01/31/2022   BUN 14 01/31/2022   CREATININE 1.18 01/31/2022   BILITOT 0.7 10/18/2021   ALKPHOS 69 10/18/2021   AST 23 10/18/2021   ALT 29 10/18/2021   PROT 8.0 10/18/2021   ALBUMIN 3.8 10/18/2021   CALCIUM 9.4 01/31/2022   ANIONGAP 7 01/31/2022   Lab Results  Component Value Date   CHOL 167 10/18/2021   Lab Results  Component Value Date   HDL 21 (L) 10/18/2021   Lab Results  Component Value Date   LDLCALC 121 (H) 10/18/2021   Lab Results  Component Value Date   TRIG 127 10/18/2021   Lab Results  Component Value Date   CHOLHDL 8.0 10/18/2021   Lab Results  Component Value Date   HGBA1C 5.4 10/18/2021      Assessment & Plan:   Problem List Items Addressed This Visit       Other   Elevated BP without diagnosis of hypertension - Primary    Reports fluctuations in his BP Will continue to monitor BP BP Readings from Last 3 Encounters:  02/01/22 140/84  01/31/22 127/65  12/31/21 (!) 155/83         Other Visit Diagnoses     IFG (impaired fasting glucose)       Relevant Orders   Hemoglobin A1C       No orders of the defined types were placed in this encounter.   Follow-up: Return in about 2 months (around 04/04/2022) for CPE.    06/04/2022, FNP

## 2022-02-01 NOTE — Assessment & Plan Note (Signed)
Reports fluctuations in his BP Will continue to monitor BP BP Readings from Last 3 Encounters:  02/01/22 140/84  01/31/22 127/65  12/31/21 (!) 155/83

## 2022-02-02 ENCOUNTER — Other Ambulatory Visit: Payer: Self-pay | Admitting: Family Medicine

## 2022-02-02 ENCOUNTER — Ambulatory Visit: Payer: BC Managed Care – PPO

## 2022-02-02 ENCOUNTER — Other Ambulatory Visit: Payer: Self-pay

## 2022-02-02 ENCOUNTER — Encounter: Payer: Self-pay | Admitting: Family

## 2022-02-02 ENCOUNTER — Ambulatory Visit: Payer: BC Managed Care – PPO | Admitting: Family

## 2022-02-02 ENCOUNTER — Ambulatory Visit (INDEPENDENT_AMBULATORY_CARE_PROVIDER_SITE_OTHER): Payer: BC Managed Care – PPO | Admitting: Family

## 2022-02-02 VITALS — BP 124/81 | HR 84 | Temp 98.3°F | Wt 165.0 lb

## 2022-02-02 DIAGNOSIS — B20 Human immunodeficiency virus [HIV] disease: Secondary | ICD-10-CM | POA: Diagnosis not present

## 2022-02-02 DIAGNOSIS — Z0000 Encounter for general adult medical examination without abnormal findings: Secondary | ICD-10-CM

## 2022-02-02 LAB — HEMOGLOBIN A1C
Est. average glucose Bld gHb Est-mCnc: 111 mg/dL
Hgb A1c MFr Bld: 5.5 % (ref 4.8–5.6)

## 2022-02-02 MED ORDER — RALTEGRAVIR POTASSIUM 600 MG PO TABS: 1200.0000 mg | ORAL_TABLET | Freq: Every day | ORAL | 1 refills | Status: DC

## 2022-02-02 MED ORDER — EMTRICITABINE-TENOFOVIR DF 200-300 MG PO TABS: 1.0000 | ORAL_TABLET | Freq: Every day | ORAL | 11 refills | Status: DC

## 2022-02-02 NOTE — Assessment & Plan Note (Signed)
Gary Barrett continues to have well-controlled virus with good adherence and poor tolerance to his ART regimens having not tolerated Dovato, Biktarvy, and Delstrigo so far.  This was despite addition of promethazine and ondansetron for nausea.  Not currently interested in injectable medications although will consider if no other treatment options.  Discussed the previous medication regimens are the least in terms of side effects.  We will try Isentress and Truvada to see if he still continues to have reactions with those.  He will continue the promethazine as needed for nausea.  Check lab work today.  Plan for follow-up in 1 month or sooner if needed.

## 2022-02-02 NOTE — Patient Instructions (Addendum)
Nice to see you.  We will check your lab work today.  Continue to take your medication daily as prescribed.  Can increase promethazine to 25 mg daily if needed.   Plan for follow up in 1 months or sooner if needed with lab work on the same day.  Have a great day and stay safe!

## 2022-02-02 NOTE — Progress Notes (Signed)
  Please inform the patient that he does not have diabetes. His hgA1c is within the normal range

## 2022-02-02 NOTE — Progress Notes (Signed)
Brief Narrative   Patient ID: Gary Barrett, male    DOB: 1990/12/10, 31 y.o.   MRN: [id number]  Gary Barrett is a 31 y/o male with HIV-1 disease diagnosed on 10/17/21 with risk factor of MSM. Initial viral load was 40,500 with CD4 count of 199. Genotype with no significant medication resistant mutations. No history of opportunistic infection. [id number] negative. Entered care at [facility] Stage 3. Changed from Dovato (N/V/D) to [facility] and then Delstrigo.   Subjective:    Chief Complaint  Patient presents with   HIV Positive/AIDS    HPI:  Gary Barrett is a 31 y.o. male with HIV disease last seen on 12/31/2021 with well-controlled virus and good adherence and poor tolerance to his ART regimen of Biktarvy.  Medication switched to Delstrigo.  Viral load was undetectable with CD4 count of 509.  Was recently seen in the ED for viral infection and informed he was not taking medication secondary to adverse side effects.  Here today for follow-up.  Gary Barrett has not been taking his Delstrigo secondary to adverse side effects of cloudy vision and continued nausea/vomiting.  He attempted to take with food and also with promethazine which did not help significantly.  Feeling well today with concern for medication regimen. Denies fevers, chills, night sweats, headaches, changes in vision, neck pain/stiffness, nausea, diarrhea, vomiting, lesions or rashes no problems obtaining medication from the pharmacy and remains covered by [facility].  No Known Allergies    Outpatient Medications Prior to Visit  Medication Sig Dispense Refill   dextromethorphan-guaiFENesin (MUCINEX DM) 30-600 MG 12hr tablet Take 1 tablet by mouth 2 (two) times daily as needed for cough (20). 10 tablet 0   docusate sodium (COLACE) 100 MG capsule Take 100 mg by mouth 2 (two) times daily.     fluticasone (FLONASE) 50 MCG/ACT nasal spray Place 1 spray into both nostrils daily. 11.1 mL 2   ibuprofen (ADVIL) 600  MG tablet Take 1 tablet (600 mg total) by mouth every 6 (six) hours as needed. 30 tablet 0   doravirin-lamivudin-tenofov df (DELSTRIGO) 100-300-300 MG TABS per tablet Take 1 tablet by mouth daily. 30 tablet 1   acetaminophen (TYLENOL) 325 MG tablet Take 650 mg by mouth every 6 (six) hours as needed for mild pain, moderate pain, fever or headache. (Patient not taking: Reported on 02/02/2022)     promethazine (PHENERGAN) 12.5 MG tablet Take 1 tablet (12.5 mg total) by mouth every 6 (six) hours as needed for nausea or vomiting. (Patient not taking: Reported on 02/02/2022) 30 tablet 1   No facility-administered medications prior to visit.     Past Medical History:  Diagnosis Date   HIV (human immunodeficiency virus infection) (HCC)      Past Surgical History:  Procedure Laterality Date   INNER EAR SURGERY        Review of Systems  Constitutional:  Negative for appetite change, chills, fatigue, fever and unexpected weight change.  Eyes:  Negative for visual disturbance.  Respiratory:  Negative for cough, chest tightness, shortness of breath and wheezing.   Cardiovascular:  Negative for chest pain and leg swelling.  Gastrointestinal:  Negative for abdominal pain, constipation, diarrhea, nausea and vomiting.  Genitourinary:  Negative for dysuria, flank pain, frequency, genital sores, hematuria and urgency.  Skin:  Negative for rash.  Allergic/Immunologic: Negative for immunocompromised state.  Neurological:  Negative for dizziness and headaches.      Objective:    BP 124/81   Pulse  84   Temp 98.3 F (36.8 C) (Oral)   Wt 165 lb (74.8 kg)   SpO2 97%   BMI 24.37 kg/m  Nursing note and vital signs reviewed.  Physical Exam Constitutional:      General: He is not in acute distress.    Appearance: He is well-developed.  Eyes:     Conjunctiva/sclera: Conjunctivae normal.  Cardiovascular:     Rate and Rhythm: Normal rate and regular rhythm.     Heart sounds: Normal heart sounds.  No murmur heard.    No friction rub. No gallop.  Pulmonary:     Effort: Pulmonary effort is normal. No respiratory distress.     Breath sounds: Normal breath sounds. No wheezing or rales.  Chest:     Chest wall: No tenderness.  Abdominal:     General: Bowel sounds are normal.     Palpations: Abdomen is soft.     Tenderness: There is no abdominal tenderness.  Musculoskeletal:     Cervical back: Neck supple.  Lymphadenopathy:     Cervical: No cervical adenopathy.  Skin:    General: Skin is warm and dry.     Findings: No rash.  Neurological:     Mental Status: He is alert and oriented to person, place, and time.  Psychiatric:        Behavior: Behavior normal.        Thought Content: Thought content normal.        Judgment: Judgment normal.         02/01/2022   10:16 AM 12/31/2021   10:12 AM 12/27/2021    8:04 AM  Depression screen PHQ 2/9  Decreased Interest 0 0 0  Down, Depressed, Hopeless 0 0 0  PHQ - 2 Score 0 0 0       Assessment & Plan:    Patient Active Problem List   Diagnosis Date Noted   Elevated BP without diagnosis of hypertension 12/28/2021   Constipation 12/07/2021   Hemorrhoids 11/17/2021   Healthcare maintenance 11/17/2021   HIV disease ([place]) 10/21/2021   Encounter for screening for HIV    High risk homosexual behavior    Syncope 22-Oct-2021   Diaphoresis 2021/10/22   FH: sudden cardiac death (SCD)    FH: premature coronary heart disease      Problem List Items Addressed This Visit       Other   HIV disease ([place]) - Primary    Gary Barrett continues to have well-controlled virus with good adherence and poor tolerance to his ART regimens having not tolerated Dovato, Biktarvy, and Delstrigo so far.  This was despite addition of promethazine and ondansetron for nausea.  Not currently interested in injectable medications although will consider if no other treatment options.  Discussed the previous medication regimens are the least in terms of side  effects.  We will try Isentress and Truvada to see if he still continues to have reactions with those.  He will continue the promethazine as needed for nausea.  Check lab work today.  Plan for follow-up in 1 month or sooner if needed.      Relevant Medications   emtricitabine-tenofovir (TRUVADA) 200-300 MG tablet   Raltegravir Potassium 600 MG TABS   Other Relevant Orders   HIV-1 RNA quant-no reflex-bld   T-helper cell (CD4)- ([facility] clinic only)   Healthcare maintenance    Discussed importance of safe sexual practices and condom use.  Condoms offered.        I have discontinued  Madlyn Frankel Delfin's Delstrigo. I am also having him start on emtricitabine-tenofovir and Raltegravir Potassium. Additionally, I am having him maintain his promethazine, docusate sodium, acetaminophen, ibuprofen, dextromethorphan-guaiFENesin, and fluticasone.   Meds ordered this encounter  Medications   emtricitabine-tenofovir (TRUVADA) 200-300 MG tablet    Sig: Take 1 tablet by mouth daily.    Dispense:  30 tablet    Refill:  11    Order Specific Question:   Supervising Provider    Answer:   Judyann Munson [[id number]]   Raltegravir Potassium 600 MG TABS    Sig: Take 1,200 mg by mouth daily.    Dispense:  60 tablet    Refill:  1    Order Specific Question:   Supervising Provider    Answer:   Judyann Munson [[id number]]     Follow-up: Return in about 1 month (around 03/05/2022), or if symptoms worsen or fail to improve.   Marcos Eke, MSN, FNP-C Nurse Practitioner [facility] for Infectious Disease [facility] Main number: [phone]

## 2022-02-02 NOTE — Assessment & Plan Note (Signed)
Discussed importance of safe sexual practices and condom use.  Condoms offered. ?

## 2022-02-03 ENCOUNTER — Telehealth: Payer: Self-pay

## 2022-02-03 ENCOUNTER — Encounter: Payer: Self-pay | Admitting: Family

## 2022-02-03 DIAGNOSIS — F4381 Prolonged grief disorder: Secondary | ICD-10-CM | POA: Diagnosis not present

## 2022-02-03 LAB — T-HELPER CELL (CD4) - (RCID CLINIC ONLY)
CD4 % Helper T Cell: 34 % (ref 33–65)
CD4 T Cell Abs: 554 /uL (ref 400–1790)

## 2022-02-03 NOTE — Telephone Encounter (Signed)
I returned the pt's call and he has a few issues he wants to discuss with you. The first is he wants to come in her sooner than 03/03/2022 because his Dr that is treating his HIV wants him not to take his medication until he has seen [place] because the Dr thinks it is GI related that the pt is experiencing. The pt also states his nausea medication is not helping so therefore he wants to get an endoscopy done. Pt states he does not want to wait to see you while being off his HIV medication. Please advise

## 2022-02-03 NOTE — Telephone Encounter (Signed)
error 

## 2022-02-03 NOTE — Telephone Encounter (Signed)
Pt called and LMOVM regarding his appt with you being too far out and he was suppose to be seen sooner than August. Will call pt after my lunch

## 2022-02-04 ENCOUNTER — Other Ambulatory Visit (HOSPITAL_COMMUNITY): Payer: Self-pay

## 2022-02-04 NOTE — Telephone Encounter (Signed)
If patient is agreeable, please schedule him for upper endoscopy to further evaluate his nausea and vomiting.  ASA 2.  If he wishes to see me first in clinic then  okay to schedule urgent office visit with me.

## 2022-02-06 LAB — HIV-1 RNA QUANT-NO REFLEX-BLD
HIV 1 RNA Quant: NOT DETECTED Copies/mL
HIV-1 RNA Quant, Log: NOT DETECTED Log cps/mL

## 2022-02-07 NOTE — Telephone Encounter (Signed)
Called pt. No answer and not able to leave VM

## 2022-02-07 NOTE — Telephone Encounter (Signed)
noted 

## 2022-02-07 NOTE — Telephone Encounter (Signed)
Called pt/ he is scheduled for EGD tomorrow at 1pm. Aware will send instructions to mychart.

## 2022-02-07 NOTE — Telephone Encounter (Signed)
Pt is agreeable to having EGD done and also a urgent ov to be seen.

## 2022-02-08 ENCOUNTER — Encounter (HOSPITAL_COMMUNITY)
Admission: RE | Disposition: A | Discharge status: Home / Self Care | Payer: Self-pay | Source: Home / Self Care | Attending: Internal Medicine

## 2022-02-08 ENCOUNTER — Other Ambulatory Visit: Payer: Self-pay

## 2022-02-08 ENCOUNTER — Ambulatory Visit (HOSPITAL_COMMUNITY)
Admission: RE | Admit: 2022-02-08 | Discharge: 2022-02-08 | Disposition: A | Discharge status: Home / Self Care | Payer: BC Managed Care – PPO | Attending: Internal Medicine | Admitting: Internal Medicine

## 2022-02-08 ENCOUNTER — Ambulatory Visit (HOSPITAL_COMMUNITY): Payer: BC Managed Care – PPO | Admitting: Anesthesiology

## 2022-02-08 ENCOUNTER — Telehealth: Payer: Self-pay | Admitting: Internal Medicine

## 2022-02-08 DIAGNOSIS — K219 Gastro-esophageal reflux disease without esophagitis: Secondary | ICD-10-CM | POA: Insufficient documentation

## 2022-02-08 DIAGNOSIS — K2970 Gastritis, unspecified, without bleeding: Secondary | ICD-10-CM | POA: Diagnosis not present

## 2022-02-08 DIAGNOSIS — Z21 Asymptomatic human immunodeficiency virus [HIV] infection status: Secondary | ICD-10-CM | POA: Insufficient documentation

## 2022-02-08 DIAGNOSIS — R0789 Other chest pain: Secondary | ICD-10-CM | POA: Insufficient documentation

## 2022-02-08 DIAGNOSIS — R109 Unspecified abdominal pain: Secondary | ICD-10-CM

## 2022-02-08 DIAGNOSIS — R112 Nausea with vomiting, unspecified: Secondary | ICD-10-CM

## 2022-02-08 DIAGNOSIS — R000 Tachycardia, unspecified: Secondary | ICD-10-CM | POA: Insufficient documentation

## 2022-02-08 DIAGNOSIS — F17210 Nicotine dependence, cigarettes, uncomplicated: Secondary | ICD-10-CM | POA: Diagnosis not present

## 2022-02-08 DIAGNOSIS — R1013 Epigastric pain: Secondary | ICD-10-CM | POA: Diagnosis not present

## 2022-02-08 DIAGNOSIS — K5900 Constipation, unspecified: Secondary | ICD-10-CM

## 2022-02-08 HISTORY — PX: BIOPSY: SHX5522

## 2022-02-08 HISTORY — PX: ESOPHAGOGASTRODUODENOSCOPY (EGD) WITH PROPOFOL: SHX5813

## 2022-02-08 SURGERY — ESOPHAGOGASTRODUODENOSCOPY (EGD) WITH PROPOFOL
Anesthesia: General

## 2022-02-08 MED ORDER — PROPOFOL 10 MG/ML IV BOLUS
INTRAVENOUS | Status: DC | PRN
  Administered 2022-02-08: 100 mg via INTRAVENOUS
  Administered 2022-02-08: 150 mg via INTRAVENOUS
  Administered 2022-02-08: 50 mg via INTRAVENOUS

## 2022-02-08 MED ORDER — LACTATED RINGERS IV SOLN
INTRAVENOUS | Status: DC
Start: 2022-02-08 — End: 2022-02-08

## 2022-02-08 MED ORDER — LIDOCAINE HCL (CARDIAC) PF 100 MG/5ML IV SOSY
PREFILLED_SYRINGE | INTRAVENOUS | Status: DC | PRN
  Administered 2022-02-08: 50 mg via INTRAVENOUS

## 2022-02-08 NOTE — H&P (Signed)
Primary Care Physician:  Gilmore Laroche, FNP Primary Gastroenterologist:  Dr. Marletta Lor  Pre-Procedure History & Physical: HPI:  Gary Barrett is a 31 y.o. male is here  for an EGD to be performed for nausea vomiting, epigastric/chest pain.  Past Medical History:  Diagnosis Date   HIV (human immunodeficiency virus infection) (HCC)     Past Surgical History:  Procedure Laterality Date   INNER EAR SURGERY      Prior to Admission medications   Medication Sig Start Date End Date Taking? Authorizing Provider  acetaminophen (TYLENOL) 325 MG tablet Take 650 mg by mouth every 6 (six) hours as needed for mild pain, moderate pain, fever or headache.   Yes [provider]  dextromethorphan-guaiFENesin (MUCINEX DM) 30-600 MG 12hr tablet Take 1 tablet by mouth 2 (two) times daily as needed for cough (20). 01/31/22  Yes Jacalyn Lefevre, MD  docusate sodium (COLACE) 100 MG capsule Take 100 mg by mouth 2 (two) times daily.   Yes [provider]  emtricitabine-tenofovir (TRUVADA) 200-300 MG tablet Take 1 tablet by mouth daily. 02/02/22  Yes Veryl Speak, FNP  fluticasone (FLONASE) 50 MCG/ACT nasal spray Place 1 spray into both nostrils daily. 01/31/22  Yes Jacalyn Lefevre, MD  ibuprofen (ADVIL) 600 MG tablet Take 1 tablet (600 mg total) by mouth every 6 (six) hours as needed. 01/31/22  Yes Jacalyn Lefevre, MD  promethazine (PHENERGAN) 12.5 MG tablet Take 1 tablet (12.5 mg total) by mouth every 6 (six) hours as needed for nausea or vomiting. 12/23/21  Yes Lanelle Bal, DO  Raltegravir Potassium 600 MG TABS Take 1,200 mg by mouth daily. 02/02/22  Yes Veryl Speak, FNP    Allergies as of 02/07/2022   (No Known Allergies)    Family History  Problem Relation Age of Onset   Heart disease Mother    CAD Mother    Colon cancer Maternal Grandmother    Sudden death Cousin     Social History   Socioeconomic History   Marital status: Single    Spouse name: Not on file    Number of children: Not on file   Years of education: 16   Highest education level: Not on file  Occupational History   Not on file  Tobacco Use   Smoking status: Some Days    Packs/day: 0.10    Types: Cigarettes   Smokeless tobacco: Never  Vaping Use   Vaping Use: Never used  Substance and Sexual Activity   Alcohol use: Yes    Alcohol/week: 1.0 standard drink of alcohol    Types: 1 Glasses of wine per week    Comment: occ   Drug use: Yes    Types: Marijuana    Comment: occ   Sexual activity: Not on file    Comment: declined condoms  Other Topics Concern   Not on file  Social History Narrative   Not on file   Social Determinants of Health   Financial Resource Strain: Not on file  Food Insecurity: Not on file  Transportation Needs: Not on file  Physical Activity: Not on file  Stress: Not on file  Social Connections: Not on file  Intimate Partner Violence: Not on file    Review of Systems: General: Negative for fever, chills, fatigue, weakness. Eyes: Negative for vision changes.  ENT: Negative for hoarseness, difficulty swallowing , nasal congestion. CV: Negative for chest pain, angina, palpitations, dyspnea on exertion, peripheral edema.  Respiratory: Negative for dyspnea at rest, dyspnea on  exertion, cough, sputum, wheezing.  GI: See history of present illness. GU:  Negative for dysuria, hematuria, urinary incontinence, urinary frequency, nocturnal urination.  MS: Negative for joint pain, low back pain.  Derm: Negative for rash or itching.  Neuro: Negative for weakness, abnormal sensation, seizure, frequent headaches, memory loss, confusion.  Psych: Negative for anxiety, depression Endo: Negative for unusual weight change.  Heme: Negative for bruising or bleeding. Allergy: Negative for rash or hives.  Physical Exam: Vital signs in last 24 hours: Temp:  [98.2 F (36.8 C)] 98.2 F (36.8 C) (07/18 1147) Pulse Rate:  [76] 76 (07/18 1147) Resp:  [12] 12 (07/18  1147) BP: (145)/(82) 145/82 (07/18 1147) SpO2:  [100 %] 100 % (07/18 1147) Weight:  [74.8 kg] 74.8 kg (07/18 1147)   General:   Alert,  Well-developed, well-nourished, pleasant and cooperative in NAD Head:  Normocephalic and atraumatic. Eyes:  Sclera clear, no icterus.   Conjunctiva pink. Ears:  Normal auditory acuity. Nose:  No deformity, discharge,  or lesions. Mouth:  No deformity or lesions, dentition normal. Neck:  Supple; no masses or thyromegaly. Lungs:  Clear throughout to auscultation.   No wheezes, crackles, or rhonchi. No acute distress. Heart:  Regular rate and rhythm; no murmurs, clicks, rubs,  or gallops. Abdomen:  Soft, nontender and nondistended. No masses, hepatosplenomegaly or hernias noted. Normal bowel sounds, without guarding, and without rebound.   Msk:  Symmetrical without gross deformities. Normal posture. Extremities:  Without clubbing or edema. Neurologic:  Alert and  oriented x4;  grossly normal neurologically. Skin:  Intact without significant lesions or rashes. Cervical Nodes:  No significant cervical adenopathy. Psych:  Alert and cooperative. Normal mood and affect.   Impression/Plan: Gary Barrett is here for an EGD to be performed for nausea vomiting, epigastric/chest pain.  Risks, benefits, limitations, imponderables and alternatives regarding EGD have been reviewed with the patient. Questions have been answered. All parties agreeable.

## 2022-02-08 NOTE — Anesthesia Preprocedure Evaluation (Addendum)
Anesthesia Evaluation  Patient identified by MRN, date of birth, ID band Patient awake    Reviewed: Allergy & Precautions, NPO status , Patient's Chart, lab work & pertinent test results  Airway Mallampati: II  TM Distance: >3 FB Neck ROM: Full    Dental  (+) Dental Advisory Given, Teeth Intact   Pulmonary neg pulmonary ROS, Current Smoker,    Pulmonary exam normal breath sounds clear to auscultation       Cardiovascular Normal cardiovascular exam+ dysrhythmias (tachycardia)  Rhythm:Regular Rate:Normal     Neuro/Psych negative neurological ROS  negative psych ROS   GI/Hepatic negative GI ROS, (+)     substance abuse  marijuana use,   Endo/Other  negative endocrine ROS  Renal/GU negative Renal ROS  negative genitourinary   Musculoskeletal negative musculoskeletal ROS (+)   Abdominal   Peds negative pediatric ROS (+)  Hematology  (+) HIV,   Anesthesia Other Findings   Reproductive/Obstetrics negative OB ROS                            Anesthesia Physical Anesthesia Plan  ASA: 2  Anesthesia Plan: General   Post-op Pain Management: Minimal or no pain anticipated   Induction: Intravenous  PONV Risk Score and Plan: Propofol infusion  Airway Management Planned: Nasal Cannula and Natural Airway  Additional Equipment:   Intra-op Plan:   Post-operative Plan:   Informed Consent: I have reviewed the patients History and Physical, chart, labs and discussed the procedure including the risks, benefits and alternatives for the proposed anesthesia with the patient or authorized representative who has indicated his/her understanding and acceptance.     Dental advisory given  Plan Discussed with: CRNA and Surgeon  Anesthesia Plan Comments:         Anesthesia Quick Evaluation

## 2022-02-08 NOTE — Op Note (Signed)
[facility] Patient Name: Gary Barrett Procedure Date: 02/08/2022 11:50 AM MRN: [id number] Date of Birth: 07/15/1991 Attending MD: Elon Alas. Abbey Chatters DO CSN: [id number] Age: 31 Admit Type: Outpatient Procedure:                Upper GI endoscopy Indications:              Epigastric abdominal pain, Chest pain (non                            cardiac), Nausea with vomiting Providers:                Elon Alas. Abbey Chatters, DO, Caprice Kluver, Casimer Bilis, Technician Referring MD:              Medicines:                See the Anesthesia note for documentation of the                            administered medications Complications:            No immediate complications. Estimated Blood Loss:     Estimated blood loss was minimal. Procedure:                Pre-Anesthesia Assessment:                           - The anesthesia plan was to use monitored                            anesthesia care (MAC).                           After obtaining informed consent, the endoscope was                            passed under direct vision. Throughout the                            procedure, the patient's blood pressure, pulse, and                            oxygen saturations were monitored continuously. The                            [id number]-H190 ([id number]) scope was introduced through the                            mouth, and advanced to the second part of duodenum.                            The upper GI endoscopy was accomplished without                            difficulty. The patient tolerated  the procedure                            well. Scope In: 12:05:46 PM Scope Out: 12:10:27 PM Total Procedure Duration: 0 hours 4 minutes 41 seconds  Findings:      There is no endoscopic evidence of bleeding, areas of erosion,       esophagitis, ulcerations or varices in the entire esophagus.      Biopsies were taken with a cold forceps in the middle third of the        esophagus for histology.      Patchy mild inflammation characterized by erythema was found in the       gastric body. Biopsies were taken with a cold forceps for Helicobacter       pylori testing.      The duodenal bulb, first portion of the duodenum and second portion of       the duodenum were normal. Impression:               - Gastritis. Biopsied.                           - Normal duodenal bulb, first portion of the                            duodenum and second portion of the duodenum.                           - Biopsies were taken with a cold forceps for                            histology in the middle third of the esophagus. Moderate Sedation:      Per Anesthesia Care Recommendation:           - Patient has a contact number available for                            emergencies. The signs and symptoms of potential                            delayed complications were discussed with the                            patient. Return to normal activities tomorrow.                            Written discharge instructions were provided to the                            patient.                           - Resume previous diet.                           - Continue present medications.                           -  Await pathology results.                           - Return to GI clinic in 4 months. Procedure Code(s):        --- Professional ---                           [phone], Esophagogastroduodenoscopy, flexible,                            transoral; with biopsy, single or multiple Diagnosis Code(s):        --- Professional ---                           K29.70, Gastritis, unspecified, without bleeding                           R10.13, Epigastric pain                           R07.89, Other chest pain                           R11.2, Nausea with vomiting, unspecified CPT copyright 2019 American Medical Association. All rights reserved. The codes documented in this report are  preliminary and upon coder review may  be revised to meet current compliance requirements. Elon Alas. Abbey Chatters, DO Rodey Abbey Chatters, DO 02/08/2022 12:15:32 PM This report has been signed electronically. Number of Addenda: 0

## 2022-02-08 NOTE — Transfer of Care (Signed)
Immediate Anesthesia Transfer of Care Note  Patient: Gary Barrett  Procedure(s) Performed: ESOPHAGOGASTRODUODENOSCOPY (EGD) WITH PROPOFOL BIOPSY  Patient Location: Endoscopy Unit  Anesthesia Type:General  Level of Consciousness: drowsy  Airway & Oxygen Therapy: Patient Spontanous Breathing  Post-op Assessment: Report given to RN and Post -op Vital signs reviewed and stable  Post vital signs: Reviewed and stable  Last Vitals:  Vitals Value Taken Time  BP    Temp    Pulse    Resp    SpO2      Last Pain:  Vitals:   02/08/22 1203  TempSrc:   PainSc: 0-No pain      Patients Stated Pain Goal: 9 (02/08/22 1147)  Complications: No notable events documented.

## 2022-02-08 NOTE — Telephone Encounter (Signed)
Called pt and is aware of [place] appt details

## 2022-02-08 NOTE — Anesthesia Procedure Notes (Signed)
Date/Time: 02/08/2022 12:07 PM  Performed by: Julian Reil, CRNAPre-anesthesia Checklist: Patient identified, Emergency Drugs available, Suction available and Patient being monitored Patient Re-evaluated:Patient Re-evaluated prior to induction Oxygen Delivery Method: Nasal cannula Placement Confirmation: positive ETCO2

## 2022-02-08 NOTE — Anesthesia Postprocedure Evaluation (Signed)
Anesthesia Post Note  Patient: Gary Barrett  Procedure(s) Performed: ESOPHAGOGASTRODUODENOSCOPY (EGD) WITH PROPOFOL BIOPSY  Patient location during evaluation: Phase II Anesthesia Type: General Level of consciousness: awake and alert and oriented Pain management: pain level controlled Vital Signs Assessment: post-procedure vital signs reviewed and stable Respiratory status: spontaneous breathing, nonlabored ventilation and respiratory function stable Cardiovascular status: blood pressure returned to baseline and stable Postop Assessment: no apparent nausea or vomiting Anesthetic complications: no   No notable events documented.   Last Vitals:  Vitals:   02/08/22 1147 02/08/22 1216  BP: (!) 145/82 107/63  Pulse: 76   Resp: 12 16  Temp: 36.8 C 36.5 C  SpO2: 100% 96%    Last Pain:  Vitals:   02/08/22 1216  TempSrc: Oral  PainSc: 0-No pain                 Jaecion Dempster C Murlean Seelye

## 2022-02-08 NOTE — Addendum Note (Signed)
Addended by: Armstead Peaks on: 02/08/2022 02:43 PM   Modules accepted: Orders

## 2022-02-08 NOTE — Discharge Instructions (Addendum)
EGD Discharge instructions Please read the instructions outlined below and refer to this sheet in the next few weeks. These discharge instructions provide you with general information on caring for yourself after you leave the hospital. Your doctor may also give you specific instructions. While your treatment has been planned according to the most current medical practices available, unavoidable complications occasionally occur. If you have any problems or questions after discharge, please call your doctor. ACTIVITY You may resume your regular activity but move at a slower pace for the next 24 hours.  Take frequent rest periods for the next 24 hours.  Walking will help expel (get rid of) the air and reduce the bloated feeling in your abdomen.  No driving for 24 hours (because of the anesthesia (medicine) used during the test).  You may shower.  Do not sign any important legal documents or operate any machinery for 24 hours (because of the anesthesia used during the test).  NUTRITION Drink plenty of fluids.  You may resume your normal diet.  Begin with a light meal and progress to your normal diet.  Avoid alcoholic beverages for 24 hours or as instructed by your caregiver.  MEDICATIONS You may resume your normal medications unless your caregiver tells you otherwise.  WHAT YOU CAN EXPECT TODAY You may experience abdominal discomfort such as a feeling of fullness or "gas" pains.  FOLLOW-UP Your doctor will discuss the results of your test with you.  SEEK IMMEDIATE MEDICAL ATTENTION IF ANY OF THE FOLLOWING OCCUR: Excessive nausea (feeling sick to your stomach) and/or vomiting.  Severe abdominal pain and distention (swelling).  Trouble swallowing.  Temperature over 101 F (37.8 C).  Rectal bleeding or vomiting of blood.   Your EGD revealed mild amount inflammation in your stomach.  I took biopsies of this to rule out infection with a bacteria called H. pylori.  Await pathology results, my  office will contact you.  I also took samples of your esophagus.  Overall everything looks pretty good today.  Follow-up with Dr. Marletta Lor in 3 to 4 months.    I hope you have a great rest of your week!  Hennie Duos. Marletta Lor, D.O. Gastroenterology and Hepatology [facility]

## 2022-02-08 NOTE — Telephone Encounter (Signed)
Can we schedule patient for abdominal ultrasound? diagnosis abdominal pain, nausea vomiting.  Thank you

## 2022-02-10 ENCOUNTER — Telehealth: Payer: Self-pay

## 2022-02-10 ENCOUNTER — Other Ambulatory Visit: Payer: Self-pay | Admitting: Pharmacist

## 2022-02-10 ENCOUNTER — Telehealth: Payer: Self-pay | Admitting: Internal Medicine

## 2022-02-10 ENCOUNTER — Other Ambulatory Visit: Payer: Self-pay

## 2022-02-10 ENCOUNTER — Other Ambulatory Visit (HOSPITAL_COMMUNITY): Payer: Self-pay

## 2022-02-10 DIAGNOSIS — B20 Human immunodeficiency virus [HIV] disease: Secondary | ICD-10-CM

## 2022-02-10 LAB — SURGICAL PATHOLOGY

## 2022-02-10 MED ORDER — PANTOPRAZOLE SODIUM 40 MG PO TBEC: 40.0000 mg | DELAYED_RELEASE_TABLET | Freq: Every day | ORAL | 11 refills | Status: DC

## 2022-02-10 MED ORDER — RALTEGRAVIR POTASSIUM 600 MG PO TABS
1200.0000 mg | ORAL_TABLET | Freq: Every day | ORAL | 1 refills | Status: DC
  Filled 2022-02-10: qty 60, fill #0
  Filled 2022-02-10: qty 60, 30d supply, fill #0

## 2022-02-10 MED ORDER — EMTRICITABINE-TENOFOVIR DF 200-300 MG PO TABS
1.0000 | ORAL_TABLET | Freq: Every day | ORAL | 11 refills | Status: DC
  Filled 2022-02-10 (×2): qty 30, 30d supply, fill #0

## 2022-02-10 NOTE — Telephone Encounter (Signed)
Please let patient know gastric biopsies negative for H. pylori.  Esophageal biopsies did show evidence of acid reflux.  I will send in pantoprazole 40 mg daily to his pharmacy.  Proceed with ultrasound which is scheduled for next week.  Thank you

## 2022-02-10 NOTE — Progress Notes (Signed)
Received fax from [facility] stating insurance only covers Brand name Truvada. Will send prescription to [facility] to have medication filled. Patient is aware. Juanita Laster, RMA

## 2022-02-10 NOTE — Telephone Encounter (Signed)
Phoned and the pt's vm was full. 

## 2022-02-10 NOTE — Telephone Encounter (Signed)
[facility] Patient Advocate Encounter   Was successful in obtaining a  copay card for Isentress.  This copay card will make the patients copay $0.00.  I have spoken with the patient.    The billing information is as follows and has been shared with [facility].  RxBin: [id number] PCN: Loyalty Member ID: [id number] Group ID: [id number]  Clearance Coots, CPhT Specialty Pharmacy Patient Advocate [facility] for [facility] Phone: [phone] Fax:  [phone]

## 2022-02-11 ENCOUNTER — Other Ambulatory Visit (HOSPITAL_COMMUNITY): Payer: Self-pay

## 2022-02-11 NOTE — Telephone Encounter (Signed)
Phoned and pt's vm was full

## 2022-02-14 ENCOUNTER — Encounter (HOSPITAL_COMMUNITY): Payer: Self-pay | Admitting: Internal Medicine

## 2022-02-14 NOTE — Telephone Encounter (Signed)
Letter mailed to the pt to contact the office. 

## 2022-02-15 ENCOUNTER — Ambulatory Visit (HOSPITAL_COMMUNITY): Admission: RE | Admit: 2022-02-15 | Payer: BC Managed Care – PPO | Source: Ambulatory Visit

## 2022-02-25 ENCOUNTER — Ambulatory Visit (HOSPITAL_COMMUNITY)
Admission: RE | Admit: 2022-02-25 | Discharge: 2022-02-25 | Disposition: A | Discharge status: Home / Self Care | Payer: BC Managed Care – PPO | Source: Ambulatory Visit | Attending: Internal Medicine | Admitting: Internal Medicine

## 2022-02-25 DIAGNOSIS — R112 Nausea with vomiting, unspecified: Secondary | ICD-10-CM | POA: Insufficient documentation

## 2022-02-25 DIAGNOSIS — R109 Unspecified abdominal pain: Secondary | ICD-10-CM | POA: Diagnosis not present

## 2022-02-27 ENCOUNTER — Other Ambulatory Visit: Payer: Self-pay | Admitting: Family

## 2022-02-28 DIAGNOSIS — F4381 Prolonged grief disorder: Secondary | ICD-10-CM | POA: Diagnosis not present

## 2022-03-03 ENCOUNTER — Ambulatory Visit (INDEPENDENT_AMBULATORY_CARE_PROVIDER_SITE_OTHER): Payer: BC Managed Care – PPO | Admitting: Internal Medicine

## 2022-03-03 ENCOUNTER — Encounter: Payer: Self-pay | Admitting: Internal Medicine

## 2022-03-03 VITALS — BP 131/70 | HR 74 | Temp 97.7°F | Ht 70.0 in | Wt 170.2 lb

## 2022-03-03 DIAGNOSIS — K625 Hemorrhage of anus and rectum: Secondary | ICD-10-CM | POA: Diagnosis not present

## 2022-03-03 DIAGNOSIS — K602 Anal fissure, unspecified: Secondary | ICD-10-CM

## 2022-03-03 DIAGNOSIS — K219 Gastro-esophageal reflux disease without esophagitis: Secondary | ICD-10-CM | POA: Diagnosis not present

## 2022-03-03 DIAGNOSIS — R195 Other fecal abnormalities: Secondary | ICD-10-CM | POA: Diagnosis not present

## 2022-03-03 MED ORDER — PANTOPRAZOLE SODIUM 40 MG PO TBEC
40.0000 mg | DELAYED_RELEASE_TABLET | Freq: Every day | ORAL | 11 refills | Status: DC
Start: 2022-03-03 — End: 2022-03-11

## 2022-03-03 NOTE — Patient Instructions (Signed)
I am going to refer you to [facility] to further evaluate your anal fissures, questionable fistula.  Continue with your topical rectal cream.  If you need refills then let us know.  For your acid reflux I have sent in pantoprazole to [facility].  This is a once a day medication.  This medication works best if you take it at least 30 minutes before breakfast.  We can certainly set you up for hemorrhoid banding in the future if need be.  We will await what the surgeon say first and go from there.  Otherwise follow-up in 6 months.  Always a pleasure seeing you.  Dr. Marletta Lor

## 2022-03-03 NOTE — Progress Notes (Signed)
Primary Care Physician:  Gilmore Laroche, FNP Primary Gastroenterologist:  Dr. Marletta Lor  Chief Complaint  Patient presents with   Follow-up    Endoscopy results and fissures. Pt is doing better    HPI:   Gary Barrett is a 31 y.o. male who presents as a new patient for follow-up visit.  Recent diagnosis of HIV March 2023, follows with infectious disease.  States his HIV medications have caused severe nausea and occasional vomiting.  Has taken Zofran for this which helps his nausea but then makes him constipated.  Now on Phenergan which is helping.  States he has had rectal pain since October 2022.  Primarily with bowel movements, sometimes very severe.  Has been trialed on Proctofoam and hydrocortisone cream without relief.    I initially evaluated patient 12/23/2021.  Perianal exam revealed multiple anal fissures, too tender to perform digital rectal exam.  I started him on topical nitroglycerin/lidocaine.  His symptoms improved drastically.  No longer having pain.  Has stopped using his cream is much as he should however.  Continues to have intermittent rectal bleeding as well as mucus discharge.  Underwent EGD for his nausea which showed gastritis and evidence of acid reflux, gastric biopsies negative for H. pylori, esophageal biopsies showed evidence of acid reflux.  I started him on pantoprazole but states he has not picked this up.  Abdominal ultrasound completed 02/25/2022 which was largely unremarkable.  Past Medical History:  Diagnosis Date   HIV (human immunodeficiency virus infection) [facility])     Past Surgical History:  Procedure Laterality Date   BIOPSY  02/08/2022   Procedure: BIOPSY;  Surgeon: Lanelle Bal, DO;  [facility];  Service: Endoscopy;;   ESOPHAGOGASTRODUODENOSCOPY (EGD) WITH PROPOFOL N/A 02/08/2022   Procedure: ESOPHAGOGASTRODUODENOSCOPY (EGD) WITH PROPOFOL;  Surgeon: Lanelle Bal, DO;  Location[facility];  Service: Endoscopy;   Laterality: N/A;  1:00pm   INNER EAR SURGERY      Current Outpatient Medications  Medication Sig Dispense Refill   acetaminophen (TYLENOL) 325 MG tablet Take 650 mg by mouth every 6 (six) hours as needed for mild pain, moderate pain, fever or headache.     docusate sodium (COLACE) 100 MG capsule Take 100 mg by mouth 2 (two) times daily.     emtricitabine-tenofovir (TRUVADA) 200-300 MG tablet Take 1 tablet by mouth daily. 30 tablet 11   fluticasone (FLONASE) 50 MCG/ACT nasal spray Place 1 spray into both nostrils daily. 11.1 mL 2   ibuprofen (ADVIL) 600 MG tablet Take 1 tablet (600 mg total) by mouth every 6 (six) hours as needed. 30 tablet 0   promethazine (PHENERGAN) 12.5 MG tablet Take 1 tablet (12.5 mg total) by mouth every 6 (six) hours as needed for nausea or vomiting. 30 tablet 1   Raltegravir Potassium 600 MG TABS Take 1,200 mg by mouth daily. 60 tablet 1   dextromethorphan-guaiFENesin (MUCINEX DM) 30-600 MG 12hr tablet Take 1 tablet by mouth 2 (two) times daily as needed for cough (20). (Patient not taking: Reported on 03/03/2022) 10 tablet 0   pantoprazole (PROTONIX) 40 MG tablet Take 1 tablet (40 mg total) by mouth daily. 30 tablet 11   No current facility-administered medications for this visit.    Allergies as of 03/03/2022   (No Known Allergies)    Family History  Problem Relation Age of Onset   Heart disease Mother    CAD Mother    Colon cancer Maternal Grandmother    Sudden  death Cousin     Social History   Socioeconomic History   Marital status: Single    Spouse name: Not on file   Number of children: Not on file   Years of education: 16   Highest education level: Not on file  Occupational History   Not on file  Tobacco Use   Smoking status: Some Days    Packs/day: 0.10    Types: Cigarettes   Smokeless tobacco: Never  Vaping Use   Vaping Use: Never used  Substance and Sexual Activity   Alcohol use: Yes    Alcohol/week: 1.0 standard drink of alcohol     Types: 1 Glasses of wine per week    Comment: occ   Drug use: Yes    Types: Marijuana    Comment: occ   Sexual activity: Not on file    Comment: declined condoms  Other Topics Concern   Not on file  Social History Narrative   Not on file   Social Determinants of Health   Financial Resource Strain: Not on file  Food Insecurity: Not on file  Transportation Needs: Not on file  Physical Activity: Not on file  Stress: Not on file  Social Connections: Not on file  Intimate Partner Violence: Not on file    Subjective: Review of Systems  Constitutional:  Negative for chills and fever.  HENT:  Negative for congestion and hearing loss.   Eyes:  Negative for blurred vision and double vision.  Respiratory:  Negative for cough and shortness of breath.   Cardiovascular:  Negative for chest pain and palpitations.  Gastrointestinal:  Positive for blood in stool, constipation, nausea and vomiting. Negative for abdominal pain, diarrhea, heartburn and melena.       Rectal pain  Genitourinary:  Negative for dysuria and urgency.  Musculoskeletal:  Negative for joint pain and myalgias.  Skin:  Negative for itching and rash.  Neurological:  Negative for dizziness and headaches.  Psychiatric/Behavioral:  Negative for depression. The patient is not nervous/anxious.        Objective: BP 131/70   Pulse 74   Temp 97.7 F (36.5 C)   Ht 5\' 10"  (1.778 m)   Wt 170 lb 3.2 oz (77.2 kg)   BMI 24.42 kg/m  Physical Exam Constitutional:      Appearance: Normal appearance.  HENT:     Head: Normocephalic and atraumatic.  Eyes:     Extraocular Movements: Extraocular movements intact.     Conjunctiva/sclera: Conjunctivae normal.  Cardiovascular:     Rate and Rhythm: Normal rate and regular rhythm.  Pulmonary:     Effort: Pulmonary effort is normal.     Breath sounds: Normal breath sounds.  Abdominal:     General: Bowel sounds are normal.     Palpations: Abdomen is soft.  Musculoskeletal:         General: Normal range of motion.     Cervical back: Normal range of motion and neck supple.  Skin:    General: Skin is warm.  Neurological:     General: No focal deficit present.     Mental Status: He is alert and oriented to person, place, and time.  Psychiatric:        Mood and Affect: Mood normal.        Behavior: Behavior normal.    Assessment: *Multiple anal fissures, ?fistula *Rectal bleeding *Nausea and vomiting *Chronic GERD  Plan: Discussed anal fissures in depth with patient today.  Clinically improved on topical nitroglycerin/lidocaine,  will continue.  Given mucus, will refer to general surgery for evaluation. ?Fistula.  Appreciate their help with this patient.  Can consider hemorrhoid banding for internal hemorrhoids pending surgical evaluation.  Patient has not picked up his pantoprazole as he states he was unaware.  Will resend to pharmacy today for evidence of acid reflux on endoscopy.  Follow-up in 4 to 6 months.  03/03/2022 11:51 AM   Disclaimer: This note was dictated with voice recognition software. Similar sounding words can inadvertently be transcribed and may not be corrected upon review.

## 2022-03-04 ENCOUNTER — Other Ambulatory Visit: Payer: Self-pay | Admitting: *Deleted

## 2022-03-04 DIAGNOSIS — K602 Anal fissure, unspecified: Secondary | ICD-10-CM

## 2022-03-07 ENCOUNTER — Ambulatory Visit (INDEPENDENT_AMBULATORY_CARE_PROVIDER_SITE_OTHER): Payer: BC Managed Care – PPO | Admitting: Family

## 2022-03-07 ENCOUNTER — Encounter: Payer: Self-pay | Admitting: Family

## 2022-03-07 ENCOUNTER — Telehealth: Payer: Self-pay

## 2022-03-07 ENCOUNTER — Other Ambulatory Visit: Payer: Self-pay

## 2022-03-07 ENCOUNTER — Other Ambulatory Visit (HOSPITAL_COMMUNITY): Payer: Self-pay

## 2022-03-07 VITALS — BP 114/73 | HR 85 | Temp 98.6°F | Ht 69.0 in | Wt 171.0 lb

## 2022-03-07 DIAGNOSIS — B20 Human immunodeficiency virus [HIV] disease: Secondary | ICD-10-CM | POA: Diagnosis not present

## 2022-03-07 NOTE — Telephone Encounter (Signed)
[facility] Patient Advocate Encounter  Alison Stalling is paid under the patient medical benefits and no PA is required.  REF # [id number]  Patient will have a $40.00 office copay  Patient is enrolled in [employer] Claims Portal      Clearance Coots, CPhT Specialty Pharmacy Patient [facility] for Infectious Disease Phone: [phone] Fax:  [phone]   Clearance Coots , CPhT Specialty Pharmacy Patient [facility] for Infectious Disease Phone: [phone] Fax:  [phone]

## 2022-03-07 NOTE — Assessment & Plan Note (Signed)
Gary Barrett continues to have intolerance to multiple medications including Isentress and Truvada despite premedication with promethazine. I am concerned that he is not able to tolerate any of these medication and do not think control of his GERD would provide any significant improvement. In light of this I have discussed and recommended that we proceed with Cabenuva as alternative to oral medication at this time. Reviewed the risks, benefits, and potential side effects of medication. Pharmacy met with him regarding this as well. Will stop Isentress and Truvada and start approval process for Cabenuva. Check lab work today. Follow up pending approval of Cabenuva.

## 2022-03-07 NOTE — Progress Notes (Signed)
Brief Narrative   Patient ID: Gary Barrett, male    DOB: [birth year], 31 y.o.   MRN: [id number]  Mr. 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 Initial viral load was 40,500 with CD4 count of 199. Genotype with no significant medication resistant mutations. No history of opportunistic infection. [id number] negative. Entered care at [facility]. Had nausea with multiple regimens   Subjective:    Chief Complaint  Patient presents with   Follow-up    HPI:  Gary Barrett is a 31 y.o. male with HIV disease last seen on 02/02/22 with well controlled virus and poor tolerance to Delstrigo having increased nausea and vomiting similar to what he experienced with Dovato and Biktarvy. Changed to Isentress and Truvada. Viral load was undetectable and CD4 count 554. Was seen by Gastroenterology on 03/03/22 with healed rectal fissures and questionable fistula, chronic GERD, and nausea and vomiting. Previous EGD with gastritis and evidence of acid reflux and was started on pantoprazole. Here today for routine follow up.  [name] has been taking his Isentress and Truvada with continued nausea and vomiting when he takes the medication. Symptoms are improved when he does not take the medication. Has not started on pantoprazole yet and has been smoking marijuana which seems to help with his nausea more than the promethazine. Denies fevers, chills, night sweats, headaches, changes in vision, neck pain/stiffness, diarrhea, vomiting, lesions or rashes.   No Known Allergies    Outpatient Medications Prior to Visit  Medication Sig Dispense Refill   acetaminophen (TYLENOL) 325 MG tablet Take 650 mg by mouth every 6 (six) hours as needed for mild pain, moderate pain, fever or headache.     dextromethorphan-guaiFENesin (MUCINEX DM) 30-600 MG 12hr tablet Take 1 tablet by mouth 2 (two) times daily as needed for cough (20). 10 tablet 0   docusate sodium  (COLACE) 100 MG capsule Take 100 mg by mouth 2 (two) times daily.     fluticasone (FLONASE) 50 MCG/ACT nasal spray Place 1 spray into both nostrils daily. 11.1 mL 2   ibuprofen (ADVIL) 600 MG tablet Take 1 tablet (600 mg total) by mouth every 6 (six) hours as needed. 30 tablet 0   pantoprazole (PROTONIX) 40 MG tablet Take 1 tablet (40 mg total) by mouth daily. 30 tablet 11   promethazine (PHENERGAN) 12.5 MG tablet Take 1 tablet (12.5 mg total) by mouth every 6 (six) hours as needed for nausea or vomiting. 30 tablet 1   emtricitabine-tenofovir (TRUVADA) 200-300 MG tablet Take 1 tablet by mouth daily. 30 tablet 11   Raltegravir Potassium 600 MG TABS Take 1,200 mg by mouth daily. 60 tablet 1   No facility-administered medications prior to visit.     Past Medical History:  Diagnosis Date   HIV (human immunodeficiency virus infection) [facility])      Past Surgical History:  Procedure Laterality Date   BIOPSY  02/08/2022   Procedure: BIOPSY;  Surgeon: Eloise Harman, DO;  [facility];  Service: Endoscopy;;   ESOPHAGOGASTRODUODENOSCOPY (EGD) WITH PROPOFOL N/A 02/08/2022   Procedure: ESOPHAGOGASTRODUODENOSCOPY (EGD) WITH PROPOFOL;  Surgeon: Eloise Harman, DO;  [facility];  Service: Endoscopy;  Laterality: N/A;  1:00pm   INNER EAR SURGERY        Review of Systems  Constitutional:  Negative for appetite change, chills, fatigue, fever and unexpected weight change.  Eyes:  Negative for visual disturbance.  Respiratory:  Negative for cough, chest tightness, shortness of breath and wheezing.   Cardiovascular:  Negative for chest pain and leg swelling.  Gastrointestinal:  Positive for nausea. Negative for abdominal pain, constipation, diarrhea and vomiting.  Genitourinary:  Negative for dysuria, flank pain, frequency, genital sores, hematuria and urgency.  Skin:  Negative for rash.  Allergic/Immunologic: Negative for immunocompromised state.  Neurological:  Negative  for dizziness and headaches.      Objective:    BP 114/73   Pulse 85   Temp 98.6 F (37 C) (Oral)   Ht 5' 9"  (1.753 m)   Wt 171 lb (77.6 kg)   SpO2 97%   BMI 25.25 kg/m  Nursing note and vital signs reviewed.  Physical Exam Constitutional:      General: He is not in acute distress.    Appearance: He is well-developed.  Eyes:     Conjunctiva/sclera: Conjunctivae normal.  Cardiovascular:     Rate and Rhythm: Normal rate and regular rhythm.     Heart sounds: Normal heart sounds. No murmur heard.    No friction rub. No gallop.  Pulmonary:     Effort: Pulmonary effort is normal. No respiratory distress.     Breath sounds: Normal breath sounds. No wheezing or rales.  Chest:     Chest wall: No tenderness.  Abdominal:     General: Bowel sounds are normal.     Palpations: Abdomen is soft.     Tenderness: There is no abdominal tenderness.  Musculoskeletal:     Cervical back: Neck supple.  Lymphadenopathy:     Cervical: No cervical adenopathy.  Skin:    General: Skin is warm and dry.     Findings: No rash.  Neurological:     Mental Status: He is alert and oriented to person, place, and time.  Psychiatric:        Behavior: Behavior normal.        Thought Content: Thought content normal.        Judgment: Judgment normal.         03/07/2022    2:04 PM 02/01/2022   10:16 AM 12/31/2021   10:12 AM 12/27/2021    8:04 AM  Depression screen PHQ 2/9  Decreased Interest 0 0 0 0  Down, Depressed, Hopeless 0 0 0 0  PHQ - 2 Score 0 0 0 0       Assessment & Plan:    Patient Active Problem List   Diagnosis Date Noted   Elevated BP without diagnosis of hypertension 12/28/2021   Constipation 12/07/2021   Hemorrhoids 11/17/2021   Healthcare maintenance 11/17/2021   HIV disease ([place]) 10/21/2021   Encounter for screening for HIV    High risk homosexual behavior    Syncope 05-Nov-2021   Diaphoresis 11-05-2021   FH: sudden cardiac death (SCD)    FH: premature coronary heart  disease      Problem List Items Addressed This Visit       Other   HIV disease ([place]) - Primary    Mr. Kenneth continues to have intolerance to multiple medications including Isentress and Truvada despite premedication with promethazine. I am concerned that he is not able to tolerate any of these medication and do not think control of his GERD would provide any significant improvement. In light of this I have discussed and recommended that we proceed with Cabenuva as alternative to oral medication at this time. Reviewed the risks, benefits, and potential side effects of medication. Pharmacy met  with him regarding this as well. Will stop Isentress and Truvada and start approval process for Cabenuva. Check lab work today. Follow up pending approval of Cabenuva.       Relevant Orders   HIV-1 RNA quant-no reflex-bld   T-helper cell (CD4)- ([facility] clinic only)     I have discontinued [name]. Fake's emtricitabine-tenofovir and Raltegravir Potassium. I am also having him maintain his promethazine, docusate sodium, acetaminophen, ibuprofen, dextromethorphan-guaiFENesin, fluticasone, and pantoprazole.   Follow-up: Follow up in 1 month or sooner if needed pending Cabenuva approval.    Terri Piedra, MSN, FNP-C Nurse Practitioner [facility] for [place] number: [phone]

## 2022-03-07 NOTE — Patient Instructions (Signed)
Nice to see you.   We will get you started on new medication.  Check lab work today.  Follow up pending approval and arrival of medication.

## 2022-03-07 NOTE — Progress Notes (Signed)
Discussed Cabenuva with patient including process, expectations, and need for close follow up. Will proceed with PA process and contact him and Tammy Sours when we get a decision.  Christyanna Mckeon L. Xavion Muscat, PharmD [facility] Clinical Pharmacist Practitioner

## 2022-03-08 ENCOUNTER — Telehealth: Payer: Self-pay

## 2022-03-08 LAB — T-HELPER CELL (CD4) - (RCID CLINIC ONLY)
CD4 % Helper T Cell: 32 % — ABNORMAL LOW (ref 33–65)
CD4 T Cell Abs: 546 /uL (ref 400–1790)

## 2022-03-08 NOTE — Telephone Encounter (Signed)
Insurance will not cover pantoprazole, omeprazole must be tried and failed first. Please advise.

## 2022-03-10 ENCOUNTER — Encounter: Payer: Self-pay | Admitting: Pharmacist

## 2022-03-10 LAB — HIV-1 RNA QUANT-NO REFLEX-BLD
HIV 1 RNA Quant: 26 Copies/mL — ABNORMAL HIGH
HIV-1 RNA Quant, Log: 1.41 Log cps/mL — ABNORMAL HIGH

## 2022-03-11 ENCOUNTER — Telehealth: Payer: Self-pay | Admitting: Pharmacist

## 2022-03-11 ENCOUNTER — Other Ambulatory Visit: Payer: Self-pay | Admitting: Internal Medicine

## 2022-03-11 MED ORDER — OMEPRAZOLE 40 MG PO CPDR: 40.0000 mg | DELAYED_RELEASE_CAPSULE | Freq: Every day | ORAL | 3 refills | Status: DC

## 2022-03-11 NOTE — Telephone Encounter (Signed)
Patient called clinic to review Cabenuva counseling points and schedule first appointment. Scheduled to see me on Tuesday afternoon for first shots.  Counseled that [place] is two separate intramuscular injections in the gluteal muscle on each side for each visit. Explained that the second injection is 30 days after the initial injection then every 2 months thereafter. Discussed the need for viral load monitoring every 2 months for the first 6 months and then periodically afterwards as their provider sees the need. Discussed the rare but significant chance of developing resistance despite compliance. Explained that showing up to injection appointments is very important and warned that if 2 appointments are missed, it will be reassessed by their provider whether they are a good candidate for injection therapy. Counseled on possible side effects associated with the injections such as injection site pain, which is usually mild to moderate in nature, injection site nodules, and injection site reactions. Asked to call the clinic or send me a mychart message if they experience any issues, such as fatigue, nausea, headache, rash, or dizziness. Advised that they can take ibuprofen or tylenol for injection site pain if needed. He is nervous about the side effects but reviewed additional ways to mitigate such as remaining active the day of the injections or using cold/hot compresses as needed.   Margarite Gouge, PharmD, CPP, BCIDP Clinical Pharmacist Practitioner Infectious Diseases Clinical Pharmacist [facility] for Infectious Disease

## 2022-03-11 NOTE — Telephone Encounter (Signed)
Omeprazole sent to pharmacy.

## 2022-03-14 NOTE — Telephone Encounter (Signed)
Pt was made aware and verbalized understanding.  

## 2022-03-15 ENCOUNTER — Encounter: Payer: BC Managed Care – PPO | Admitting: Pharmacist

## 2022-03-16 ENCOUNTER — Other Ambulatory Visit: Payer: Self-pay

## 2022-03-16 ENCOUNTER — Ambulatory Visit (INDEPENDENT_AMBULATORY_CARE_PROVIDER_SITE_OTHER): Payer: BC Managed Care – PPO | Admitting: Pharmacist

## 2022-03-16 DIAGNOSIS — B20 Human immunodeficiency virus [HIV] disease: Secondary | ICD-10-CM

## 2022-03-16 MED ORDER — CABOTEGRAVIR & RILPIVIRINE ER 600 & 900 MG/3ML IM SUER: 1.0000 | INTRAMUSCULAR | 1 refills | Status: DC

## 2022-03-16 MED ORDER — CABOTEGRAVIR & RILPIVIRINE ER 600 & 900 MG/3ML IM SUER
1.0000 | Freq: Once | INTRAMUSCULAR | Status: AC
  Administered 2022-03-16: 1 via INTRAMUSCULAR

## 2022-03-16 NOTE — Progress Notes (Signed)
HPI: Gary Barrett is a 31 y.o. male who presents to the [facility] for [place] administration.  Patient Active Problem List   Diagnosis Date Noted   Elevated BP without diagnosis of hypertension 12/28/2021   Constipation 12/07/2021   Hemorrhoids 11/17/2021   Healthcare maintenance 11/17/2021   HIV disease ([place]) 10/21/2021   Encounter for screening for HIV    High risk homosexual behavior    Syncope 2021/11/11   Diaphoresis 2021/11/11   FH: sudden cardiac death (SCD)    FH: premature coronary heart disease     Patient's Medications  New Prescriptions   CABOTEGRAVIR & RILPIVIRINE ER (CABENUVA) 600 & 900 MG/3ML INJECTION    Inject 1 kit into the muscle every 30 (thirty) days.  Previous Medications   ACETAMINOPHEN (TYLENOL) 325 MG TABLET    Take 650 mg by mouth every 6 (six) hours as needed for mild pain, moderate pain, fever or headache.   DEXTROMETHORPHAN-GUAIFENESIN (MUCINEX DM) 30-600 MG 12HR TABLET    Take 1 tablet by mouth 2 (two) times daily as needed for cough (20).   DOCUSATE SODIUM (COLACE) 100 MG CAPSULE    Take 100 mg by mouth 2 (two) times daily.   FLUTICASONE (FLONASE) 50 MCG/ACT NASAL SPRAY    Place 1 spray into both nostrils daily.   IBUPROFEN (ADVIL) 600 MG TABLET    Take 1 tablet (600 mg total) by mouth every 6 (six) hours as needed.   OMEPRAZOLE (PRILOSEC) 40 MG CAPSULE    Take 1 capsule (40 mg total) by mouth daily.   PROMETHAZINE (PHENERGAN) 12.5 MG TABLET    Take 1 tablet (12.5 mg total) by mouth every 6 (six) hours as needed for nausea or vomiting.  Modified Medications   No medications on file  Discontinued Medications   No medications on file    Allergies: No Known Allergies  Past Medical History: Past Medical History:  Diagnosis Date   HIV (human immunodeficiency virus infection) ([place])     Social History: Social History   Socioeconomic History   Marital status: Single    Spouse name: Not on file   Number of children: Not on  file   Years of education: 16   Highest education level: Not on file  Occupational History   Not on file  Tobacco Use   Smoking status: Some Days    Packs/day: 0.10    Types: Cigarettes   Smokeless tobacco: Never   Tobacco comments:    Smokes about 1 pack per week, cutting back  Vaping Use   Vaping Use: Never used  Substance and Sexual Activity   Alcohol use: Yes    Alcohol/week: 1.0 standard drink of alcohol    Types: 1 Glasses of wine per week    Comment: occasional   Drug use: Yes    Types: Marijuana    Comment: daily - helps with nausea   Sexual activity: Not on file    Comment: declined condoms  Other Topics Concern   Not on file  Social History Narrative   Not on file   Social Determinants of Health   Financial Resource Strain: Not on file  Food Insecurity: Not on file  Transportation Needs: Not on file  Physical Activity: Not on file  Stress: Not on file  Social Connections: Not on file    Labs: Lab Results  Component Value Date   HIV1RNAQUANT 26 (H) 03/07/2022   HIV1RNAQUANT Not Detected 02/02/2022   HIV1RNAQUANT Not Detected 12/31/2021  CD4TABS 546 03/07/2022   CD4TABS 554 02/02/2022   CD4TABS 199 (L) 10/21/2021    RPR and STI Lab Results  Component Value Date   LABRPR REACTIVE (A) 10/21/2021   RPRTITER 1:2,048 (H) 10/21/2021        No data to display          Hepatitis B Lab Results  Component Value Date   HEPBSAB REACTIVE (A) 10/21/2021   HEPBSAG NON-REACTIVE 10/21/2021   HEPBCAB NON-REACTIVE 10/21/2021   Hepatitis C Lab Results  Component Value Date   HEPCAB NON-REACTIVE 10/21/2021   Hepatitis A Lab Results  Component Value Date   HAV REACTIVE (A) 10/21/2021   Lipids: Lab Results  Component Value Date   CHOL 167 10/18/2021   TRIG 127 10/18/2021   HDL 21 (L) 10/18/2021   CHOLHDL 8.0 10/18/2021   VLDL 25 10/18/2021   LDLCALC 121 (H) 10/18/2021    Current HIV Regimen: Isentress + Truvada  TARGET DATE: The 23rd of  the month  Assessment: Edel presents today for their first initiation injection for Cabenuva. Counseled that [place] is two separate intramuscular injections in the gluteal muscle on each side for each visit. Explained that the second injection is 30 days after the initial injection then every 2 months thereafter. Discussed the need for viral load monitoring every 2 months for the first 6 months and then periodically afterwards as their provider sees the need. Discussed the rare but significant chance of developing resistance despite compliance. Explained that showing up to injection appointments is very important and warned that if 2 appointments are missed, it will be reassessed by their provider whether they are a good candidate for injection therapy. Counseled on possible side effects associated with the injections such as injection site pain, which is usually mild to moderate in nature, injection site nodules, and injection site reactions. Asked to call the clinic or send me a mychart message if they experience any issues, such as fatigue, nausea, headache, rash, or dizziness. Advised that they can take ibuprofen or tylenol for injection site pain if needed.   Administered cabotegravir 640m/3mL in left upper outer quadrant of the gluteal muscle. Administered rilpivirine 900 mg/359min the right upper outer quadrant of the gluteal muscle. Monitored patient for 10 minutes after injection. Injections were tolerated well without issue. Counseled to stop taking his combination regimen after today's dose and to call with any issues that may arise. Will make follow up appointments for second initiation injection in 30 days and then maintenance injections every 2 months thereafter for 6 months.   Plan: - Stop Isentress + Truvada - First Cabenuva injections administered - Second initiation injection scheduled for 9/20 with me  - Maintenance injections scheduled for 11/27 with GrMarya Amsler- Call with any issues  or questions  AmAlfonse SprucePharmD, CPP, BCOgden Duneslinical Pharmacist Practitioner [place] Infectious [facility]

## 2022-03-17 ENCOUNTER — Ambulatory Visit (INDEPENDENT_AMBULATORY_CARE_PROVIDER_SITE_OTHER): Payer: BC Managed Care – PPO | Admitting: General Surgery

## 2022-03-17 ENCOUNTER — Encounter: Payer: Self-pay | Admitting: General Surgery

## 2022-03-17 VITALS — BP 116/72 | HR 76 | Temp 98.8°F | Resp 16 | Ht 69.0 in | Wt 171.0 lb

## 2022-03-17 DIAGNOSIS — K603 Anal fistula: Secondary | ICD-10-CM | POA: Diagnosis not present

## 2022-03-17 DIAGNOSIS — K602 Anal fissure, unspecified: Secondary | ICD-10-CM

## 2022-03-17 NOTE — Progress Notes (Signed)
Gary Barrett; [id number]; 1990-12-19   HPI Patient is a 31 year old white male who was referred to my care by Dr. Abbey Chatters of gastroenterology for evaluation and treatment of an anal fissure and possible anal fistula.  Patient states that his rectal issues started approximately 10 months ago.  He has had intermittent rectal pain with spots of blood and cloudy mucousy drainage around the anus.  He has had some problems keeping himself clean after bowel movements and does soil his underwear.  He does not have frank incontinence.  He denies passing air through any opening outside the anus.  He was recently seen by Dr. Abbey Chatters and started on a compounded rectal cream.  He states that this has made the pain and irritation much less.  This is the first time he was treated this way.  He did undergo an EGD.  He is HIV positive and was recently placed on treatment for prevention.  He states his T4 levels are nondetectable.  He has not had a colonoscopy.  His mother was diagnosed with colon cancer in her 62s and recently died from an MI. Past Medical History:  Diagnosis Date   HIV (human immunodeficiency virus infection) [facility])     Past Surgical History:  Procedure Laterality Date   BIOPSY  02/08/2022   Procedure: BIOPSY;  Surgeon: Eloise Harman, DO;  Location[facility];  Service: Endoscopy;;   ESOPHAGOGASTRODUODENOSCOPY (EGD) WITH PROPOFOL N/A 02/08/2022   Procedure: ESOPHAGOGASTRODUODENOSCOPY (EGD) WITH PROPOFOL;  Surgeon: Eloise Harman, DO;  Location: [facility];  Service: Endoscopy;  Laterality: N/A;  1:00pm   INNER EAR SURGERY      Family History  Problem Relation Age of Onset   Heart disease Mother    CAD Mother    Colon cancer Maternal Grandmother    Sudden death Cousin     Current Outpatient Medications on File Prior to Visit  Medication Sig Dispense Refill   acetaminophen (TYLENOL) 325 MG tablet Take 650 mg by mouth every 6 (six) hours as needed for mild pain, moderate  pain, fever or headache.     cabotegravir & rilpivirine ER (CABENUVA) 600 & 900 MG/3ML injection Inject 1 kit into the muscle every 30 (thirty) days. 6 mL 1   dextromethorphan-guaiFENesin (MUCINEX DM) 30-600 MG 12hr tablet Take 1 tablet by mouth 2 (two) times daily as needed for cough (20). 10 tablet 0   docusate sodium (COLACE) 100 MG capsule Take 100 mg by mouth 2 (two) times daily.     fluticasone (FLONASE) 50 MCG/ACT nasal spray Place 1 spray into both nostrils daily. 11.1 mL 2   ibuprofen (ADVIL) 600 MG tablet Take 1 tablet (600 mg total) by mouth every 6 (six) hours as needed. 30 tablet 0   NONFORMULARY OR COMPOUNDED ITEM nitroglycerin/lidocaine rectal ointment     omeprazole (PRILOSEC) 40 MG capsule Take 1 capsule (40 mg total) by mouth daily. 90 capsule 3   promethazine (PHENERGAN) 12.5 MG tablet Take 1 tablet (12.5 mg total) by mouth every 6 (six) hours as needed for nausea or vomiting. 30 tablet 1   No current facility-administered medications on file prior to visit.    No Known Allergies  Social History   Substance and Sexual Activity  Alcohol Use Yes   Alcohol/week: 1.0 standard drink of alcohol   Types: 1 Glasses of wine per week   Comment: occasional    Social History   Tobacco Use  Smoking Status Some Days   Packs/day: 0.10  Types: Cigarettes   Passive exposure: Never  Smokeless Tobacco Never  Tobacco Comments   Smokes about 1 pack per week, cutting back    Review of Systems  Constitutional:  Positive for malaise/fatigue.  HENT: Negative.    Eyes:  Positive for blurred vision.  Respiratory: Negative.    Cardiovascular: Negative.   Gastrointestinal: Negative.   Genitourinary: Negative.   Musculoskeletal: Negative.   Skin: Negative.   Neurological: Negative.   Endo/Heme/Allergies: Negative.   Psychiatric/Behavioral: Negative.      Objective   Vitals:   03/17/22 1130  BP: 116/72  Pulse: 76  Resp: 16  Temp: 98.8 F (37.1 C)  SpO2: 98%     Physical Exam Vitals reviewed.  Constitutional:      Appearance: Normal appearance. He is normal weight. He is not ill-appearing.  HENT:     Head: Normocephalic and atraumatic.  Cardiovascular:     Rate and Rhythm: Normal rate and regular rhythm.     Heart sounds: Normal heart sounds. No murmur heard.    No friction rub. No gallop.  Pulmonary:     Effort: Pulmonary effort is normal. No respiratory distress.     Breath sounds: Normal breath sounds. No stridor. No wheezing, rhonchi or rales.  Abdominal:     General: Abdomen is flat. Bowel sounds are normal. There is no distension.     Palpations: Abdomen is soft. There is no mass.     Tenderness: There is no abdominal tenderness. There is no guarding or rebound.  Genitourinary:    Comments: External rectal examination reveals a fistulous opening at the 5 o'clock position approximately 1-1/2 to 2 cm outside the anal verge.  There is a subcutaneous indurated track that extends up to the dentate line.  There is also evidence of a possible healing anal fissure as well as indurated tissue along the region.  Rectal examination was limited secondary to pain.  There is no active bleeding.  I could not express any purulent fluid.  Several very small condyloma are present in the perianal region. Skin:    General: Skin is warm and dry.  Neurological:     Mental Status: He is alert and oriented to person, place, and time.     Assessment  Anal fissure with fistula.  Immediate family history of colon cancer. I told the patient that I was concerned about this fistula possibly affecting his external sphincter mechanism.  There is also the possibility of inflammatory bowel disease as he has not had a colonoscopy.  Given the extensiveness of his disease, I told him I felt that referral to colorectal surgery would be appropriate as this may be outside my realm of expertise.  He understands. Plan  Referral has been made to Dr. Leighton Ruff in  [place].  Further management is pending that referral.

## 2022-03-18 ENCOUNTER — Other Ambulatory Visit: Payer: Self-pay | Admitting: *Deleted

## 2022-03-18 DIAGNOSIS — K602 Anal fissure, unspecified: Secondary | ICD-10-CM

## 2022-03-22 ENCOUNTER — Other Ambulatory Visit (HOSPITAL_COMMUNITY): Payer: Self-pay

## 2022-03-24 ENCOUNTER — Other Ambulatory Visit (HOSPITAL_COMMUNITY): Payer: Self-pay

## 2022-03-24 ENCOUNTER — Telehealth: Payer: Self-pay

## 2022-03-24 NOTE — Telephone Encounter (Signed)
Refill request for promethazine 12.5 mg tablets qty: 30 to be sent to [facility]

## 2022-03-29 ENCOUNTER — Other Ambulatory Visit (HOSPITAL_COMMUNITY): Payer: Self-pay

## 2022-03-30 ENCOUNTER — Other Ambulatory Visit: Payer: Self-pay | Admitting: Internal Medicine

## 2022-03-30 MED ORDER — PROMETHAZINE HCL 12.5 MG PO TABS
12.5000 mg | ORAL_TABLET | Freq: Four times a day (QID) | ORAL | 1 refills | Status: DC | PRN
Start: 2022-03-30 — End: 2022-06-20

## 2022-03-30 NOTE — Telephone Encounter (Signed)
Sent to pharmacy 

## 2022-04-05 ENCOUNTER — Encounter: Payer: Self-pay | Admitting: Family Medicine

## 2022-04-05 ENCOUNTER — Ambulatory Visit: Payer: BC Managed Care – PPO | Admitting: Family Medicine

## 2022-04-13 ENCOUNTER — Ambulatory Visit (INDEPENDENT_AMBULATORY_CARE_PROVIDER_SITE_OTHER): Payer: BC Managed Care – PPO | Admitting: Pharmacist

## 2022-04-13 ENCOUNTER — Other Ambulatory Visit: Payer: Self-pay

## 2022-04-13 DIAGNOSIS — B20 Human immunodeficiency virus [HIV] disease: Secondary | ICD-10-CM

## 2022-04-13 MED ORDER — CABOTEGRAVIR & RILPIVIRINE ER 600 & 900 MG/3ML IM SUER
1.0000 | Freq: Once | INTRAMUSCULAR | Status: AC
  Administered 2022-04-13: 1 via INTRAMUSCULAR

## 2022-04-13 NOTE — Progress Notes (Signed)
HPI: Gary Barrett is a 31 y.o. male who presents to the [facility] for [place] administration.  Patient Active Problem List   Diagnosis Date Noted   Elevated BP without diagnosis of hypertension 12/28/2021   Constipation 12/07/2021   Hemorrhoids 11/17/2021   Healthcare maintenance 11/17/2021   HIV disease ([place]) 10/21/2021   Encounter for screening for HIV    High risk homosexual behavior    Syncope 10/24/2021   Diaphoresis 10/24/2021   FH: sudden cardiac death (SCD)    FH: premature coronary heart disease     Patient's Medications  New Prescriptions   No medications on file  Previous Medications   ACETAMINOPHEN (TYLENOL) 325 MG TABLET    Take 650 mg by mouth every 6 (six) hours as needed for mild pain, moderate pain, fever or headache.   CABOTEGRAVIR & RILPIVIRINE ER (CABENUVA) 600 & 900 MG/3ML INJECTION    Inject 1 kit into the muscle every 30 (thirty) days.   DEXTROMETHORPHAN-GUAIFENESIN (MUCINEX DM) 30-600 MG 12HR TABLET    Take 1 tablet by mouth 2 (two) times daily as needed for cough (20).   DOCUSATE SODIUM (COLACE) 100 MG CAPSULE    Take 100 mg by mouth 2 (two) times daily.   FLUTICASONE (FLONASE) 50 MCG/ACT NASAL SPRAY    Place 1 spray into both nostrils daily.   IBUPROFEN (ADVIL) 600 MG TABLET    Take 1 tablet (600 mg total) by mouth every 6 (six) hours as needed.   NONFORMULARY OR COMPOUNDED ITEM    nitroglycerin/lidocaine rectal ointment   OMEPRAZOLE (PRILOSEC) 40 MG CAPSULE    Take 1 capsule (40 mg total) by mouth daily.   PROMETHAZINE (PHENERGAN) 12.5 MG TABLET    Take 1 tablet (12.5 mg total) by mouth every 6 (six) hours as needed for nausea or vomiting.  Modified Medications   No medications on file  Discontinued Medications   No medications on file    Allergies: No Known Allergies  Past Medical History: Past Medical History:  Diagnosis Date   HIV (human immunodeficiency virus infection) ([place])     Social History: Social History    Socioeconomic History   Marital status: Single    Spouse name: Not on file   Number of children: Not on file   Years of education: 16   Highest education level: Not on file  Occupational History   Not on file  Tobacco Use   Smoking status: Some Days    Packs/day: 0.10    Types: Cigarettes    Passive exposure: Never   Smokeless tobacco: Never   Tobacco comments:    Smokes about 1 pack per week, cutting back  Vaping Use   Vaping Use: Never used  Substance and Sexual Activity   Alcohol use: Yes    Alcohol/week: 1.0 standard drink of alcohol    Types: 1 Glasses of wine per week    Comment: occasional   Drug use: Yes    Types: Marijuana    Comment: daily - helps with nausea   Sexual activity: Yes    Comment: declined condoms  Other Topics Concern   Not on file  Social History Narrative   Not on file   Social Determinants of Health   Financial Resource Strain: Not on file  Food Insecurity: Not on file  Transportation Needs: Not on file  Physical Activity: Not on file  Stress: Not on file  Social Connections: Not on file    Labs: Lab Results  Component Value Date   HIV1RNAQUANT 26 (H) 03/07/2022   HIV1RNAQUANT Not Detected 02/02/2022   HIV1RNAQUANT Not Detected 12/31/2021   CD4TABS 546 03/07/2022   CD4TABS 554 02/02/2022   CD4TABS 199 (L) 10/21/2021    RPR and STI Lab Results  Component Value Date   LABRPR REACTIVE (A) 10/21/2021   RPRTITER 1:2,048 (H) 10/21/2021        No data to display          Hepatitis B Lab Results  Component Value Date   HEPBSAB REACTIVE (A) 10/21/2021   HEPBSAG NON-REACTIVE 10/21/2021   HEPBCAB NON-REACTIVE 10/21/2021   Hepatitis C Lab Results  Component Value Date   HEPCAB NON-REACTIVE 10/21/2021   Hepatitis A Lab Results  Component Value Date   HAV REACTIVE (A) 10/21/2021   Lipids: Lab Results  Component Value Date   CHOL 167 10/18/2021   TRIG 127 10/18/2021   HDL 21 (L) 10/18/2021   CHOLHDL 8.0  10/18/2021   VLDL 25 10/18/2021   LDLCALC 121 (H) 10/18/2021    TARGET DATE:  The 23rd of the month  Current HIV Regimen: Cabenuva  Assessment: Abhijot presents today for their maintenance Cabenuva injections. Initial/past injections were tolerated well without issues. No problems with systemic effects of injections. Patient did experience soreness on the right side for ~2 weeks but states that premeditating and working in his yard the same day as injections helped last time. Reviewed that symptoms tend to improve over time. He states he is very thankful he has not experienced any further nausea or issues as with the oral regimens he trialed.  Administered cabotegravir 681m/3mL in left upper outer quadrant of the gluteal muscle. Administered rilpivirine 900 mg/371min the right upper outer quadrant of the gluteal muscle. Monitored patient for 10 minutes after injection. Injections were tolerated well without issue. Patient will follow up in 2 months for next injection. Will check HIV RNA today. Patient has not been sexually active, so will defer STI testing today.   Plan: - Cabenuva injections administered - Check HIV RNA - Next injections scheduled for 11/27 with GrMarya Amslernd 1/17 with me  - Call with any issues or questions  AmAlfonse SprucePharmD, CPP, BCSpringhilllinical Pharmacist Practitioner [place] [facility]

## 2022-04-15 ENCOUNTER — Ambulatory Visit (INDEPENDENT_AMBULATORY_CARE_PROVIDER_SITE_OTHER): Payer: BC Managed Care – PPO | Admitting: Family Medicine

## 2022-04-15 ENCOUNTER — Encounter: Payer: Self-pay | Admitting: Family Medicine

## 2022-04-15 VITALS — BP 134/74 | HR 98 | Ht 69.5 in | Wt 174.0 lb

## 2022-04-15 DIAGNOSIS — K603 Anal fistula: Secondary | ICD-10-CM | POA: Diagnosis not present

## 2022-04-15 DIAGNOSIS — B20 Human immunodeficiency virus [HIV] disease: Secondary | ICD-10-CM

## 2022-04-15 DIAGNOSIS — Z23 Encounter for immunization: Secondary | ICD-10-CM | POA: Insufficient documentation

## 2022-04-15 DIAGNOSIS — K602 Anal fissure, unspecified: Secondary | ICD-10-CM | POA: Diagnosis not present

## 2022-04-15 DIAGNOSIS — K5909 Other constipation: Secondary | ICD-10-CM

## 2022-04-15 NOTE — Assessment & Plan Note (Signed)
He was intolerant to multiple antiviral medications, including Isentress and Truvada, despite premedication with promethazine His oral antiviral medication was changed to a once-monthly injectable medication called Kern Reap He had his second injection on 04/13/22 and reports doing well with the new medication His next administration is on 06/20/22

## 2022-04-15 NOTE — Assessment & Plan Note (Signed)
Immediate family history of colon cancer He follows up with GI, Dr.Carver, who  referred him to general surgery, Dr. Aviva Signs, to evaluate and treat anal fissures and possible anal fistula Dr. Aviva Signs, general surgery, was concerned that the fistula could possibly be affecting his external sphincter mechanism with the possibility of inflammatory bowel disease Therefore,  a referral was placed to colorectal surgery, with an appointment set for 04/19/22.

## 2022-04-15 NOTE — Patient Instructions (Signed)
I appreciate the opportunity to provide care to you today!    Follow up:  3 months  Labs:  Next visit  Thank You for getting your flu vaccine today :)  Please continue to a heart-healthy diet and increase your physical activities. Try to exercise for 39mins at least three times a week.      It was a pleasure to see you and I look forward to continuing to work together on your health and well-being. Please do not hesitate to call the office if you need care or have questions about your care.   Have a wonderful day and week. With Gratitude, Alvira Monday MSN, FNP-BC

## 2022-04-15 NOTE — Assessment & Plan Note (Signed)
Patient educated on CDC recommendation for the vaccine. Verbal consent was obtained from the patient, vaccine administered by nurse, no sign of adverse reactions noted at this time. Patient education on arm soreness and use of tylenol or ibuprofen for this patient  was discussed. Patient educated on the signs and symptoms of adverse effect and advise to contact the office if they occur.  

## 2022-04-15 NOTE — Progress Notes (Signed)
Established Patient Office Visit  Subjective:  Patient ID: Gary Barrett, male    DOB: 08/05/1990  Age: 31 y.o. MRN: [id number]  CC:  Chief Complaint  Patient presents with   Follow-up    Following up, will be getting flu today.    HPI Gary Barrett is a 31 y.o. male with past medical history of HIV and Constipation presents for f/u of  chronic medical conditions.  Constipation: takes Colace 100 mg as needed to relieve constipation.  HIV: he was intolerant to multiple antiviral medications, including Isentress and Truvada, despite premedication with promethazine. His oral antiviral medication was changed to a once-monthly injectable medication called Cabenuva. He had his second injection on 04/13/22 and reports doing well with the new medication. His next administration is on 06/20/22.   Anal fissure and fistula: Immediate family history of colon cancer. He follows up with GI, Dr.Carver, who  referred him to general surgery, Dr. Aviva Signs, to evaluate and treat anal fissures and possible anal fistula. Dr. Aviva Signs, general surgery, was concerned that the fistula could possibly be affecting his external sphincter mechanism with the possibility of inflammatory bowel disease.Therefore, a referral was placed to colorectal surgery, with an appointment set for 04/19/22.  Past Medical History:  Diagnosis Date   HIV (human immunodeficiency virus infection) [facility])     Past Surgical History:  Procedure Laterality Date   BIOPSY  02/08/2022   Procedure: BIOPSY;  Surgeon: Eloise Harman, DO;  [facility];  Service: Endoscopy;;   ESOPHAGOGASTRODUODENOSCOPY (EGD) WITH PROPOFOL N/A 02/08/2022   Procedure: ESOPHAGOGASTRODUODENOSCOPY (EGD) WITH PROPOFOL;  Surgeon: Eloise Harman, DO;  Location[facility];  Service: Endoscopy;  Laterality: N/A;  1:00pm   INNER EAR SURGERY      Family History  Problem Relation Age of Onset   Heart disease Mother    CAD Mother     Colon cancer Maternal Grandmother    Sudden death Cousin     Social History   Socioeconomic History   Marital status: Single    Spouse name: Not on file   Number of children: Not on file   Years of education: 16   Highest education level: Not on file  Occupational History   Not on file  Tobacco Use   Smoking status: Some Days    Packs/day: 0.10    Types: Cigarettes    Passive exposure: Never   Smokeless tobacco: Never   Tobacco comments:    Smokes about 1 pack per week, cutting back  Vaping Use   Vaping Use: Never used  Substance and Sexual Activity   Alcohol use: Yes    Alcohol/week: 1.0 standard drink of alcohol    Types: 1 Glasses of wine per week    Comment: occasional   Drug use: Yes    Types: Marijuana    Comment: daily - helps with nausea   Sexual activity: Yes    Comment: declined condoms  Other Topics Concern   Not on file  Social History Narrative   Not on file   Social Determinants of Health   Financial Resource Strain: Not on file  Food Insecurity: Not on file  Transportation Needs: Not on file  Physical Activity: Not on file  Stress: Not on file  Social Connections: Not on file  Intimate Partner Violence: Not on file    Outpatient Medications Prior to Visit  Medication Sig Dispense Refill   acetaminophen (TYLENOL) 325 MG tablet Take 650 mg  by mouth every 6 (six) hours as needed for mild pain, moderate pain, fever or headache.     cabotegravir & rilpivirine ER (CABENUVA) 600 & 900 MG/3ML injection Inject 1 kit into the muscle every 30 (thirty) days. 6 mL 1   dextromethorphan-guaiFENesin (MUCINEX DM) 30-600 MG 12hr tablet Take 1 tablet by mouth 2 (two) times daily as needed for cough (20). 10 tablet 0   docusate sodium (COLACE) 100 MG capsule Take 100 mg by mouth 2 (two) times daily.     fluticasone (FLONASE) 50 MCG/ACT nasal spray Place 1 spray into both nostrils daily. 11.1 mL 2   ibuprofen (ADVIL) 600 MG tablet Take 1 tablet (600 mg total) by  mouth every 6 (six) hours as needed. 30 tablet 0   NONFORMULARY OR COMPOUNDED ITEM nitroglycerin/lidocaine rectal ointment     omeprazole (PRILOSEC) 40 MG capsule Take 1 capsule (40 mg total) by mouth daily. 90 capsule 3   promethazine (PHENERGAN) 12.5 MG tablet Take 1 tablet (12.5 mg total) by mouth every 6 (six) hours as needed for nausea or vomiting. 30 tablet 1   No facility-administered medications prior to visit.    No Known Allergies  ROS Review of Systems  Constitutional:  Negative for chills and fever.  Respiratory:  Negative for chest tightness and shortness of breath.   Cardiovascular:  Negative for chest pain and palpitations.  Skin:  Negative for rash.  Neurological:  Negative for dizziness and headaches.  Psychiatric/Behavioral:  Negative for self-injury and suicidal ideas.       Objective:    Physical Exam HENT:     Head: Normocephalic.     Right Ear: External ear normal.     Left Ear: External ear normal.     Mouth/Throat:     Mouth: Mucous membranes are moist.  Cardiovascular:     Rate and Rhythm: Normal rate and regular rhythm.     Pulses: Normal pulses.     Heart sounds: Normal heart sounds.  Pulmonary:     Effort: Pulmonary effort is normal.     Breath sounds: Normal breath sounds.  Abdominal:     Tenderness: There is no right CVA tenderness or left CVA tenderness.  Neurological:     Mental Status: He is alert.     BP 134/74   Pulse 98   Ht 5' 9.5" (1.765 m)   Wt 174 lb 0.6 oz (78.9 kg)   SpO2 97%   BMI 25.33 kg/m  Wt Readings from Last 3 Encounters:  04/15/22 174 lb 0.6 oz (78.9 kg)  03/17/22 171 lb (77.6 kg)  03/07/22 171 lb (77.6 kg)    Lab Results  Component Value Date   TSH 2.204 10/18/2021   Lab Results  Component Value Date   WBC 7.9 01/31/2022   HGB 15.2 01/31/2022   HCT 44.6 01/31/2022   MCV 91.8 01/31/2022   PLT 188 01/31/2022   Lab Results  Component Value Date   NA 136 01/31/2022   K 3.8 01/31/2022   CO2 25  01/31/2022   GLUCOSE 146 (H) 01/31/2022   BUN 14 01/31/2022   CREATININE 1.18 01/31/2022   BILITOT 0.7 10/18/2021   ALKPHOS 69 10/18/2021   AST 23 10/18/2021   ALT 29 10/18/2021   PROT 8.0 10/18/2021   ALBUMIN 3.8 10/18/2021   CALCIUM 9.4 01/31/2022   ANIONGAP 7 01/31/2022   Lab Results  Component Value Date   CHOL 167 10/18/2021   Lab Results  Component Value Date  HDL 21 (L) 10/18/2021   Lab Results  Component Value Date   LDLCALC 121 (H) 10/18/2021   Lab Results  Component Value Date   TRIG 127 10/18/2021   Lab Results  Component Value Date   CHOLHDL 8.0 10/18/2021   Lab Results  Component Value Date   HGBA1C 5.5 02/01/2022      Assessment & Plan:   Problem List Items Addressed This Visit       Digestive   Anal fissure and fistula    Immediate family history of colon cancer He follows up with GI, Dr.Carver, who  referred him to general surgery, Dr. Aviva Signs, to evaluate and treat anal fissures and possible anal fistula Dr. Aviva Signs, general surgery, was concerned that the fistula could possibly be affecting his external sphincter mechanism with the possibility of inflammatory bowel disease Therefore,  a referral was placed to colorectal surgery, with an appointment set for 04/19/22.        Other   HIV disease ([place])    He was intolerant to multiple antiviral medications, including Isentress and Truvada, despite premedication with promethazine His oral antiviral medication was changed to a once-monthly injectable medication called Cabenuva He had his second injection on 04/13/22 and reports doing well with the new medication His next administration is on 06/20/22      Constipation    Takes Colace 100 mg as needed to relieve constipation Encouraged continue use to stool softener      Need for immunization against influenza    Patient educated on CDC recommendation for the vaccine. Verbal consent was obtained from the patient, vaccine  administered by nurse, no sign of adverse reactions noted at this time. Patient education on arm soreness and use of tylenol or ibuprofen for this patient  was discussed. Patient educated on the signs and symptoms of adverse effect and advise to contact the office if they occur.      Other Visit Diagnoses     Flu vaccine need    -  Primary   Relevant Orders   Flu Vaccine QUAD 6+ mos PF IM (Fluarix Quad PF) (Completed)       No orders of the defined types were placed in this encounter.   Follow-up: Return in about 3 months (around 07/15/2022).    Alvira Monday, FNP

## 2022-04-15 NOTE — Assessment & Plan Note (Signed)
Takes Colace 100 mg as needed to relieve constipation Encouraged continue use to stool softener

## 2022-04-16 LAB — HIV-1 RNA QUANT-NO REFLEX-BLD
HIV 1 RNA Quant: NOT DETECTED Copies/mL
HIV-1 RNA Quant, Log: NOT DETECTED Log cps/mL

## 2022-05-02 ENCOUNTER — Ambulatory Visit: Payer: Self-pay | Admitting: Surgery

## 2022-05-02 DIAGNOSIS — K6289 Other specified diseases of anus and rectum: Secondary | ICD-10-CM | POA: Insufficient documentation

## 2022-05-02 DIAGNOSIS — Z800 Family history of malignant neoplasm of digestive organs: Secondary | ICD-10-CM | POA: Insufficient documentation

## 2022-05-02 DIAGNOSIS — B20 Human immunodeficiency virus [HIV] disease: Secondary | ICD-10-CM | POA: Diagnosis not present

## 2022-05-02 DIAGNOSIS — K603 Anal fistula: Secondary | ICD-10-CM | POA: Diagnosis not present

## 2022-05-10 ENCOUNTER — Other Ambulatory Visit: Payer: Self-pay | Admitting: Family Medicine

## 2022-05-10 DIAGNOSIS — K5909 Other constipation: Secondary | ICD-10-CM

## 2022-05-11 ENCOUNTER — Other Ambulatory Visit (HOSPITAL_COMMUNITY): Payer: Self-pay

## 2022-05-11 MED ORDER — ACETAMINOPHEN 325 MG PO TABS
650.0000 mg | ORAL_TABLET | Freq: Four times a day (QID) | ORAL | 1 refills | Status: AC | PRN
  Filled 2022-05-11: qty 120, 15d supply, fill #0

## 2022-05-11 MED ORDER — DOCUSATE SODIUM 100 MG PO CAPS
100.0000 mg | ORAL_CAPSULE | Freq: Two times a day (BID) | ORAL | 2 refills | Status: AC
  Filled 2022-05-11: qty 100, 50d supply, fill #0
  Filled 2022-06-29: qty 10, 5d supply, fill #0
  Filled 2022-07-23: qty 100, 50d supply, fill #0

## 2022-05-19 ENCOUNTER — Telehealth: Payer: Self-pay

## 2022-05-19 NOTE — Telephone Encounter (Signed)
Gary Barrett will you be writing the letter for the patient since Gary Barrett is inpatient? Patient requesting to get the letter before his next Cabenuva injection.

## 2022-05-19 NOTE — Telephone Encounter (Signed)
-----   Message from Golden Circle, [place] sent at 05/13/2022 12:30 PM EDT ----- Regarding: RE: Patient Req Documentation I am fine with providing this letter. As far as I am aware he has been showing up.  ----- Message ----- From: Allegra Grana, CMA Sent: 05/13/2022  11:58 AM EDT To: Golden Circle, FNP Subject: FW: Patient Req Documentation                   ----- Message ----- From: Carlton Adam Sent: 05/13/2022  11:43 AM EDT To: Esmond Plants, RPH-CPP; # Subject: Patient Req Documentation                      Morning!   Patient just called requesting documentation of proof that he receives consistent injections. From my understanding this is for proof of moving his mailbox. I wanted to send this on over to you all to see if this is something we can help him with or obtain for him.   I am welcome to schedule an appt for this. He is aware he may need one for this, but I would follow up with him once I knew more info.

## 2022-05-19 NOTE — Telephone Encounter (Signed)
I will defer to Marya Amsler as he will be in the clinic before Jayquan's next injection. Thanks! Estill Bamberg

## 2022-05-23 ENCOUNTER — Telehealth: Payer: Self-pay | Admitting: Family

## 2022-05-23 NOTE — Telephone Encounter (Signed)
Attempted to contact patient to inform him letter is ready for pickup. No answer. Voicemailbox full; unable to leave message. Will sent MyChart message.  Binnie Kand, RN

## 2022-05-23 NOTE — Telephone Encounter (Signed)
Received request for letters that Gary Barrett is receiving his injections on a consistent basis. Letter written per request.

## 2022-05-31 ENCOUNTER — Telehealth: Payer: Self-pay

## 2022-05-31 NOTE — Telephone Encounter (Signed)
PA for pantoprazole 40 mg tablets has been approved from 05/31/2022 through 05/30/2023. Approval letter to be scanned into patient's chart.

## 2022-06-02 ENCOUNTER — Encounter: Payer: Self-pay | Admitting: Internal Medicine

## 2022-06-20 ENCOUNTER — Ambulatory Visit (INDEPENDENT_AMBULATORY_CARE_PROVIDER_SITE_OTHER): Payer: BC Managed Care – PPO

## 2022-06-20 ENCOUNTER — Ambulatory Visit (INDEPENDENT_AMBULATORY_CARE_PROVIDER_SITE_OTHER): Payer: BC Managed Care – PPO | Admitting: Family

## 2022-06-20 ENCOUNTER — Other Ambulatory Visit: Payer: Self-pay

## 2022-06-20 ENCOUNTER — Encounter: Payer: Self-pay | Admitting: Family

## 2022-06-20 VITALS — Wt 171.4 lb

## 2022-06-20 DIAGNOSIS — K602 Anal fissure, unspecified: Secondary | ICD-10-CM | POA: Diagnosis not present

## 2022-06-20 DIAGNOSIS — B20 Human immunodeficiency virus [HIV] disease: Secondary | ICD-10-CM

## 2022-06-20 DIAGNOSIS — K603 Anal fistula: Secondary | ICD-10-CM

## 2022-06-20 DIAGNOSIS — Z23 Encounter for immunization: Secondary | ICD-10-CM | POA: Diagnosis not present

## 2022-06-20 DIAGNOSIS — A539 Syphilis, unspecified: Secondary | ICD-10-CM

## 2022-06-20 DIAGNOSIS — Z0000 Encounter for general adult medical examination without abnormal findings: Secondary | ICD-10-CM | POA: Diagnosis not present

## 2022-06-20 MED ORDER — CABOTEGRAVIR & RILPIVIRINE ER 600 & 900 MG/3ML IM SUER
1.0000 | Freq: Once | INTRAMUSCULAR | Status: AC
  Administered 2022-06-20: 1 via INTRAMUSCULAR

## 2022-06-20 NOTE — Assessment & Plan Note (Signed)
Gary Barrett was previously treated for syphilis in March 2023 with positive titer of 1:2048. No symptoms. Recheck RPR. Educated regarding RPR titer and when treatment would be anticipated.

## 2022-06-20 NOTE — Assessment & Plan Note (Signed)
Discussed importance of safe sexual practice, family planning and condom use. Condoms and STD testing offered.  Covid vaccine updated today.

## 2022-06-20 NOTE — Progress Notes (Signed)
Brief Narrative   Patient ID: Gary Barrett, male    DOB: 1990-11-28, 31 y.o.   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 Initial viral load was 40,500 with CD4 count of 199. Genotype with no significant medication resistant mutations. No history of opportunistic infection. Gary Barrett negative. Entered care at [facility]. Had nausea with multiple regimens including Biktarvy, Dovato, Delstrigo, Isentress/Truvada. Now on Cabenuva.   Subjective:    Chief Complaint  Patient presents with   Follow-up    HPI:  Gary Barrett is a 31 y.o. male with HIV disease last seen on 04/13/22 by Alfonse Spruce PharmD, CPP for maintenance dose of Cabenuva with good tolerance to medication. Viral load was undetectable and CD4 count 546. In the interim has been seen by General Surgery for potential anal fistula. Here today for routine follow up and next injection.   Gary Barrett has been doing well since his last office visit and tolerated his injections well with some soreness. Has noticed nausea is significantly better since increasing his pantoprazole and now omeprazole. Working in scheduling for surgery for anal fistula prior to the end of the year and working through [place]. Requesting Covid vaccination. Influenza vaccination up to date. Condoms and STD testing offered.   Denies fevers, chills, night sweats, headaches, changes in vision, neck pain/stiffness, nausea, diarrhea, vomiting, lesions or rashes.   No Known Allergies    Outpatient Medications Prior to Visit  Medication Sig Dispense Refill   cabotegravir & rilpivirine ER (CABENUVA) 600 & 900 MG/3ML injection Inject 1 kit into the muscle every 30 (thirty) days. 6 mL 1   dextromethorphan-guaiFENesin (MUCINEX DM) 30-600 MG 12hr tablet Take 1 tablet by mouth 2 (two) times daily as needed for cough (20). 10 tablet 0   docusate sodium (COLACE) 100 MG capsule Take 1 capsule (100  mg total) by mouth 2 (two) times daily. 10 capsule 2   fluticasone (FLONASE) 50 MCG/ACT nasal spray Place 1 spray into both nostrils daily. 11.1 mL 2   ibuprofen (ADVIL) 600 MG tablet Take 1 tablet (600 mg total) by mouth every 6 (six) hours as needed. 30 tablet 0   NONFORMULARY OR COMPOUNDED ITEM nitroglycerin/lidocaine rectal ointment     omeprazole (PRILOSEC) 40 MG capsule Take 1 capsule (40 mg total) by mouth daily. 90 capsule 3   promethazine (PHENERGAN) 12.5 MG tablet Take 1 tablet (12.5 mg total) by mouth every 6 (six) hours as needed for nausea or vomiting. 30 tablet 1   No facility-administered medications prior to visit.     Past Medical History:  Diagnosis Date   HIV (human immunodeficiency virus infection) [facility])      Past Surgical History:  Procedure Laterality Date   BIOPSY  02/08/2022   Procedure: BIOPSY;  Surgeon: Eloise Harman, DO;  [facility];  Service: Endoscopy;;   ESOPHAGOGASTRODUODENOSCOPY (EGD) WITH PROPOFOL N/A 02/08/2022   Procedure: ESOPHAGOGASTRODUODENOSCOPY (EGD) WITH PROPOFOL;  Surgeon: Eloise Harman, DO;  [facility];  Service: Endoscopy;  Laterality: N/A;  1:00pm   INNER EAR SURGERY        Review of Systems  Constitutional:  Negative for appetite change, chills, fatigue, fever and unexpected weight change.  Eyes:  Negative for visual disturbance.  Respiratory:  Negative for cough, chest tightness, shortness of breath and wheezing.   Cardiovascular:  Negative for chest pain and leg swelling.  Gastrointestinal:  Negative for abdominal pain, constipation, diarrhea, nausea and vomiting.  Genitourinary:  Negative for dysuria, flank pain, frequency, genital sores, hematuria and urgency.  Skin:  Negative for rash.  Allergic/Immunologic: Negative for immunocompromised state.  Neurological:  Negative for dizziness and headaches.      Objective:    Wt 171 lb 6.4 oz (77.7 kg)   BMI 24.95 kg/m  Nursing note and vital signs  reviewed.  Physical Exam Constitutional:      General: He is not in acute distress.    Appearance: He is well-developed.  Eyes:     Conjunctiva/sclera: Conjunctivae normal.  Cardiovascular:     Rate and Rhythm: Normal rate and regular rhythm.     Heart sounds: Normal heart sounds. No murmur heard.    No friction rub. No gallop.  Pulmonary:     Effort: Pulmonary effort is normal. No respiratory distress.     Breath sounds: Normal breath sounds. No wheezing or rales.  Chest:     Chest wall: No tenderness.  Abdominal:     General: Bowel sounds are normal.     Palpations: Abdomen is soft.     Tenderness: There is no abdominal tenderness.  Musculoskeletal:     Cervical back: Neck supple.  Lymphadenopathy:     Cervical: No cervical adenopathy.  Skin:    General: Skin is warm and dry.     Findings: No rash.  Neurological:     Mental Status: He is alert and oriented to person, place, and time.  Psychiatric:        Behavior: Behavior normal.        Thought Content: Thought content normal.        Judgment: Judgment normal.         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         Other   HIV disease ([place]) - Primary    Gary Barrett continues to have well controlled virus and good adherence and tolerance to [place]. Reviewed previous lab work and discussed plan of care. Check lab work today and next injection of Cabenuva. Plan for follow up in 2 month for next injection and with me in 4 months.       Relevant Orders   Basic metabolic panel   HIV-1 RNA quant-no reflex-bld   T-helper cell (CD4)- ([facility] clinic only)   Healthcare maintenance    Discussed importance of  safe sexual practice, family planning and condom use. Condoms and STD testing offered.  Covid vaccine updated today.      Syphilis    Gary Barrett was previously treated for syphilis in March 2023 with positive titer of 1:2048. No symptoms. Recheck RPR. Educated regarding RPR titer and when treatment would be anticipated.       Relevant Orders   RPR     I am having Gary Barrett. Gary Barrett maintain his ibuprofen, dextromethorphan-guaiFENesin, fluticasone, omeprazole, cabotegravir & rilpivirine ER, NONFORMULARY OR COMPOUNDED ITEM, promethazine, and docusate sodium. We administered cabotegravir & rilpivirine ER.   Meds ordered this encounter  Medications   cabotegravir & rilpivirine ER (CABENUVA) 600 & 900 MG/3ML injection 1 kit     Follow-up: Return in about 2 months (around 08/20/2022), or if symptoms worsen or fail to improve.   Terri Piedra, MSN, FNP-C Nurse Practitioner [facility] [place] number: [phone]

## 2022-06-20 NOTE — Assessment & Plan Note (Signed)
Gary Barrett continues to have well controlled virus and good adherence and tolerance to [place]. Reviewed previous lab work and discussed plan of care. Check lab work today and next injection of Cabenuva. Plan for follow up in 2 month for next injection and with me in 4 months.

## 2022-06-20 NOTE — Assessment & Plan Note (Signed)
Working with General Surgery for surgical resolution.

## 2022-06-20 NOTE — Patient Instructions (Addendum)
Nice to see you.  We will check your lab work today.  Plan for follow up in 2 months or sooner if needed with lab work on the same day and follow up with Tammy Sours in 4 months.   Have a great day and stay safe!

## 2022-06-21 LAB — T-HELPER CELL (CD4) - (RCID CLINIC ONLY)
CD4 % Helper T Cell: 32 % — ABNORMAL LOW (ref 33–65)
CD4 T Cell Abs: 554 /uL (ref 400–1790)

## 2022-06-22 LAB — HIV-1 RNA QUANT-NO REFLEX-BLD
HIV 1 RNA Quant: NOT DETECTED Copies/mL
HIV-1 RNA Quant, Log: NOT DETECTED Log cps/mL

## 2022-06-22 LAB — BASIC METABOLIC PANEL
BUN: 15 mg/dL (ref 7–25)
CO2: 23 mmol/L (ref 20–32)
Calcium: 9.9 mg/dL (ref 8.6–10.3)
Chloride: 105 mmol/L (ref 98–110)
Creat: 1.17 mg/dL (ref 0.60–1.26)
Glucose, Bld: 97 mg/dL (ref 65–99)
Potassium: 4.2 mmol/L (ref 3.5–5.3)
Sodium: 138 mmol/L (ref 135–146)

## 2022-06-22 LAB — RPR: RPR Ser Ql: REACTIVE — AB

## 2022-06-22 LAB — RPR TITER: RPR Titer: 1:128 {titer} — ABNORMAL HIGH

## 2022-06-22 LAB — FLUORESCENT TREPONEMAL AB(FTA)-IGG-BLD: Fluorescent Treponemal ABS: REACTIVE — AB

## 2022-06-29 ENCOUNTER — Other Ambulatory Visit (HOSPITAL_COMMUNITY): Payer: Self-pay

## 2022-07-15 ENCOUNTER — Ambulatory Visit (INDEPENDENT_AMBULATORY_CARE_PROVIDER_SITE_OTHER): Payer: BC Managed Care – PPO | Admitting: Family Medicine

## 2022-07-15 ENCOUNTER — Encounter: Payer: Self-pay | Admitting: Family Medicine

## 2022-07-15 VITALS — BP 118/78 | HR 91 | Wt 177.0 lb

## 2022-07-15 DIAGNOSIS — K603 Anal fistula: Secondary | ICD-10-CM | POA: Diagnosis not present

## 2022-07-15 DIAGNOSIS — Z2911 Encounter for prophylactic immunotherapy for respiratory syncytial virus (RSV): Secondary | ICD-10-CM | POA: Diagnosis not present

## 2022-07-15 DIAGNOSIS — K602 Anal fissure, unspecified: Secondary | ICD-10-CM | POA: Diagnosis not present

## 2022-07-15 DIAGNOSIS — B20 Human immunodeficiency virus [HIV] disease: Secondary | ICD-10-CM | POA: Diagnosis not present

## 2022-07-15 NOTE — Assessment & Plan Note (Addendum)
CD4 count on 06/20/2022 is 554 He followed up with infectious disease on 06/20/2022 and will be following up next on 08/20/2022 The patient is on cabenuva injection 600 & 900 mg/38ml injection every 2 months He reports doing well with current treatment regimen with no complaints or concerns voiced

## 2022-07-15 NOTE — Assessment & Plan Note (Signed)
He inquired if he should get the RSV vaccine Encouraged the patient to get the RSV vaccine, given his HIV history He reports attempting to get the RSV vaccine at [facility] and was told that he was not a candidate Inform the patient to let me know if he needs a doctor's note to get the vaccination at [facility], and a note will be provided  Inform the patient that we do not currently have the vaccination to administer in the clinic

## 2022-07-15 NOTE — Patient Instructions (Signed)
I appreciate the opportunity to provide care to you today!    Follow up:  3 months CPE  Labs: please stop by the lab during the week  to get your blood drawn (CBC, CMP, TSH, Lipid profile, HgA1c, Vit D)    Please continue to a heart-healthy diet and increase your physical activities. Try to exercise for at least five times a week.      It was a pleasure to see you and I look forward to continuing to work together on your health and well-being. Please do not hesitate to call the office if you need care or have questions about your care.   Have a wonderful day and week. With Gratitude, Gilmore Laroche MSN, FNP-BC

## 2022-07-15 NOTE — Assessment & Plan Note (Signed)
He is scheduled for annual fistula surgery on 08/11/2022 Encouraged to follow-up with general surgery as needed

## 2022-07-15 NOTE — Progress Notes (Signed)
Established Patient Office Visit  Subjective:  Patient ID: Gary Barrett, male    DOB: 08/05/1990  Age: 31 y.o. MRN: [id number]  CC:  Chief Complaint  Patient presents with   Hypertension    Patient is here for f/u of HTN. Not on medication.     HPI Gary Barrett is a 31 y.o. male with past medical history of HIV disease, constipation, anal fistula and fissure presents for f/u of  chronic medical conditions. For the details of today's visit, please refer to the assessment and plan.     Past Medical History:  Diagnosis Date   HIV (human immunodeficiency virus infection) [facility])     Past Surgical History:  Procedure Laterality Date   BIOPSY  02/08/2022   Procedure: BIOPSY;  Surgeon: Eloise Harman, DO;  [facility];  Service: Endoscopy;;   ESOPHAGOGASTRODUODENOSCOPY (EGD) WITH PROPOFOL N/A 02/08/2022   Procedure: ESOPHAGOGASTRODUODENOSCOPY (EGD) WITH PROPOFOL;  Surgeon: Eloise Harman, DO;  [facility];  Service: Endoscopy;  Laterality: N/A;  1:00pm   INNER EAR SURGERY      Family History  Problem Relation Age of Onset   Heart disease Mother    CAD Mother    Colon cancer Maternal Grandmother    Sudden death Cousin     Social History   Socioeconomic History   Marital status: Single    Spouse name: Not on file   Number of children: Not on file   Years of education: 16   Highest education level: Not on file  Occupational History   Not on file  Tobacco Use   Smoking status: Some Days    Packs/day: 0.10    Types: Cigarettes    Passive exposure: Never   Smokeless tobacco: Never   Tobacco comments:    Smokes about 1 pack per week, cutting back  Vaping Use   Vaping Use: Never used  Substance and Sexual Activity   Alcohol use: Yes    Alcohol/week: 1.0 standard drink of alcohol    Types: 1 Glasses of wine per week    Comment: occasional   Drug use: Yes    Types: Marijuana    Comment: daily - helps with nausea   Sexual  activity: Yes    Comment: declined condoms  Other Topics Concern   Not on file  Social History Narrative   Not on file   Social Determinants of Health   Financial Resource Strain: Not on file  Food Insecurity: Not on file  Transportation Needs: Not on file  Physical Activity: Not on file  Stress: Not on file  Social Connections: Not on file  Intimate Partner Violence: Not on file    Outpatient Medications Prior to Visit  Medication Sig Dispense Refill   cabotegravir & rilpivirine ER (CABENUVA) 600 & 900 MG/3ML injection Inject 1 kit into the muscle every 30 (thirty) days. 6 mL 1   docusate sodium (COLACE) 100 MG capsule Take 1 capsule (100 mg total) by mouth 2 (two) times daily. 10 capsule 2   fluticasone (FLONASE) 50 MCG/ACT nasal spray Place 1 spray into both nostrils daily. 11.1 mL 2   ibuprofen (ADVIL) 600 MG tablet Take 1 tablet (600 mg total) by mouth every 6 (six) hours as needed. 30 tablet 0   NONFORMULARY OR COMPOUNDED ITEM nitroglycerin/lidocaine rectal ointment     omeprazole (PRILOSEC) 40 MG capsule Take 1 capsule (40 mg total) by mouth daily. 90 capsule 3   dextromethorphan-guaiFENesin (MUCINEX  DM) 30-600 MG 12hr tablet Take 1 tablet by mouth 2 (two) times daily as needed for cough (20). 10 tablet 0   No facility-administered medications prior to visit.    No Known Allergies  ROS Review of Systems  Constitutional:  Negative for fatigue and fever.  Eyes:  Negative for visual disturbance.  Respiratory:  Negative for chest tightness and shortness of breath.   Cardiovascular:  Negative for chest pain and palpitations.  Neurological:  Negative for dizziness and headaches.      Objective:    Physical Exam HENT:     Head: Normocephalic.     Right Ear: External ear normal.     Left Ear: External ear normal.     Nose: No congestion or rhinorrhea.     Mouth/Throat:     Mouth: Mucous membranes are moist.  Cardiovascular:     Rate and Rhythm: Regular rhythm.      Heart sounds: No murmur heard. Pulmonary:     Effort: No respiratory distress.     Breath sounds: Normal breath sounds.  Neurological:     Mental Status: He is alert.     BP 118/78   Pulse 91   Wt 177 lb (80.3 kg)   SpO2 97%   BMI 25.76 kg/m  Wt Readings from Last 3 Encounters:  07/15/22 177 lb (80.3 kg)  06/20/22 171 lb 6.4 oz (77.7 kg)  04/15/22 174 lb 0.6 oz (78.9 kg)    Lab Results  Component Value Date   TSH 2.204 10/18/2021   Lab Results  Component Value Date   WBC 7.9 01/31/2022   HGB 15.2 01/31/2022   HCT 44.6 01/31/2022   MCV 91.8 01/31/2022   PLT 188 01/31/2022   Lab Results  Component Value Date   NA 138 06/20/2022   K 4.2 06/20/2022   CO2 23 06/20/2022   GLUCOSE 97 06/20/2022   BUN 15 06/20/2022   CREATININE 1.17 06/20/2022   BILITOT 0.7 10/18/2021   ALKPHOS 69 10/18/2021   AST 23 10/18/2021   ALT 29 10/18/2021   PROT 8.0 10/18/2021   ALBUMIN 3.8 10/18/2021   CALCIUM 9.9 06/20/2022   ANIONGAP 7 01/31/2022   Lab Results  Component Value Date   CHOL 167 10/18/2021   Lab Results  Component Value Date   HDL 21 (L) 10/18/2021   Lab Results  Component Value Date   LDLCALC 121 (H) 10/18/2021   Lab Results  Component Value Date   TRIG 127 10/18/2021   Lab Results  Component Value Date   CHOLHDL 8.0 10/18/2021   Lab Results  Component Value Date   HGBA1C 5.5 02/01/2022      Assessment & Plan:  HIV disease ([place]) Assessment & Plan: CD4 count on 06/20/2022 is 554 He followed up with infectious disease on 06/20/2022 and will be following up next on 08/20/2022 The patient is on cabenuva injection 600 & 900 mg/67m injection every 2 months He reports doing well with current treatment regimen with no complaints or concerns voiced    Anal fissure and fistula Assessment & Plan: He is scheduled for annual fistula surgery on 08/11/2022 Encouraged to follow-up with general surgery as needed   Need for RSV vaccination Assessment &  Plan: He inquired if he should get the RSV vaccine Encouraged the patient to get the RSV vaccine, given his HIV history He reports attempting to get the RSV vaccine at [facility] and was told that he was not a candidate Inform the patient to  let me know if he needs a doctor's note to get the vaccination at [facility], and a note will be provided  Inform the patient that we do not currently have the vaccination to administer in the clinic     Follow-up: Return in about 3 months (around 10/14/2022) for CPE.   Alvira Monday, FNP

## 2022-07-23 ENCOUNTER — Other Ambulatory Visit (HOSPITAL_COMMUNITY): Payer: Self-pay

## 2022-07-25 DIAGNOSIS — Z419 Encounter for procedure for purposes other than remedying health state, unspecified: Secondary | ICD-10-CM | POA: Diagnosis not present

## 2022-08-02 NOTE — Progress Notes (Deleted)
GI Office Note    Referring Provider: Alvira Monday, [place] Primary Care Physician:  Alvira Monday, [place] Primary Gastroenterologist: Elon Alas. Abbey Chatters, DO  Date:  08/02/2022  ID:  Gary Barrett, DOB [birth year], MRN [id number]   Chief Complaint   No chief complaint on file.   History of Present Illness  Gary Barrett is a 32 y.o. male with a history of recently diagnosed HIV, anal fissures, GERD*** presenting today with complaint of ***   EGD 02/08/2022: -*** -Advised to start pantoprazole daily  Abdominal ultrasound 02/25/2022: -No cholelithiasis or acute cholecystitis -No focal liver lesion.  No biliary ductal dilation.  Patent portal vein.  Last office visit 03/03/2022. ***Diagnosed with HIV in March 2023, following with ID.  HIV medications: Severe nausea and occasional vomiting.  Zofran helps nausea makes him constipated.  Phenergan helping with nausea.  Rectal pain since October 2022 primarily with bowel movements, sometimes very severe.  Proctofoam and hydrocortisone cream without relief.  Perianal exam 12/23/2021 revealed multiple anal fissures, too tender to perform DRE.  Started on nitro/lidocaine cream and reported drastic improvement of symptoms at office visit.  Continues to have intermittent rectal bleeding and mucus discharge.  Had not started taking pantoprazole daily as recommended after EGD.  Referred to general surgery for anal fissure/possible fistula given mucous.  Consider hemorrhoid banding for internal hemorrhoids pending surgical evaluation.  Advised to begin PPI daily.  Continue topical rectal cream.  Seen by Dr. Arnoldo Morale 03/17/2022.  Per note, patient mentions her mother was diagnosed with colon cancer in her 60s.  Anal fissure with fistula noted on assessment.  Dr. Arnoldo Morale felt as though fistula was possibly affecting his external sphincter mechanism and given possibility of inflammatory bowel disease and no history of colonoscopy and the extent of his pain,  he felt as though colorectal surgery would be more appropriate.  He was referred to [facility] surgery.  Seen by [facility] surgery 05/02/2022.  Perirectal abscess/fistula to left posterior perirectal opening about 2 cm into the anal verge with painful inflammatory corn heading to posterior midline.  Some expression of purulence from anal verge with expression.  Anal fissure difficult to assess, possible posterior midline.  Advised patient would benefit from anorectal examination under anesthesia and if intersphincteric involvement is suspected, will plan for LIFT repair or fistulotomy.  Today: Reportedly awaiting rectal surgery.    Current Outpatient Medications  Medication Sig Dispense Refill   cabotegravir & rilpivirine ER (CABENUVA) 600 & 900 MG/3ML injection Inject 1 kit into the muscle every 30 (thirty) days. 6 mL 1   dextromethorphan-guaiFENesin (MUCINEX DM) 30-600 MG 12hr tablet Take 1 tablet by mouth 2 (two) times daily as needed for cough (20). 10 tablet 0   docusate sodium (COLACE) 100 MG capsule Take 1 capsule (100 mg total) by mouth 2 (two) times daily. 10 capsule 2   fluticasone (FLONASE) 50 MCG/ACT nasal spray Place 1 spray into both nostrils daily. 11.1 mL 2   ibuprofen (ADVIL) 600 MG tablet Take 1 tablet (600 mg total) by mouth every 6 (six) hours as needed. 30 tablet 0   NONFORMULARY OR COMPOUNDED ITEM nitroglycerin/lidocaine rectal ointment     omeprazole (PRILOSEC) 40 MG capsule Take 1 capsule (40 mg total) by mouth daily. 90 capsule 3   No current facility-administered medications for this visit.    Past Medical History:  Diagnosis Date   HIV (human immunodeficiency virus infection) ([place])     Past Surgical History:  Procedure Laterality Date  BIOPSY  02/08/2022   Procedure: BIOPSY;  Surgeon: Eloise Harman, DO;  [facility];  Service: Endoscopy;;   ESOPHAGOGASTRODUODENOSCOPY (EGD) WITH PROPOFOL N/A 02/08/2022   Procedure:  ESOPHAGOGASTRODUODENOSCOPY (EGD) WITH PROPOFOL;  Surgeon: Eloise Harman, DO;  [facility];  Service: Endoscopy;  Laterality: N/A;  1:00pm   INNER EAR SURGERY      Family History  Problem Relation Age of Onset   Heart disease Mother    CAD Mother    Colon cancer Maternal Grandmother    Sudden death Cousin     Allergies as of 08/03/2022   (No Known Allergies)    Social History   Socioeconomic History   Marital status: Single    Spouse name: Not on file   Number of children: Not on file   Years of education: 16   Highest education level: Not on file  Occupational History   Not on file  Tobacco Use   Smoking status: Some Days    Packs/day: 0.10    Types: Cigarettes    Passive exposure: Never   Smokeless tobacco: Never   Tobacco comments:    Smokes about 1 pack per week, cutting back  Vaping Use   Vaping Use: Never used  Substance and Sexual Activity   Alcohol use: Yes    Alcohol/week: 1.0 standard drink of alcohol    Types: 1 Glasses of wine per week    Comment: occasional   Drug use: Yes    Types: Marijuana    Comment: daily - helps with nausea   Sexual activity: Yes    Comment: declined condoms  Other Topics Concern   Not on file  Social History Narrative   Not on file   Social Determinants of Health   Financial Resource Strain: Not on file  Food Insecurity: Not on file  Transportation Needs: Not on file  Physical Activity: Not on file  Stress: Not on file  Social Connections: Not on file     Review of Systems   Gen: Denies fever, chills, anorexia. Denies fatigue, weakness, weight loss.  CV: Denies chest pain, palpitations, syncope, peripheral edema, and claudication. Resp: Denies dyspnea at rest, cough, wheezing, coughing up blood, and pleurisy. GI: See HPI Derm: Denies rash, itching, dry skin Psych: Denies depression, anxiety, memory loss, confusion. No homicidal or suicidal ideation.  Heme: Denies bruising, bleeding, and enlarged  lymph nodes.   Physical Exam   There were no vitals taken for this visit.  General:   Alert and oriented. No distress noted. Pleasant and cooperative.  Head:  Normocephalic and atraumatic. Eyes:  Conjuctiva clear without scleral icterus. Mouth:  Oral mucosa pink and moist. Good dentition. No lesions. Lungs:  Clear to auscultation bilaterally. No wheezes, rales, or rhonchi. No distress.  Heart:  S1, S2 present without murmurs appreciated.  Abdomen:  +BS, soft, non-tender and non-distended. No rebound or guarding. No HSM or masses noted. Rectal: *** Msk:  Symmetrical without gross deformities. Normal posture. Extremities:  Without edema. Neurologic:  Alert and  oriented x4 Psych:  Alert and cooperative. Normal mood and affect.   Assessment  Gary Barrett is a 32 y.o. male with a history of recently diagnosed HIV, anal fissures, GERD*** presenting today for follow-up.  Rectal pain, anal fissure with fistula:   GERD:   PLAN   ***     Venetia Night, MSN, FNP-BC, AGACNP-BC [facility]

## 2022-08-03 ENCOUNTER — Ambulatory Visit: Payer: BC Managed Care – PPO | Admitting: Gastroenterology

## 2022-08-10 ENCOUNTER — Other Ambulatory Visit: Payer: Self-pay

## 2022-08-10 ENCOUNTER — Ambulatory Visit (INDEPENDENT_AMBULATORY_CARE_PROVIDER_SITE_OTHER): Payer: BC Managed Care – PPO | Admitting: Pharmacist

## 2022-08-10 DIAGNOSIS — B20 Human immunodeficiency virus [HIV] disease: Secondary | ICD-10-CM | POA: Diagnosis not present

## 2022-08-10 MED ORDER — CABOTEGRAVIR & RILPIVIRINE ER 600 & 900 MG/3ML IM SUER
1.0000 | Freq: Once | INTRAMUSCULAR | Status: AC
  Administered 2022-08-10: 1 via INTRAMUSCULAR

## 2022-08-10 NOTE — Progress Notes (Signed)
HPI: Gary Barrett is a 32 y.o. male who presents to the [place] clinic for [place] administration.  Patient Active Problem List   Diagnosis Date Noted   Need for RSV vaccination 07/15/2022   Syphilis 06/20/2022   Anal fissure and fistula 04/15/2022   Need for immunization against influenza 04/15/2022   Elevated BP without diagnosis of hypertension 12/28/2021   Constipation 12/07/2021   Hemorrhoids 11/17/2021   Healthcare maintenance 11/17/2021   HIV disease ([place]) 10/21/2021   Encounter for screening for HIV    Syncope 2021/11/15   Diaphoresis Nov 15, 2021   FH: sudden cardiac death (SCD)    FH: premature coronary heart disease     Patient's Medications  New Prescriptions   No medications on file  Previous Medications   CABOTEGRAVIR & RILPIVIRINE ER (CABENUVA) 600 & 900 MG/3ML INJECTION    Inject 1 kit into the muscle every 30 (thirty) days.   DEXTROMETHORPHAN-GUAIFENESIN (MUCINEX DM) 30-600 MG 12HR TABLET    Take 1 tablet by mouth 2 (two) times daily as needed for cough (20).   DOCUSATE SODIUM (COLACE) 100 MG CAPSULE    Take 1 capsule (100 mg total) by mouth 2 (two) times daily.   FLUTICASONE (FLONASE) 50 MCG/ACT NASAL SPRAY    Place 1 spray into both nostrils daily.   IBUPROFEN (ADVIL) 600 MG TABLET    Take 1 tablet (600 mg total) by mouth every 6 (six) hours as needed.   NONFORMULARY OR COMPOUNDED ITEM    nitroglycerin/lidocaine rectal ointment   OMEPRAZOLE (PRILOSEC) 40 MG CAPSULE    Take 1 capsule (40 mg total) by mouth daily.  Modified Medications   No medications on file  Discontinued Medications   No medications on file    Allergies: No Known Allergies  Past Medical History: Past Medical History:  Diagnosis Date   HIV (human immunodeficiency virus infection) ([place])     Social History: Social History   Socioeconomic History   Marital status: Single    Spouse name: Not on file   Number of children: Not on file   Years of education: 16   Highest  education level: Not on file  Occupational History   Not on file  Tobacco Use   Smoking status: Some Days    Packs/day: 0.10    Types: Cigarettes    Passive exposure: Never   Smokeless tobacco: Never   Tobacco comments:    Smokes about 1 pack per week, cutting back  Vaping Use   Vaping Use: Never used  Substance and Sexual Activity   Alcohol use: Yes    Alcohol/week: 1.0 standard drink of alcohol    Types: 1 Glasses of wine per week    Comment: occasional   Drug use: Yes    Types: Marijuana    Comment: daily - helps with nausea   Sexual activity: Yes    Comment: declined condoms  Other Topics Concern   Not on file  Social History Narrative   Not on file   Social Determinants of Health   Financial Resource Strain: Not on file  Food Insecurity: Not on file  Transportation Needs: Not on file  Physical Activity: Not on file  Stress: Not on file  Social Connections: Not on file    Labs: Lab Results  Component Value Date   HIV1RNAQUANT Not Detected 06/20/2022   HIV1RNAQUANT Not Detected 04/13/2022   HIV1RNAQUANT 26 (H) 03/07/2022   CD4TABS 554 06/20/2022   CD4TABS 546 03/07/2022   CD4TABS 554  02/02/2022    RPR and STI Lab Results  Component Value Date   LABRPR REACTIVE (A) 06/20/2022   LABRPR REACTIVE (A) 10/21/2021   RPRTITER 1:128 (H) 06/20/2022   RPRTITER 1:2,048 (H) 10/21/2021        No data to display          Hepatitis B Lab Results  Component Value Date   HEPBSAB REACTIVE (A) 10/21/2021   HEPBSAG NON-REACTIVE 10/21/2021   HEPBCAB NON-REACTIVE 10/21/2021   Hepatitis C Lab Results  Component Value Date   HEPCAB NON-REACTIVE 10/21/2021   Hepatitis A Lab Results  Component Value Date   HAV REACTIVE (A) 10/21/2021   Lipids: Lab Results  Component Value Date   CHOL 167 10/18/2021   TRIG 127 10/18/2021   HDL 21 (L) 10/18/2021   CHOLHDL 8.0 10/18/2021   VLDL 25 10/18/2021   LDLCALC 121 (H) 10/18/2021    TARGET DATE:  The 23rd of  the month  Current HIV Regimen: Cabenuva  Assessment: Moritz presents today for their maintenance Cabenuva injections. Initial/past injections were tolerated well without issues. No problems with systemic effects of injections. Patient states he has been experiencing nausea for 3-4 days prior to his next injections; reviewed this is likely due to anxiety as he anticipates the next injection. He continues to premedicate with Tylenol and finds this very helpful in preventing extra soreness.   Administered cabotegravir 600mg /62mL in left upper outer quadrant of the gluteal muscle. Administered rilpivirine 900 mg/65mL in the right upper outer quadrant of the gluteal muscle. Monitored patient for 10 minutes after injection. Injections were tolerated well without issue. Patient will follow up in 2 months for next injection. Will defer HIV RNA to his next visit with Marya Amsler as it was recently assessed in November.  He states he has not been sexually active and politely declines STI testing today. He would like to receive Menveo and HPV vaccine series but would prefer to space them out from his Cabenuva injections. Will schedule him in between injections to receive vaccines. Discussed that he is not a candidate for the RSV vaccine as he is young and not immunosuppressed.  Plan: - Cabenuva injections administered - Follow-up with me on 2/14 for vaccines - Next injections scheduled for 3/19 with Marya Amsler and 5/22 with me  - Call with any issues or questions  Alfonse Spruce, PharmD, CPP, BCIDP, [place] Clinical Pharmacist Practitioner Infectious Diseases [facility]

## 2022-08-12 DIAGNOSIS — A630 Anogenital (venereal) warts: Secondary | ICD-10-CM | POA: Diagnosis not present

## 2022-08-12 DIAGNOSIS — K642 Third degree hemorrhoids: Secondary | ICD-10-CM | POA: Diagnosis not present

## 2022-08-12 DIAGNOSIS — L918 Other hypertrophic disorders of the skin: Secondary | ICD-10-CM | POA: Diagnosis not present

## 2022-08-12 DIAGNOSIS — K643 Fourth degree hemorrhoids: Secondary | ICD-10-CM | POA: Diagnosis not present

## 2022-08-12 DIAGNOSIS — K603 Anal fistula: Secondary | ICD-10-CM | POA: Diagnosis not present

## 2022-08-12 DIAGNOSIS — K644 Residual hemorrhoidal skin tags: Secondary | ICD-10-CM | POA: Diagnosis not present

## 2022-08-12 DIAGNOSIS — K604 Rectal fistula: Secondary | ICD-10-CM | POA: Diagnosis not present

## 2022-08-13 ENCOUNTER — Emergency Department (HOSPITAL_COMMUNITY)
Admission: EM | Admit: 2022-08-13 | Discharge: 2022-08-13 | Disposition: A | Discharge status: Home / Self Care | Payer: BC Managed Care – PPO | Attending: Emergency Medicine | Admitting: Emergency Medicine

## 2022-08-13 ENCOUNTER — Encounter (HOSPITAL_COMMUNITY): Payer: Self-pay

## 2022-08-13 ENCOUNTER — Encounter (HOSPITAL_COMMUNITY): Payer: Self-pay | Admitting: Emergency Medicine

## 2022-08-13 ENCOUNTER — Other Ambulatory Visit: Payer: Self-pay

## 2022-08-13 DIAGNOSIS — K625 Hemorrhage of anus and rectum: Secondary | ICD-10-CM | POA: Diagnosis not present

## 2022-08-13 DIAGNOSIS — G8918 Other acute postprocedural pain: Secondary | ICD-10-CM

## 2022-08-13 DIAGNOSIS — Z21 Asymptomatic human immunodeficiency virus [HIV] infection status: Secondary | ICD-10-CM | POA: Diagnosis not present

## 2022-08-13 LAB — CBC WITH DIFFERENTIAL/PLATELET
Abs Immature Granulocytes: 0.02 10*3/uL (ref 0.00–0.07)
Basophils Absolute: 0 10*3/uL (ref 0.0–0.1)
Basophils Relative: 0 %
Eosinophils Absolute: 0 10*3/uL (ref 0.0–0.5)
Eosinophils Relative: 0 %
HCT: 41.2 % (ref 39.0–52.0)
Hemoglobin: 13.9 g/dL (ref 13.0–17.0)
Immature Granulocytes: 0 %
Lymphocytes Relative: 16 %
Lymphs Abs: 2.1 10*3/uL (ref 0.7–4.0)
MCH: 30.3 pg (ref 26.0–34.0)
MCHC: 33.7 g/dL (ref 30.0–36.0)
MCV: 90 fL (ref 80.0–100.0)
Monocytes Absolute: 0.9 10*3/uL (ref 0.1–1.0)
Monocytes Relative: 7 %
Neutro Abs: 9.7 10*3/uL — ABNORMAL HIGH (ref 1.7–7.7)
Neutrophils Relative %: 77 %
Platelets: 211 10*3/uL (ref 150–400)
RBC: 4.58 MIL/uL (ref 4.22–5.81)
RDW: 13.6 % (ref 11.5–15.5)
WBC: 12.7 10*3/uL — ABNORMAL HIGH (ref 4.0–10.5)
nRBC: 0 % (ref 0.0–0.2)

## 2022-08-13 LAB — COMPREHENSIVE METABOLIC PANEL
ALT: 13 U/L (ref 0–44)
AST: 19 U/L (ref 15–41)
Albumin: 4.4 g/dL (ref 3.5–5.0)
Alkaline Phosphatase: 64 U/L (ref 38–126)
Anion gap: 10 (ref 5–15)
BUN: 10 mg/dL (ref 6–20)
CO2: 25 mmol/L (ref 22–32)
Calcium: 9.2 mg/dL (ref 8.9–10.3)
Chloride: 102 mmol/L (ref 98–111)
Creatinine, Ser: 1.07 mg/dL (ref 0.61–1.24)
GFR, Estimated: >60 mL/min (ref >60)
Glucose, Bld: 111 mg/dL — ABNORMAL HIGH (ref 70–99)
Potassium: 3.8 mmol/L (ref 3.5–5.1)
Sodium: 137 mmol/L (ref 135–145)
Total Bilirubin: 1.2 mg/dL (ref 0.3–1.2)
Total Protein: 8.1 g/dL (ref 6.5–8.1)

## 2022-08-13 LAB — TYPE AND SCREEN
ABO/RH(D): O NEG
Antibody Screen: NEGATIVE

## 2022-08-13 MED ORDER — KETOROLAC TROMETHAMINE 15 MG/ML IJ SOLN
30.0000 mg | Freq: Once | INTRAMUSCULAR | Status: AC
  Administered 2022-08-13: 30 mg via INTRAVENOUS
  Filled 2022-08-13: qty 2

## 2022-08-13 MED ORDER — OXYCODONE-ACETAMINOPHEN 5-325 MG PO TABS
2.0000 | ORAL_TABLET | Freq: Once | ORAL | Status: DC
  Filled 2022-08-13: qty 2

## 2022-08-13 MED ORDER — METHOCARBAMOL 1000 MG/10ML IJ SOLN
1000.0000 mg | Freq: Once | INTRAVENOUS | Status: AC
  Administered 2022-08-13: 1000 mg via INTRAVENOUS
  Filled 2022-08-13: qty 10

## 2022-08-13 MED ORDER — ONDANSETRON HCL 4 MG/2ML IJ SOLN: 4.0000 mg | Freq: Once | INTRAMUSCULAR | Status: DC

## 2022-08-13 MED ORDER — HYDROMORPHONE HCL 1 MG/ML IJ SOLN
1.0000 mg | Freq: Once | INTRAMUSCULAR | Status: AC
  Administered 2022-08-13: 1 mg via INTRAVENOUS
  Filled 2022-08-13: qty 1

## 2022-08-13 MED ORDER — ACETAMINOPHEN 500 MG PO TABS
1000.0000 mg | ORAL_TABLET | Freq: Once | ORAL | Status: AC
  Administered 2022-08-13: 1000 mg via ORAL
  Filled 2022-08-13: qty 2

## 2022-08-13 MED ORDER — METHOCARBAMOL 750 MG PO TABS: 750.0000 mg | ORAL_TABLET | Freq: Four times a day (QID) | ORAL | 0 refills | Status: DC | PRN

## 2022-08-13 MED ORDER — IBUPROFEN 600 MG PO TABS: 600.0000 mg | ORAL_TABLET | Freq: Four times a day (QID) | ORAL | 0 refills | Status: DC | PRN

## 2022-08-13 MED ORDER — SODIUM CHLORIDE 0.9 % IV BOLUS
1000.0000 mL | Freq: Once | INTRAVENOUS | Status: AC
  Administered 2022-08-13: 1000 mL via INTRAVENOUS

## 2022-08-13 MED ORDER — HYDROMORPHONE HCL 1 MG/ML IJ SOLN
0.5000 mg | Freq: Once | INTRAMUSCULAR | Status: AC
  Administered 2022-08-13: 0.5 mg via INTRAVENOUS
  Filled 2022-08-13: qty 0.5

## 2022-08-13 MED ORDER — OXYCODONE HCL 5 MG PO TABS
10.0000 mg | ORAL_TABLET | Freq: Once | ORAL | Status: AC
  Administered 2022-08-13: 10 mg via ORAL
  Filled 2022-08-13: qty 2

## 2022-08-13 MED ORDER — HYDROMORPHONE HCL 1 MG/ML IJ SOLN: 1.0000 mg | Freq: Once | INTRAMUSCULAR | Status: DC

## 2022-08-13 MED ORDER — ACETAMINOPHEN 500 MG PO TABS: 1000.0000 mg | ORAL_TABLET | Freq: Four times a day (QID) | ORAL | 0 refills | Status: AC | PRN

## 2022-08-13 NOTE — ED Notes (Signed)
I walked into the patients room and greeted him and told him I had his discharge papers and the doctor wanted me to make sure he understood to not eat or drink anything and to go straight to the [facility] ER.  When I told pt I was going to remove his IV he said the doctor said he could leave it in and I told him per protocol I can't do that because he is being discharged.  He told me to go get the doctor because he wasn't getting stuck again.  I said OK and went and verified Dr. Roderic Palau wanted to leave the IV in place at discharge and when I returned to the room to tell the pt we would leave the IV in place pt became instantly hostile and verbally loud and aggressive and stated, "I'm not leaving, you go get that doctor's ass back in here and have the ambulance take me.  I haven't liked your attitude since you first walked in the door, you are very rude and you like that word I can't."  I did not respond to the patient and left the room because he was continuing to verbally escalate and let the charge nurse know about the situation.

## 2022-08-13 NOTE — ED Provider Notes (Signed)
[facility] Provider Note   CSN: [id number] Arrival date & time: 08/13/22  6962     History  Chief Complaint  Patient presents with   Rectal Bleeding    TAELOR MONCADA is a 32 y.o. male.  Patient had rectal fistula surgery done by Dr. Johney Maine yesterday.  Patient complains of significant bleeding and pain.  He has changed his diaper a couple times because of the blood.  Patient has a history of being HIV positive  The history is provided by the patient and medical records. No language interpreter was used.  Rectal Bleeding Quality:  Bright red Amount:  Moderate Timing:  Constant Chronicity:  New Context: anal fissures   Similar prior episodes: no   Relieved by:  Nothing Worsened by:  Nothing Associated symptoms: no abdominal pain        Home Medications Prior to Admission medications   Medication Sig Start Date End Date Taking? Authorizing Provider  cabotegravir & rilpivirine ER (CABENUVA) 600 & 900 MG/3ML injection Inject 1 kit into the muscle every 30 (thirty) days. 03/16/22  Yes Esmond Plants, RPH-CPP  dextromethorphan-guaiFENesin (MUCINEX DM) 30-600 MG 12hr tablet Take 1 tablet by mouth 2 (two) times daily as needed for cough (20). 01/31/22  Yes Isla Pence, MD  diazepam (VALIUM) 5 MG tablet Take 5 mg by mouth every 8 (eight) hours as needed for anxiety.   Yes [provider]  docusate sodium (COLACE) 100 MG capsule Take 1 capsule (100 mg total) by mouth 2 (two) times daily. 05/11/22  Yes Alvira Monday, FNP  fluticasone (FLONASE) 50 MCG/ACT nasal spray Place 1 spray into both nostrils daily. Patient taking differently: Place 1 spray into both nostrils daily as needed for allergies. 01/31/22  Yes Isla Pence, MD  ibuprofen (ADVIL) 600 MG tablet Take 1 tablet (600 mg total) by mouth every 6 (six) hours as needed. 01/31/22  Yes Isla Pence, MD  NONFORMULARY OR COMPOUNDED ITEM nitroglycerin/lidocaine rectal  ointment   Yes [provider]  omeprazole (PRILOSEC) 40 MG capsule Take 1 capsule (40 mg total) by mouth daily. 03/11/22  Yes Eloise Harman, DO  oxyCODONE (OXY IR/ROXICODONE) 5 MG immediate release tablet Take 5 mg by mouth every 6 (six) hours as needed for severe pain.   Yes [provider]  pantoprazole (PROTONIX) 40 MG tablet Take 40 mg by mouth daily. 07/31/22  Yes [provider]      Allergies    Zofran Alvis Lemmings hcl]    Review of Systems   Review of Systems  Constitutional:  Negative for appetite change and fatigue.  HENT:  Negative for congestion, ear discharge and sinus pressure.   Eyes:  Negative for discharge.  Respiratory:  Negative for cough.   Cardiovascular:  Negative for chest pain.  Gastrointestinal:  Positive for hematochezia. Negative for abdominal pain and diarrhea.       Rectal bleeding  Genitourinary:  Negative for frequency and hematuria.  Musculoskeletal:  Negative for back pain.  Skin:  Negative for rash.  Neurological:  Negative for seizures and headaches.  Psychiatric/Behavioral:  Negative for hallucinations.     Physical Exam Updated Vital Signs BP 135/82 (BP Location: Right Arm)   Pulse 85   Temp 98.3 F (36.8 C) (Oral)   Resp 16   Ht 5\' 9"  (1.753 m)   Wt 74.2 kg   SpO2 98%   BMI 24.14 kg/m  Physical Exam Vitals and nursing note reviewed.  Constitutional:  Appearance: He is well-developed.  HENT:     Head: Normocephalic.     Mouth/Throat:     Mouth: Mucous membranes are moist.  Eyes:     General: No scleral icterus.    Conjunctiva/sclera: Conjunctivae normal.  Neck:     Thyroid: No thyromegaly.  Cardiovascular:     Rate and Rhythm: Normal rate and regular rhythm.     Heart sounds: No murmur heard.    No friction rub. No gallop.  Pulmonary:     Breath sounds: No stridor. No wheezing or rales.  Chest:     Chest wall: No tenderness.  Abdominal:     General: There is no distension.     Tenderness:  There is no abdominal tenderness. There is no rebound.  Genitourinary:    Comments: Patient has moderate amount of blood in his diaper and around his buttocks.  I was never able to get a good look at his anus because of the blood clots and the pain he was having. Musculoskeletal:        General: Normal range of motion.     Cervical back: Neck supple.  Lymphadenopathy:     Cervical: No cervical adenopathy.  Skin:    Findings: No erythema or rash.  Neurological:     Mental Status: He is alert and oriented to person, place, and time.     Motor: No abnormal muscle tone.     Coordination: Coordination normal.  Psychiatric:        Behavior: Behavior normal.     ED Results / Procedures / Treatments   Labs (all labs ordered are listed, but only abnormal results are displayed) Labs Reviewed  CBC WITH DIFFERENTIAL/PLATELET - Abnormal; Notable for the following components:      Result Value   WBC 12.7 (*)    Neutro Abs 9.7 (*)    All other components within normal limits  COMPREHENSIVE METABOLIC PANEL - Abnormal; Notable for the following components:   Glucose, Bld 111 (*)    All other components within normal limits  TYPE AND SCREEN    EKG None  Radiology No results found.  Procedures Procedures    Medications Ordered in ED Medications  ondansetron (ZOFRAN) injection 4 mg (4 mg Intravenous Not Given 08/13/22 0904)  sodium chloride 0.9 % bolus 1,000 mL (0 mLs Intravenous Stopped 08/13/22 1011)  HYDROmorphone (DILAUDID) injection 0.5 mg (0.5 mg Intravenous Given 08/13/22 0904)  HYDROmorphone (DILAUDID) injection 1 mg (1 mg Intravenous Given 08/13/22 1035)    ED Course/ Medical Decision Making/ A&P  CRITICAL CARE Performed by: Milton Ferguson Total critical care time: 45 minutes Critical care time was exclusive of separately billable procedures and treating other patients. Critical care was necessary to treat or prevent imminent or life-threatening deterioration. Critical care  was time spent personally by me on the following activities: development of treatment plan with patient and/or surrogate as well as nursing, discussions with consultants, evaluation of patient's response to treatment, examination of patient, obtaining history from patient or surrogate, ordering and performing treatments and interventions, ordering and review of laboratory studies, ordering and review of radiographic studies, pulse oximetry and re-evaluation of patient's condition.                            Medical Decision Making Amount and/or Complexity of Data Reviewed Labs: ordered.  Risk OTC drugs. Prescription drug management.  This patient presents to the ED for concern of rectal  bleeding, this involves an extensive number of treatment options, and is a complaint that carries with it a high risk of complications and morbidity.  The differential diagnosis includes bleeding postop   Co morbidities that complicate the patient evaluation  HIV   Additional history obtained:  Additional history obtained from patient External records from outside source obtained and reviewed including hospital records   Lab Tests:  I Ordered, and personally interpreted labs.  The pertinent results include: Hemoglobin 13.9   Imaging Studies ordered: No imaging Cardiac Monitoring: / EKG:  The patient was maintained on a cardiac monitor.  I personally viewed and interpreted the cardiac monitored which showed an underlying rhythm of: Normal sinus rhythm   Consultations Obtained:  I requested consultation with the general surgery,  and discussed lab and imaging findings as well as pertinent plan - they recommend: Consult   Problem List / ED Course / Critical interventions / Medication management  Rectal bleeding No medications Reevaluation of the patient after these medicines showed that the patient stayed the same I have reviewed the patients home medicines and have made adjustments as  needed   Social Determinants of Health:  None   Test / Admission - Considered:  None  Patient with rectal bleeding and significant anal pain after rectal surgery.  I spoke with Dr.Lovick and she suggested having the patient come to the emergency department at [facility] and general surgery will see him.        Final Clinical Impression(s) / ED Diagnoses Final diagnoses:  Rectal bleeding    Rx / DC Orders ED Discharge Orders     None         Bethann Berkshire, MD 08/16/22 1214

## 2022-08-13 NOTE — Consult Note (Signed)
Patient seen and examined. Expected post-op bleeding after rectal surgery. Labs appropriate for post-op state. Recommend adjustment in pain control regimen to include tylenol scheduled 1g q6h, robaxin scheduled 750mg  q6h, and ibuprofen 600mg  q6h scheduled, alternating each med q2h. Also recommend miralax for constipation. Will ensure availability for urgent office appointment early next week if continued symptoms.   Jesusita Oka, MD General and [facility]

## 2022-08-13 NOTE — ED Triage Notes (Signed)
Patient c/o rectal bleeding that started this morning after changing dressing from surgical site. Per patient had anal fistula reconstruction surgery yesterday. Patient states heavy bleeding with clots x2 hours.

## 2022-08-13 NOTE — ED Notes (Signed)
Patient discharged with IV access intact per Dr Ellsworth Lennox order/instruction. Patient to go directly to [facility] upon discharge, not to drink or eat. Patient verbalized understanding.

## 2022-08-13 NOTE — ED Provider Notes (Signed)
  Physical Exam  BP (!) 143/83   Pulse 88   Temp 98.3 F (36.8 C) (Oral)   Resp 15   Ht 5\' 9"  (1.753 m)   Wt 74.2 kg   SpO2 100%   BMI 24.14 kg/m   Physical Exam  Procedures  Procedures  ED Course / MDM    Medical Decision Making Amount and/or Complexity of Data Reviewed Labs: ordered.  Risk OTC drugs. Prescription drug management.   Transferred from [facility].  Recent fistula surgery yesterday.  Increasing bleeding and pain.  Transfer to see general surgery.  Dr. Bobbye Morton is seen patient.  Given IV medicines here to help.  Patient already feeling much better.  Given protocol for home.  Will give prescriptions as needed.  Should be able to discharge home.    Patient feeling better and discharged.     Davonna Belling, MD 08/13/22 2328

## 2022-08-18 ENCOUNTER — Encounter: Payer: Self-pay | Admitting: Surgery

## 2022-08-22 ENCOUNTER — Telehealth: Payer: Self-pay

## 2022-08-22 NOTE — Telephone Encounter (Signed)
Patient called stating that he tested positive for COVID on 08/20/2022. Patient has sore throat, headache, congestion, chills. Patient wanted to see if provider recommended any medications for him to take.    [place], CMA

## 2022-08-24 ENCOUNTER — Telehealth: Payer: Self-pay | Admitting: Family Medicine

## 2022-08-24 NOTE — Telephone Encounter (Signed)
Gary Barrett from urologist called needs more detailed information, return call [phone].

## 2022-08-24 NOTE — Telephone Encounter (Signed)
Called back, they have received the paper work they were asking for.

## 2022-08-25 ENCOUNTER — Other Ambulatory Visit: Payer: Self-pay

## 2022-08-25 DIAGNOSIS — Z419 Encounter for procedure for purposes other than remedying health state, unspecified: Secondary | ICD-10-CM | POA: Diagnosis not present

## 2022-08-25 DIAGNOSIS — R32 Unspecified urinary incontinence: Secondary | ICD-10-CM | POA: Diagnosis not present

## 2022-09-06 ENCOUNTER — Telehealth: Payer: Self-pay

## 2022-09-06 NOTE — Telephone Encounter (Signed)
Patient called wanting to know if he should still keep appointment tomorrow for HPV vaccine. States his pathology report came back concerning for HPV.   We discussed that Gardasil helps to protect against 9 different strains and that it would probably still be beneficial to get vaccinated depending on what strain of HPV he has.   He will keep appointment tomorrow to discuss further and to receive Menveo vaccine.   Beryle Flock, RN

## 2022-09-06 NOTE — Telephone Encounter (Signed)
Thank you for the FYI - I was not able to see the report either but agree with your comments. Estill Bamberg

## 2022-09-07 ENCOUNTER — Encounter: Payer: BC Managed Care – PPO | Admitting: Pharmacist

## 2022-09-11 ENCOUNTER — Other Ambulatory Visit: Payer: Self-pay | Admitting: Family Medicine

## 2022-09-12 ENCOUNTER — Other Ambulatory Visit: Payer: Self-pay | Admitting: Family Medicine

## 2022-09-14 ENCOUNTER — Encounter: Payer: BC Managed Care – PPO | Admitting: Pharmacist

## 2022-09-23 DIAGNOSIS — Z419 Encounter for procedure for purposes other than remedying health state, unspecified: Secondary | ICD-10-CM | POA: Diagnosis not present

## 2022-10-10 ENCOUNTER — Encounter: Payer: Self-pay | Admitting: Pharmacist

## 2022-10-10 ENCOUNTER — Other Ambulatory Visit (HOSPITAL_COMMUNITY): Payer: Self-pay

## 2022-10-10 MED ORDER — CABOTEGRAVIR & RILPIVIRINE ER 600 & 900 MG/3ML IM SUER
1.0000 | INTRAMUSCULAR | 5 refills | Status: DC
  Filled 2022-10-10: qty 6, 60d supply, fill #0
  Filled 2022-10-11: qty 6, 56d supply, fill #0
  Filled 2022-12-01: qty 6, 56d supply, fill #1

## 2022-10-11 ENCOUNTER — Other Ambulatory Visit: Payer: Self-pay

## 2022-10-11 ENCOUNTER — Telehealth: Payer: Self-pay

## 2022-10-11 ENCOUNTER — Encounter: Payer: Medicaid Other | Admitting: Family

## 2022-10-11 ENCOUNTER — Other Ambulatory Visit (HOSPITAL_COMMUNITY): Payer: Self-pay

## 2022-10-11 NOTE — Telephone Encounter (Signed)
[facility] Patient Teacher, English as a foreign language completed.    The patient is insured through [facility] [employer] and has a $0.00 copay.  Patient have Rx [employer] [place] [employer]. I was made aware on 10/10/22, by doing an eligibility look up.  Prescription was sent to [facility] to be couriered to [facility].  We will continue to follow to see if copay assistance is needed.  Ileene Patrick, [facility] Patient [facility] for Infectious Disease Phone: [phone] Fax:  [phone]

## 2022-10-13 ENCOUNTER — Other Ambulatory Visit: Payer: Self-pay

## 2022-10-13 ENCOUNTER — Ambulatory Visit (INDEPENDENT_AMBULATORY_CARE_PROVIDER_SITE_OTHER): Payer: 59 | Admitting: Family

## 2022-10-13 ENCOUNTER — Encounter: Payer: Self-pay | Admitting: Family

## 2022-10-13 ENCOUNTER — Telehealth: Payer: Self-pay

## 2022-10-13 VITALS — BP 116/73 | HR 77 | Temp 97.7°F | Wt 170.0 lb

## 2022-10-13 DIAGNOSIS — Z0000 Encounter for general adult medical examination without abnormal findings: Secondary | ICD-10-CM

## 2022-10-13 DIAGNOSIS — R569 Unspecified convulsions: Secondary | ICD-10-CM | POA: Diagnosis not present

## 2022-10-13 DIAGNOSIS — I10 Essential (primary) hypertension: Secondary | ICD-10-CM | POA: Diagnosis not present

## 2022-10-13 DIAGNOSIS — B20 Human immunodeficiency virus [HIV] disease: Secondary | ICD-10-CM

## 2022-10-13 DIAGNOSIS — R55 Syncope and collapse: Secondary | ICD-10-CM | POA: Diagnosis not present

## 2022-10-13 MED ORDER — CABOTEGRAVIR & RILPIVIRINE ER 600 & 900 MG/3ML IM SUER
1.0000 | Freq: Once | INTRAMUSCULAR | Status: AC
  Administered 2022-10-13: 1 via INTRAMUSCULAR

## 2022-10-13 NOTE — Progress Notes (Signed)
Brief Narrative   Patient ID: Gary Barrett, male    DOB: 1990-10-11, 32 y.o.   MRN: [id number]  Mr. 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 Initial viral load was 40,500 with CD4 count of 199. Genotype with no significant medication resistant mutations. No history of opportunistic infection. [id number] negative. Entered care at [facility] Stage 3. Had nausea with multiple regimens including Biktarvy, Dovato, Delstrigo, [place]ent[place]. Now on Cabenuva.   Subjective:    Chief Complaint  Patient presents with   Follow-up    Cabenuva    HPI:  Gary Barrett is a 32 y.o. male with HIV disease last seen on 08/10/22 by Gary Barrett, PharmD, CPP with well controlled virus and good adherence and tolerance to [place]. Viral load was undetectable with CD4 count 554. Syphilis titer was down to 1:128 from treatment of 1:2048. Here today for follow up.  Gary Barrett has been doing well and continues to tolerate Cabenvua with no adverse side effects.No new concerns/complaints.  Denies fevers, chills, night sweats, headaches, changes in vision, neck pain/stiffness, nausea, diarrhea, vomiting, lesions or rashes.     Allergies  Allergen Reactions   Zofran [Ondansetron Hcl] Other (See Comments)    Constipation. Pt reports that infection disease physician instructed him to d/c.      Outpatient Medications Prior to Visit  Medication Sig Dispense Refill   acetaminophen (TYLENOL) 500 MG tablet Take 2 tablets (1,000 mg total) by mouth every 6 (six) hours as needed. 120 tablet 0   cabotegravir & rilpivirine ER (CABENUVA) 600 & 900 MG/3ML injection Inject 1 kit into the muscle every 2 (two) months. 6 mL 5   docusate sodium (COLACE) 100 MG capsule Take 1 capsule (100 mg total) by mouth 2 (two) times daily. 10 capsule 2   ibuprofen (ADVIL) 600 MG tablet Take 1 tablet (600 mg total) by mouth every 6 (six) hours as needed. 120 tablet 0   methocarbamol  (ROBAXIN) 750 MG tablet Take 1 tablet (750 mg total) by mouth every 6 (six) hours as needed for muscle spasms. 120 tablet 0   pantoprazole (PROTONIX) 40 MG tablet Take 40 mg by mouth daily.     cabotegravir & rilpivirine ER (CABENUVA) 600 & 900 MG/3ML injection Inject 1 kit into the muscle every 30 (thirty) days. (Patient not taking: Reported on 10/13/2022) 6 mL 1   dextromethorphan-guaiFENesin (MUCINEX DM) 30-600 MG 12hr tablet Take 1 tablet by mouth 2 (two) times daily as needed for cough (20). (Patient not taking: Reported on 10/13/2022) 10 tablet 0   diazepam (VALIUM) 5 MG tablet Take 5 mg by mouth every 8 (eight) hours as needed for anxiety. (Patient not taking: Reported on 10/13/2022)     fluticasone (FLONASE) 50 MCG/ACT nasal spray Place 1 spray into both nostrils daily. (Patient not taking: Reported on 10/13/2022) 11.1 mL 2   NONFORMULARY OR COMPOUNDED ITEM Place 1 application  rectally daily as needed (pain).  nitroglycerin/lidocaine rectal ointment (Patient not taking: Reported on 10/13/2022)     omeprazole (PRILOSEC) 40 MG capsule Take 1 capsule (40 mg total) by mouth daily. (Patient not taking: Reported on 10/13/2022) 90 capsule 3   oxyCODONE (OXY IR/ROXICODONE) 5 MG immediate release tablet Take 5 mg by mouth every 6 (six) hours as needed for severe pain. (Patient not taking: Reported on 10/13/2022)     No facility-administered medications prior to visit.     Past Medical History:  Diagnosis Date  HIV (human immunodeficiency virus infection) [facility])      Past Surgical History:  Procedure Laterality Date   ANAL FISTULOTOMY     BIOPSY  02/08/2022   Procedure: BIOPSY;  Surgeon: Eloise Harman, DO;  Location: [facility];  Service: Endoscopy;;   ESOPHAGOGASTRODUODENOSCOPY (EGD) WITH PROPOFOL N/A 02/08/2022   Procedure: ESOPHAGOGASTRODUODENOSCOPY (EGD) WITH PROPOFOL;  Surgeon: Eloise Harman, DO;  Location: [facility];  Service: Endoscopy;  Laterality: N/A;  1:00pm   INNER EAR  SURGERY        Review of Systems  Constitutional:  Negative for appetite change, chills, fatigue, fever and unexpected weight change.  Eyes:  Negative for visual disturbance.  Respiratory:  Negative for cough, chest tightness, shortness of breath and wheezing.   Cardiovascular:  Negative for chest pain and leg swelling.  Gastrointestinal:  Negative for abdominal pain, constipation, diarrhea, nausea and vomiting.  Genitourinary:  Negative for dysuria, flank pain, frequency, genital sores, hematuria and urgency.  Skin:  Negative for rash.  Allergic/Immunologic: Negative for immunocompromised state.  Neurological:  Negative for dizziness and headaches.      Objective:    BP 116/73   Pulse 77   Temp 97.7 F (36.5 C) (Rectal)   Wt 170 lb (77.1 kg)   SpO2 98%   BMI 25.10 kg/m  Nursing note and vital signs reviewed.  Physical Exam Constitutional:      General: He is not in acute distress.    Appearance: He is well-developed.  Eyes:     Conjunctiva/sclera: Conjunctivae normal.  Cardiovascular:     Rate and Rhythm: Normal rate and regular rhythm.     Heart sounds: Normal heart sounds. No murmur heard.    No friction rub. No gallop.  Pulmonary:     Effort: Pulmonary effort is normal. No respiratory distress.     Breath sounds: Normal breath sounds. No wheezing or rales.  Chest:     Chest wall: No tenderness.  Abdominal:     General: Bowel sounds are normal.     Palpations: Abdomen is soft.     Tenderness: There is no abdominal tenderness.  Musculoskeletal:     Cervical back: Neck supple.  Lymphadenopathy:     Cervical: No cervical adenopathy.  Skin:    General: Skin is warm and dry.     Findings: No rash.  Neurological:     Mental Status: He is alert and oriented to person, place, and time.  Psychiatric:        Behavior: Behavior normal.        Thought Content: Thought content normal.        Judgment: Judgment normal.         10/13/2022    8:58 AM 07/15/2022     9:28 AM 04/15/2022    9:29 AM 03/07/2022    2:04 PM 02/01/2022   10:16 AM  Depression screen PHQ 2/9  Decreased Interest 0 2 0 0 0  Down, Depressed, Hopeless 0 1 0 0 0  PHQ - 2 Score 0 3 0 0 0  Altered sleeping  1     Tired, decreased energy  1     Change in appetite  2     Feeling bad or failure about yourself   2     Trouble concentrating  1     Moving slowly or fidgety/restless  1     Suicidal thoughts  1     PHQ-9 Score  12  Difficult doing work/chores  Somewhat difficult          Assessment & Plan:    Patient Active Problem List   Diagnosis Date Noted   Need for RSV vaccination 07/15/2022   Syphilis 06/20/2022   Family history of colon cancer 05/02/2022   Anal pain 05/02/2022   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 Shia continues to have well controlled virus with good adherence and tolerance to [place]. Reviewed previous lab work and discussed plan of care. Cabenuva injection provided without complication. Check lab work at next office visit. Follow up with pharmacy provider in 2 months and with me in 4 months or sooner if needed.       Healthcare maintenance    Discussed importance of safe sexual practice and condom use. Condoms and STD testing offered.         I am having Mikyle Hoskinson. Riehl maintain his dextromethorphan-guaiFENesin, fluticasone, omeprazole, cabotegravir & rilpivirine ER, NONFORMULARY OR COMPOUNDED ITEM, docusate sodium, diazepam, oxyCODONE, pantoprazole, acetaminophen,  methocarbamol, ibuprofen, and cabotegravir & rilpivirine ER. We administered cabotegravir & rilpivirine ER.   Meds ordered this encounter  Medications   cabotegravir & rilpivirine ER (CABENUVA) 600 & 900 MG/3ML injection 1 kit     Follow-up: Return in about 2 months (around 12/13/2022).   Terri Piedra, MSN, FNP-C Nurse Practitioner [facility] for [facility] Disease [place] number: [phone]

## 2022-10-13 NOTE — Assessment & Plan Note (Signed)
Gary Barrett continues to have well controlled virus with good adherence and tolerance to [place]. Reviewed previous lab work and discussed plan of care. Cabenuva injection provided without complication. Check lab work at next office visit. Follow up with pharmacy provider in 2 months and with me in 4 months or sooner if needed.

## 2022-10-13 NOTE — Telephone Encounter (Signed)
[facility] Patient Advocate Encounter  Patient's medication Kern Reap) have been couriered to [facility] from [employer] and will be administered on the patient next office visit on 10/13/22.  Ileene Patrick , [facility] Patient [facility] for [facility]ious Disease Phone: [phone] Fax:  [phone]

## 2022-10-13 NOTE — Patient Instructions (Addendum)
Nice to see you.  Plan for follow up in 2 months of sooner if needed with Estill Bamberg as scheduled and with Marya Amsler in 4 months.   Have a great day and stay safe!

## 2022-10-13 NOTE — Assessment & Plan Note (Signed)
Discussed importance of safe sexual practice and condom use. Condoms and STD testing offered.

## 2022-10-17 ENCOUNTER — Ambulatory Visit (INDEPENDENT_AMBULATORY_CARE_PROVIDER_SITE_OTHER): Payer: 59 | Admitting: Family Medicine

## 2022-10-17 ENCOUNTER — Encounter: Payer: Self-pay | Admitting: Family Medicine

## 2022-10-17 VITALS — BP 135/71 | HR 79 | Ht 68.0 in | Wt 172.1 lb

## 2022-10-17 DIAGNOSIS — R7301 Impaired fasting glucose: Secondary | ICD-10-CM

## 2022-10-17 DIAGNOSIS — E559 Vitamin D deficiency, unspecified: Secondary | ICD-10-CM

## 2022-10-17 DIAGNOSIS — E0789 Other specified disorders of thyroid: Secondary | ICD-10-CM | POA: Diagnosis not present

## 2022-10-17 DIAGNOSIS — Z0001 Encounter for general adult medical examination with abnormal findings: Secondary | ICD-10-CM

## 2022-10-17 DIAGNOSIS — E7849 Other hyperlipidemia: Secondary | ICD-10-CM

## 2022-10-17 DIAGNOSIS — R350 Frequency of micturition: Secondary | ICD-10-CM | POA: Diagnosis not present

## 2022-10-17 DIAGNOSIS — E038 Other specified hypothyroidism: Secondary | ICD-10-CM

## 2022-10-17 LAB — POCT URINALYSIS DIPSTICK
Bilirubin, UA: NEGATIVE
Blood, UA: NEGATIVE
Glucose, UA: NEGATIVE
Leukocytes, UA: NEGATIVE
Nitrite, UA: NEGATIVE
Protein, UA: POSITIVE — AB
Spec Grav, UA: >=1.03 — AB (ref 1.010–1.025)
Urobilinogen, UA: 0.2 E.U./dL
pH, UA: 5.5 (ref 5.0–8.0)

## 2022-10-17 NOTE — Assessment & Plan Note (Signed)
Onset of symptoms a week ago Reports urgency and frequency of urination more than usual Denies weak urinary stream and sense of incomplete bladder emptying, Of note, the patient reports increased consumption of caffeine Denies dysuria, back pain, fever, chills, and abdominal pain UA negative for leukocytes and nitrates Excessive urination likely due to increased consumption of caffeine Will continue to monitor

## 2022-10-17 NOTE — Progress Notes (Signed)
Complete physical exam  Patient: Gary Barrett   DOB: [birth year]   32 y.o. Male  MRN: [id number]  Subjective:    Chief Complaint  Patient presents with   Annual Exam    Cpe today.    Gary Barrett is a 32 y.o. male who presents today for a complete physical exam. He reports consuming a general diet.  Walks 15 minutes daily with his dogs.  He generally feels well. He reports sleeping well. He does not have additional problems to discuss today.    Most recent fall risk assessment:    10/17/2022    8:13 AM  Fall Risk   Falls in the past year? 0  Number falls in past yr: 0  Injury with Fall? 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   Need for immunization against influenza 04/15/2022   Elevated BP without diagnosis of hypertension 12/28/2021   Constipation 12/07/2021   Hemorrhoids 11/17/2021   Healthcare maintenance 11/17/2021   HIV disease ([place]) 10/21/2021   Encounter for screening for HIV    Syncope 14-Nov-2021   Diaphoresis November 14, 2021   FH: sudden cardiac death (SCD)    FH: premature coronary heart disease    Past Medical History:  Diagnosis Date   HIV (human immunodeficiency virus infection) ([place])    Past Surgical History:  Procedure Laterality Date   ANAL FISTULOTOMY     BIOPSY  02/08/2022   Procedure: BIOPSY;  Surgeon: Eloise Harman, DO;  [facility];   Service: Endoscopy;;   ESOPHAGOGASTRODUODENOSCOPY (EGD) WITH PROPOFOL N/A 02/08/2022   Procedure: ESOPHAGOGASTRODUODENOSCOPY (EGD) WITH PROPOFOL;  Surgeon: Eloise Harman, DO;  [facility];  Service: Endoscopy;  Laterality: N/A;  1:00pm   INNER EAR SURGERY     Social History   Tobacco Use   Smoking status: Some Days    Packs/day: .1    Types: Cigarettes    Passive exposure: Never   Smokeless tobacco: Never   Tobacco comments:    Smokes about 1 pack per week, cutting back  Vaping Use   Vaping Use: Never used  Substance Use Topics   Alcohol use: Not Currently    Alcohol/week: 1.0 standard drink of alcohol    Types: 1 Glasses of wine per week    Comment: occasional   Drug use: Not Currently    Types: Marijuana    Comment: daily - helps with nausea   Social History   Socioeconomic History   Marital status: Single    Spouse name: Not on file   Number of children: Not on file   Years of education: 16   Highest education level: Not on file  Occupational History   Not on file  Tobacco Use   Smoking status: Some Days  Packs/day: .1    Types: Cigarettes    Passive exposure: Never   Smokeless tobacco: Never   Tobacco comments:    Smokes about 1 pack per week, cutting back  Vaping Use   Vaping Use: Never used  Substance and Sexual Activity   Alcohol use: Not Currently    Alcohol/week: 1.0 standard drink of alcohol    Types: 1 Glasses of wine per week    Comment: occasional   Drug use: Not Currently    Types: Marijuana    Comment: daily - helps with nausea   Sexual activity: Yes    Comment: declined condoms  Other Topics Concern   Not on file  Social History Narrative   Not on file   Social Determinants of Health   Financial Resource Strain: Not on file  Food Insecurity: Not on file  Transportation Needs: Not on file  Physical Activity: Not on file  Stress: Not on file  Social Connections: Not on file  Intimate Partner Violence: Not on file    Family Status  Relation Name Status   Mother  (Not Specified)   MGM  Alive   Cousin  Alive   Family History  Problem Relation Age of Onset   Heart disease Mother    CAD Mother    Colon cancer Maternal Grandmother    Sudden death Cousin    Allergies  Allergen Reactions   Zofran [Ondansetron Hcl] Other (See Comments)    Constipation. Pt reports that infection disease physician instructed him to d/c.      Patient Care Team: Alvira Monday, [place] as PCP - General (Family Medicine) Skeet Latch, MD as PCP - Cardiology (Cardiology) Eppie Gibson as Physician Assistant (Cardiology) Eloise Harman, DO as Consulting Physician (Gastroenterology) Rosiland Oz, MD as Consulting Physician (Infectious Diseases) Golden Circle, FNP as Nurse Practitioner (Infectious Diseases) Michael Boston, MD as Consulting Physician (General Surgery)   Outpatient Medications Prior to Visit  Medication Sig   acetaminophen (TYLENOL) 500 MG tablet Take 2 tablets (1,000 mg total) by mouth every 6 (six) hours as needed.   cabotegravir & rilpivirine ER (CABENUVA) 600 & 900 MG/3ML injection Inject 1 kit into the muscle every 2 (two) months.   docusate sodium (COLACE) 100 MG capsule Take 1 capsule (100 mg total) by mouth 2 (two) times daily.   ibuprofen (ADVIL) 600 MG tablet Take 1 tablet (600 mg total) by mouth every 6 (six) hours as needed.   methocarbamol (ROBAXIN) 750 MG tablet Take 1 tablet (750 mg total) by mouth every 6 (six) hours as needed for muscle spasms.   pantoprazole (PROTONIX) 40 MG tablet Take 40 mg by mouth daily.   cabotegravir & rilpivirine ER (CABENUVA) 600 & 900 MG/3ML injection Inject 1 kit into the muscle every 30 (thirty) days. (Patient not taking: Reported on 10/13/2022)   dextromethorphan-guaiFENesin (MUCINEX DM) 30-600 MG 12hr tablet Take 1 tablet by mouth 2 (two) times daily as needed for cough (20). (Patient not taking: Reported on 10/13/2022)   diazepam  (VALIUM) 5 MG tablet Take 5 mg by mouth every 8 (eight) hours as needed for anxiety. (Patient not taking: Reported on 10/13/2022)   fluticasone (FLONASE) 50 MCG/ACT nasal spray Place 1 spray into both nostrils daily. (Patient not taking: Reported on 10/13/2022)   NONFORMULARY OR COMPOUNDED ITEM Place 1 application  rectally daily as needed (pain).  nitroglycerin/lidocaine rectal ointment (Patient not taking: Reported on 10/13/2022)   omeprazole (PRILOSEC) 40 MG capsule Take 1 capsule (  40 mg total) by mouth daily. (Patient not taking: Reported on 10/17/2022)   oxyCODONE (OXY IR/ROXICODONE) 5 MG immediate release tablet Take 5 mg by mouth every 6 (six) hours as needed for severe pain. (Patient not taking: Reported on 10/13/2022)   No facility-administered medications prior to visit.    Review of Systems  Constitutional:  Negative for chills, fever and malaise/fatigue.  HENT:  Negative for congestion and sinus pain.   Eyes:  Negative for pain, discharge and redness.  Respiratory:  Negative for cough, sputum production and shortness of breath.   Cardiovascular:  Negative for chest pain, palpitations, claudication and leg swelling.  Gastrointestinal:  Negative for diarrhea, heartburn and nausea.  Genitourinary:  Positive for frequency and urgency. Negative for dysuria and flank pain.  Musculoskeletal:  Negative for back pain and joint pain.  Skin:  Negative for itching.  Neurological:  Negative for dizziness, seizures and headaches.  Endo/Heme/Allergies:  Negative for environmental allergies.  Psychiatric/Behavioral:  Negative for memory loss. The patient does not have insomnia.        Objective:    BP 135/71   Pulse 79   Ht 5\' 8"  (1.727 m)   Wt 172 lb 1.3 oz (78.1 kg)   SpO2 96%   BMI 26.16 kg/m  BP Readings from Last 3 Encounters:  10/17/22 135/71  10/13/22 116/73  08/13/22 111/62   Wt Readings from Last 3 Encounters:  10/17/22 172 lb 1.3 oz (78.1 kg)  10/13/22 170 lb (77.1 kg)   08/13/22 163 lb 8 oz (74.2 kg)      Physical Exam HENT:     Head: Normocephalic.     Right Ear: External ear normal.     Left Ear: External ear normal.     Nose: No congestion.     Mouth/Throat:     Mouth: Mucous membranes are moist.  Eyes:     Extraocular Movements: Extraocular movements intact.     Pupils: Pupils are equal, round, and reactive to light.  Cardiovascular:     Rate and Rhythm: Regular rhythm.     Heart sounds: No murmur heard. Pulmonary:     Effort: No respiratory distress.     Breath sounds: Normal breath sounds.  Abdominal:     Tenderness: There is abdominal tenderness. There is no right CVA tenderness or left CVA tenderness.  Musculoskeletal:     Right lower leg: No edema.     Left lower leg: No edema.  Neurological:     Mental Status: He is alert and oriented to person, place, and time.     GCS: GCS eye subscore is 4. GCS verbal subscore is 5. GCS motor subscore is 6.     Cranial Nerves: No facial asymmetry.     Motor: No atrophy.     Coordination: Coordination normal. Finger-Nose-Finger Test normal.     Gait: Gait normal.  Psychiatric:        Judgment: Judgment normal.     Results for orders placed or performed in visit on 10/17/22  POCT urinalysis dipstick  Result Value Ref Range   Color, UA dark yellow    Clarity, UA clear    Glucose, UA Negative Negative   Bilirubin, UA neg    Ketones, UA trace    Spec Grav, UA >=1.030 (A) 1.010 - 1.025   Blood, UA neg    pH, UA 5.5 5.0 - 8.0   Protein, UA Positive (A) Negative   Urobilinogen, UA 0.2 0.2 or 1.0 E.U./dL  Nitrite, UA neg    Leukocytes, UA Negative Negative   Appearance clear    Odor     Last CBC Lab Results  Component Value Date   WBC 12.7 (H) 08/13/2022   HGB 13.9 08/13/2022   HCT 41.2 08/13/2022   MCV 90.0 08/13/2022   MCH 30.3 08/13/2022   RDW 13.6 08/13/2022   PLT 211 99991111   Last metabolic panel Lab Results  Component Value Date   GLUCOSE 111 (H) 08/13/2022   NA  137 08/13/2022   K 3.8 08/13/2022   CL 102 08/13/2022   CO2 25 08/13/2022   BUN 10 08/13/2022   CREATININE 1.07 08/13/2022   GFRNONAA >60 08/13/2022   CALCIUM 9.2 08/13/2022   PROT 8.1 08/13/2022   ALBUMIN 4.4 08/13/2022   BILITOT 1.2 08/13/2022   ALKPHOS 64 08/13/2022   AST 19 08/13/2022   ALT 13 08/13/2022   ANIONGAP 10 08/13/2022   Last lipids Lab Results  Component Value Date   CHOL 167 10/18/2021   HDL 21 (L) 10/18/2021   LDLCALC 121 (H) 10/18/2021   TRIG 127 10/18/2021   CHOLHDL 8.0 10/18/2021   Last hemoglobin A1c Lab Results  Component Value Date   HGBA1C 5.5 02/01/2022   Last thyroid functions Lab Results  Component Value Date   TSH 2.204 10/18/2021   Last vitamin D No results found for: "25OHVITD2", "25OHVITD3", "VD25OH" Last vitamin B12 and Folate No results found for: "VITAMINB12", "FOLATE"      Assessment & Plan:    Routine Health Maintenance and Physical Exam  Immunization History  Administered Date(s) Administered   COVID-19, mRNA, vaccine(Comirnaty)12 years and older 06/20/2022   Influenza,inj,Quad PF,6+ Mos 04/15/2022   Td 09/27/2007   Tdap 12/27/2021    Health Maintenance  Topic Date Due   COVID-19 Vaccine (2 - Pfizer risk series) 07/11/2022   DTaP/Tdap/Td (3 - Td or Tdap) 12/28/2031   INFLUENZA VACCINE  Completed   Hepatitis C Screening  Completed   HIV Screening  Completed   HPV VACCINES  Aged Out    Discussed health benefits of physical activity, and encouraged him to engage in regular exercise appropriate for his age and condition.  Encounter for annual general medical examination with abnormal findings in adult Assessment & Plan: Physical exam as documented Discussed heart-healthy diet  Encouraged to Exercise: If you are able: 30 -60 minutes a day ,4 days a week, or 150 minutes a week. The longer the better. Combine stretch, strength, and aerobic activities Encourage to eat whole Food, Plant Predominant Nutrition is highly  recommended: Eat Plenty of vegetables, Mushrooms, fruits, Legumes, Whole Grains, Nuts, seeds in lieu of processed meats, processed snacks/pastries red meat, poultry, eggs.  Will f/u in 1 year for CPE      Frequency of urination Assessment & Plan: Onset of symptoms a week ago Reports urgency and frequency of urination more than usual Denies weak urinary stream and sense of incomplete bladder emptying, Of note, the patient reports increased consumption of caffeine Denies dysuria, back pain, fever, chills, and abdominal pain UA negative for leukocytes and nitrates Excessive urination likely due to increased consumption of caffeine Will continue to monitor   Orders: -     POCT urinalysis dipstick  Vitamin D deficiency -     VITAMIN D 25 Hydroxy (Vit-D Deficiency, Fractures)  Other specified disorders of thyroid -     CBC with Differential/Platelet -     CMP14+EGFR -     Lipid panel  Other  hyperlipidemia  Impaired fasting blood sugar  IFG (impaired fasting glucose) -     Hemoglobin A1c  Other specified hypothyroidism -     TSH + free T4    Return in about 1 year (around 10/17/2023) for CPE.     Alvira Monday, FNP

## 2022-10-17 NOTE — Progress Notes (Signed)
Complete physical exam  Patient: Gary Barrett   DOB: [birth year]   32 y.o. Male  MRN: [id number]:[id number]  Subjective:    Chief Complaint  Patient presents with   Annual Exam    Cpe today.    Gary Barrett is a 32 y.o. male who presents today for a complete physical exam. He reports consuming a general diet. Home exercise routine includes walking .5 hrs per day. He generally feels well. He reports sleeping well. He reports having new onset urinary urgency and frequency for the past week.    Most recent fall risk assessment:    10/17/2022    8:13 AM  [place] in the past year? 0  Number falls in past yr: 0  Injury with Fall? 0  Risk for fall due to : No Fall Risks  Follow up Falls evaluation completed     Most recent depression screenings:    10/17/2022    8:13 AM 10/13/2022    8:58 AM  PHQ 2/9 Scores  PHQ - 2 Score 0 0  PHQ- 9 Score 0     Dental: No current dental problems and No regular dental care . Patient has found new dentist, awaiting initial visit.   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   Need for immunization against influenza 04/15/2022   Elevated BP without diagnosis of hypertension 12/28/2021   Constipation 12/07/2021   Hemorrhoids 11/17/2021   Healthcare maintenance 11/17/2021   HIV disease ([place]) 10/21/2021   Encounter for screening for HIV    Syncope 10-23-2021   Diaphoresis 10-23-21   FH: sudden cardiac death (SCD)    FH: premature coronary heart disease    Past Medical History:  Diagnosis Date   HIV (human immunodeficiency virus infection) ([place])    Past Surgical History:  Procedure Laterality Date   ANAL FISTULOTOMY     BIOPSY   02/08/2022   Procedure: BIOPSY;  Surgeon: Eloise Harman, DO;  [facility];  Service: Endoscopy;;   ESOPHAGOGASTRODUODENOSCOPY (EGD) WITH PROPOFOL N/A 02/08/2022   Procedure: ESOPHAGOGASTRODUODENOSCOPY (EGD) WITH PROPOFOL;  Surgeon: Eloise Harman, DO;  [facility];  Service: Endoscopy;  Laterality: N/A;  1:00pm   INNER EAR SURGERY     Social History   Tobacco Use   Smoking status: Some Days    Packs/day: .1    Types: Cigarettes    Passive exposure: Never   Smokeless tobacco: Never   Tobacco comments:    Smokes about 1 pack per week, cutting back  Vaping Use   Vaping Use: Never used  Substance Use Topics   Alcohol use: Not Currently    Alcohol/week: 1.0 standard drink of alcohol    Types: 1 Glasses of wine per week    Comment: occasional   Drug use: Not Currently    Types: Marijuana    Comment: daily - helps with nausea   Allergies  Allergen Reactions   Zofran [Ondansetron Hcl] Other (See Comments)    Constipation. Pt reports that infection disease physician instructed him to d/c.      Patient Care Team: Alvira Monday, [place] as PCP - General (Family Medicine) Skeet Latch, MD  as PCP - Cardiology (Cardiology) Darreld Mclean PA-C as Physician Assistant (Cardiology) Eloise Harman, DO as Consulting Physician (Gastroenterology) Rosiland Oz, MD as Consulting Physician (Infectious Diseases) Golden Circle, FNP as Nurse Practitioner (Infectious Diseases) Michael Boston, MD as Consulting Physician (General Surgery)   Outpatient Medications Prior to Visit  Medication Sig   acetaminophen (TYLENOL) 500 MG tablet Take 2 tablets (1,000 mg total) by mouth every 6 (six) hours as needed.   cabotegravir & rilpivirine ER (CABENUVA) 600 & 900 MG/3ML injection Inject 1 kit into the muscle every 2 (two) months.   docusate sodium (COLACE) 100 MG capsule Take 1 capsule (100 mg total) by mouth 2 (two) times daily.   ibuprofen (ADVIL) 600 MG  tablet Take 1 tablet (600 mg total) by mouth every 6 (six) hours as needed.   methocarbamol (ROBAXIN) 750 MG tablet Take 1 tablet (750 mg total) by mouth every 6 (six) hours as needed for muscle spasms.   pantoprazole (PROTONIX) 40 MG tablet Take 40 mg by mouth daily.   cabotegravir & rilpivirine ER (CABENUVA) 600 & 900 MG/3ML injection Inject 1 kit into the muscle every 30 (thirty) days. (Patient not taking: Reported on 10/13/2022)   dextromethorphan-guaiFENesin (MUCINEX DM) 30-600 MG 12hr tablet Take 1 tablet by mouth 2 (two) times daily as needed for cough (20). (Patient not taking: Reported on 10/13/2022)   diazepam (VALIUM) 5 MG tablet Take 5 mg by mouth every 8 (eight) hours as needed for anxiety. (Patient not taking: Reported on 10/13/2022)   fluticasone (FLONASE) 50 MCG/ACT nasal spray Place 1 spray into both nostrils daily. (Patient not taking: Reported on 10/13/2022)   NONFORMULARY OR COMPOUNDED ITEM Place 1 application  rectally daily as needed (pain).  nitroglycerin/lidocaine rectal ointment (Patient not taking: Reported on 10/13/2022)   omeprazole (PRILOSEC) 40 MG capsule Take 1 capsule (40 mg total) by mouth daily. (Patient not taking: Reported on 10/17/2022)   oxyCODONE (OXY IR/ROXICODONE) 5 MG immediate release tablet Take 5 mg by mouth every 6 (six) hours as needed for severe pain. (Patient not taking: Reported on 10/13/2022)   No facility-administered medications prior to visit.    Review of Systems  Constitutional: Negative.   HENT: Negative.    Eyes: Negative.   Cardiovascular: Negative.   Gastrointestinal:  Positive for nausea.  Genitourinary:  Positive for frequency and urgency.  Musculoskeletal: Negative.   Skin: Negative.   Neurological: Negative.   Endo/Heme/Allergies: Negative.   Psychiatric/Behavioral: Negative.            Objective:     BP 135/71   Pulse 79   Ht 5\' 8"  (1.727 m)   Wt 172 lb 1.3 oz (78.1 kg)   SpO2 96%   BMI 26.16 kg/m  BP Readings from  Last 3 Encounters:  10/17/22 135/71  10/13/22 116/73  08/13/22 111/62   Wt Readings from Last 3 Encounters:  10/17/22 172 lb 1.3 oz (78.1 kg)  10/13/22 170 lb (77.1 kg)  08/13/22 163 lb 8 oz (74.2 kg)      Physical Exam Constitutional:      Appearance: Normal appearance.  HENT:     Head: Normocephalic.     Nose: Nose normal.     Mouth/Throat:     Mouth: Mucous membranes are moist.     Pharynx: Oropharynx is clear.  Eyes:     Pupils: Pupils are equal, round, and reactive to light.  Cardiovascular:     Rate and Rhythm: Normal rate and regular rhythm.  Pulmonary:     Breath sounds: Normal breath sounds.  Abdominal:     Palpations: Abdomen is soft.  Musculoskeletal:        General: Normal range of motion.     Cervical back: Normal range of motion.  Skin:    General: Skin is warm and dry.  Neurological:     General: No focal deficit present.     Mental Status: He is alert.  Psychiatric:        Mood and Affect: Mood normal.        Behavior: Behavior normal.      No results found for any visits on 10/17/22. Last CBC Lab Results  Component Value Date   WBC 12.7 (H) 08/13/2022   HGB 13.9 08/13/2022   HCT 41.2 08/13/2022   MCV 90.0 08/13/2022   MCH 30.3 08/13/2022   RDW 13.6 08/13/2022   PLT 211 99991111   Last metabolic panel Lab Results  Component Value Date   GLUCOSE 111 (H) 08/13/2022   NA 137 08/13/2022   K 3.8 08/13/2022   CL 102 08/13/2022   CO2 25 08/13/2022   BUN 10 08/13/2022   CREATININE 1.07 08/13/2022   GFRNONAA >60 08/13/2022   CALCIUM 9.2 08/13/2022   PROT 8.1 08/13/2022   ALBUMIN 4.4 08/13/2022   BILITOT 1.2 08/13/2022   ALKPHOS 64 08/13/2022   AST 19 08/13/2022   ALT 13 08/13/2022   ANIONGAP 10 08/13/2022   Last lipids Lab Results  Component Value Date   CHOL 167 10/18/2021   HDL 21 (L) 10/18/2021   LDLCALC 121 (H) 10/18/2021   TRIG 127 10/18/2021   CHOLHDL 8.0 10/18/2021   Last hemoglobin A1c Lab Results  Component Value  Date   HGBA1C 5.5 02/01/2022   Last thyroid functions Lab Results  Component Value Date   TSH 2.204 10/18/2021   Last vitamin D No results found for: "25OHVITD2", "25OHVITD3", "VD25OH" Last vitamin B12 and Folate No results found for: "VITAMINB12", "FOLATE"      Assessment & Plan:    Routine Health Maintenance and Physical Exam  Immunization History  Administered Date(s) Administered   COVID-19, mRNA, vaccine(Comirnaty)12 years and older 06/20/2022   Influenza,inj,Quad PF,6+ Mos 04/15/2022   Td 09/27/2007   Tdap 12/27/2021    Health Maintenance  Topic Date Due   COVID-19 Vaccine (2 - Pfizer risk series) 07/11/2022   DTaP/Tdap/Td (3 - Td or Tdap) 12/28/2031   INFLUENZA VACCINE  Completed   Hepatitis C Screening  Completed   HIV Screening  Completed   HPV VACCINES  Aged Out    Discussed health benefits of physical activity, and encouraged him to engage in regular exercise appropriate for his age and condition.  Problem List Items Addressed This Visit   None Visit Diagnoses     Vitamin D deficiency    -  Primary   Relevant Orders   VITAMIN D 25 Hydroxy (Vit-D Deficiency, Fractures)   Other specified disorders of thyroid       Relevant Orders   CBC with Differential/Platelet   CMP14+EGFR   Lipid panel   Other hyperlipidemia       Impaired fasting blood sugar       IFG (impaired fasting glucose)       Relevant Orders   Hemoglobin A1c   Other specified hypothyroidism       Relevant Orders   TSH + free T4      Patient will follow-up in 4 months.  Brandt Loosen, RN

## 2022-10-17 NOTE — Patient Instructions (Addendum)
I appreciate the opportunity to provide care to you today!    Follow up:  4 months  Labs: please stop by the lab today to get your blood drawn (CBC, CMP, TSH, Lipid profile, HgA1c, Vit D)  Please continue to a heart-healthy diet and increase your physical activities. Try to exercise for 50mins at least five days a week.   Physical activity helps: Lower your blood glucose, improve your heart health, lower your blood pressure and cholesterol, burn calories to help manage her weight, gave you energy, lower stress, and improve his sleep.  The American diabetes Association (ADA) recommends being active for 2-1/2 hours (150 minutes) or more week.  Exercise for 30 minutes, 5 days a week (150 minutes total)    It was a pleasure to see you and I look forward to continuing to work together on your health and well-being. Please do not hesitate to call the office if you need care or have questions about your care.   Have a wonderful day and week. With Gratitude, Alvira Monday MSN, FNP-BC

## 2022-10-17 NOTE — Assessment & Plan Note (Signed)

## 2022-10-18 LAB — CBC WITH DIFFERENTIAL/PLATELET
Basophils Absolute: 0 10*3/uL (ref 0.0–0.2)
Basos: 0 %
EOS (ABSOLUTE): 0.2 10*3/uL (ref 0.0–0.4)
Eos: 3 %
Hematocrit: 43 % (ref 37.5–51.0)
Hemoglobin: 14.7 g/dL (ref 13.0–17.7)
Immature Grans (Abs): 0 10*3/uL (ref 0.0–0.1)
Immature Granulocytes: 0 %
Lymphocytes Absolute: 1.7 10*3/uL (ref 0.7–3.1)
Lymphs: 25 %
MCH: 30.4 pg (ref 26.6–33.0)
MCHC: 34.2 g/dL (ref 31.5–35.7)
MCV: 89 fL (ref 79–97)
Monocytes Absolute: 0.5 10*3/uL (ref 0.1–0.9)
Monocytes: 7 %
Neutrophils Absolute: 4.2 10*3/uL (ref 1.4–7.0)
Neutrophils: 65 %
Platelets: 245 10*3/uL (ref 150–450)
RBC: 4.83 x10E6/uL (ref 4.14–5.80)
RDW: 13.7 % (ref 11.6–15.4)
WBC: 6.6 10*3/uL (ref 3.4–10.8)

## 2022-10-18 LAB — LIPID PANEL
Chol/HDL Ratio: 4.4 ratio (ref 0.0–5.0)
Cholesterol, Total: 191 mg/dL (ref 100–199)
HDL: 43 mg/dL (ref >39)
LDL Chol Calc (NIH): 129 mg/dL — ABNORMAL HIGH (ref 0–99)
Triglycerides: 103 mg/dL (ref 0–149)
VLDL Cholesterol Cal: 19 mg/dL (ref 5–40)

## 2022-10-18 LAB — CMP14+EGFR
ALT: 9 IU/L (ref 0–44)
AST: 19 IU/L (ref 0–40)
Albumin/Globulin Ratio: 1.7 (ref 1.2–2.2)
Albumin: 4.8 g/dL (ref 4.1–5.1)
Alkaline Phosphatase: 80 IU/L (ref 44–121)
BUN/Creatinine Ratio: 8 — ABNORMAL LOW (ref 9–20)
BUN: 10 mg/dL (ref 6–20)
Bilirubin Total: 0.4 mg/dL (ref 0.0–1.2)
CO2: 22 mmol/L (ref 20–29)
Calcium: 10 mg/dL (ref 8.7–10.2)
Chloride: 103 mmol/L (ref 96–106)
Creatinine, Ser: 1.18 mg/dL (ref 0.76–1.27)
Globulin, Total: 2.8 g/dL (ref 1.5–4.5)
Glucose: 75 mg/dL (ref 70–99)
Potassium: 4.2 mmol/L (ref 3.5–5.2)
Sodium: 141 mmol/L (ref 134–144)
Total Protein: 7.6 g/dL (ref 6.0–8.5)
eGFR: 85 mL/min/{1.73_m2} (ref >59)

## 2022-10-18 LAB — VITAMIN D 25 HYDROXY (VIT D DEFICIENCY, FRACTURES): Vit D, 25-Hydroxy: 25.4 ng/mL — ABNORMAL LOW (ref 30.0–100.0)

## 2022-10-18 LAB — HEMOGLOBIN A1C
Est. average glucose Bld gHb Est-mCnc: 111 mg/dL
Hgb A1c MFr Bld: 5.5 % (ref 4.8–5.6)

## 2022-10-18 LAB — TSH+FREE T4
Free T4: 1.23 ng/dL (ref 0.82–1.77)
TSH: 1.51 u[IU]/mL (ref 0.450–4.500)

## 2022-10-24 DIAGNOSIS — Z419 Encounter for procedure for purposes other than remedying health state, unspecified: Secondary | ICD-10-CM | POA: Diagnosis not present

## 2022-11-19 LAB — GLUCOSE, POCT (MANUAL RESULT ENTRY): Glucose Fasting, POC: 106 mg/dL — AB (ref 70–99)

## 2022-11-23 DIAGNOSIS — Z419 Encounter for procedure for purposes other than remedying health state, unspecified: Secondary | ICD-10-CM | POA: Diagnosis not present

## 2022-11-29 ENCOUNTER — Encounter: Payer: Self-pay | Admitting: *Deleted

## 2022-11-29 NOTE — Progress Notes (Signed)
Pt attended screening event on 11/19/22 at [place], where screening results were wnl. Pt confirmed Gilmore Laroche, FNP, as his PCP, at [facility] and did not indicated any SDOH insecurities at the event. Per chart review, pt had last PCP appt on 10/17/22 and has upcoming PCP appointment on 02/20/2023. No additional health equity team support indicated at this time.

## 2022-12-01 ENCOUNTER — Other Ambulatory Visit (HOSPITAL_COMMUNITY): Payer: Self-pay

## 2022-12-02 ENCOUNTER — Other Ambulatory Visit: Payer: Self-pay

## 2022-12-05 ENCOUNTER — Other Ambulatory Visit (HOSPITAL_COMMUNITY): Payer: Self-pay

## 2022-12-05 ENCOUNTER — Other Ambulatory Visit: Payer: Self-pay

## 2022-12-07 ENCOUNTER — Other Ambulatory Visit (HOSPITAL_COMMUNITY): Payer: Self-pay

## 2022-12-07 ENCOUNTER — Telehealth: Payer: Self-pay

## 2022-12-07 NOTE — Telephone Encounter (Signed)
Pharmacy Patient Advocate Encounter- Gary Barrett BIV-Medical Benefit:  J code: [id number]  CPT code: 60454  Dx Code: B20  [employer]

## 2022-12-13 IMAGING — CT CT HEART MORP W/ CTA COR W/ SCORE W/ CA W/CM &/OR W/O CM
4 of 7 series · 8 of 20 positions shown, 9 images · non-contrast
Comparison: None.
COMPARISON: None.

Addendum:
EXAM:
OVER-READ INTERPRETATION  CT CHEST

The following report is an over-read performed by radiologist Dr.
Kween Kania [REDACTED] on 10/19/2021. This
over-read does not include interpretation of cardiac or coronary
anatomy or pathology. The coronary calcium score/coronary CTA
interpretation by the cardiologist is attached.
TECHNIQUE: A non-contrast, gated CT scan was obtained with axial slices of 3 mm
through the heart for calcium scoring. Calcium scoring was performed
using the Agatston method. A 120 kV prospective, gated, contrast
cardiac scan was obtained. Gantry rotation speed was 250 msecs and
collimation was 0.6 mm. Two sublingual nitroglycerin tablets (0.8
mg) were given. The 3D data set was reconstructed in 5% intervals of
the 35-75% of the R-R cycle. Diastolic phases were analyzed on a
dedicated workstation using MPR, MIP, and VRT modes. The patient
received 95 cc of contrast.

[Series 7: best syst · axial · 0.39mm/px · z∈[+1131,+1170]mm · 2 of 290 slices shown, 3 images]
[im 97/290  vessel]
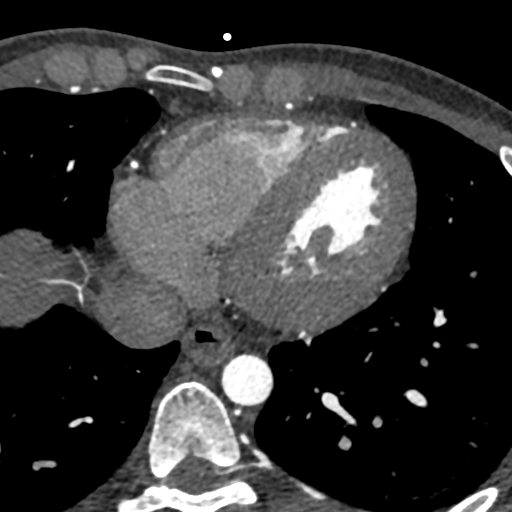
[im 97/290  lung]
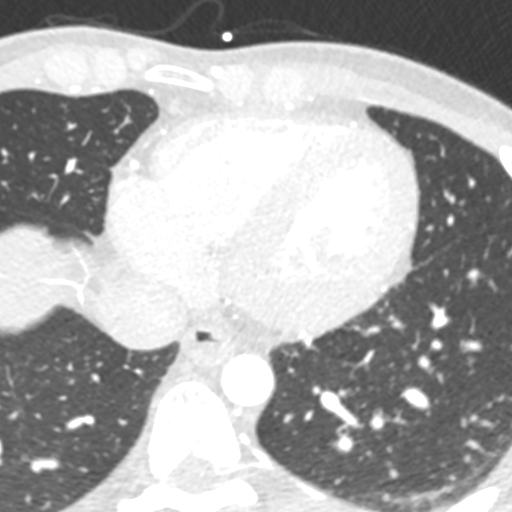
[im 193/290  vessel]
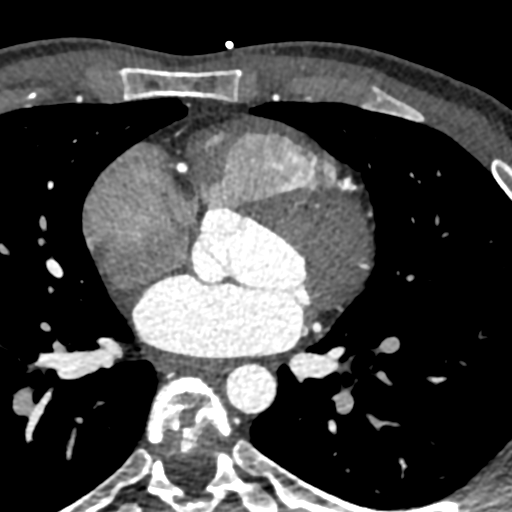

[Series 8: best diast · axial · 0.39mm/px · z∈[+1131,+1170]mm · 2 of 290 slices shown]
[im 97/290  vessel]
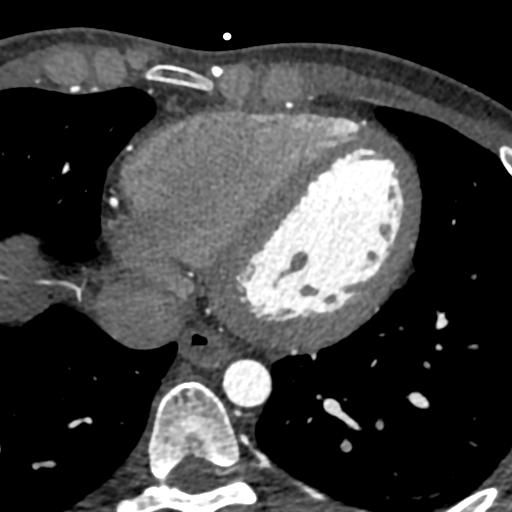
[im 193/290  vessel]
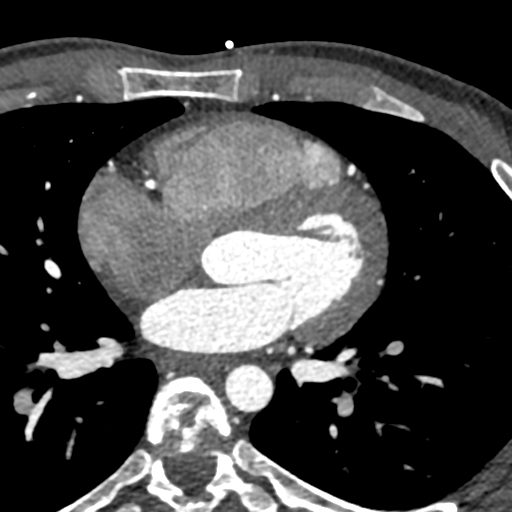

[Series 9: ts diast · axial · 0.39mm/px · z∈[+1131,+1170]mm · 2 of 290 slices shown]
[im 97/290  vessel]
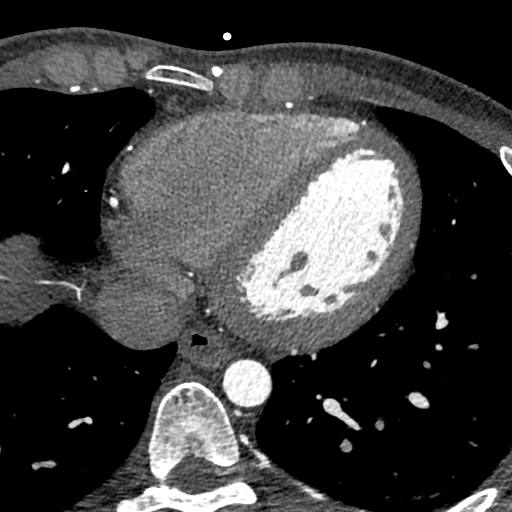
[im 193/290  vessel]
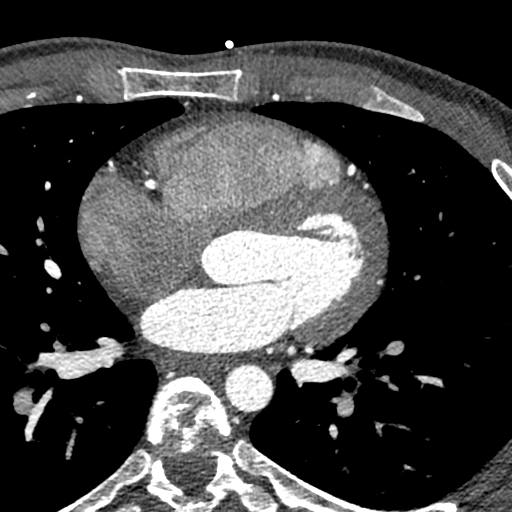

[Series 10: ts syst · axial · 0.39mm/px · z∈[+1131,+1170]mm · 2 of 290 slices shown]
[im 97/290  vessel]
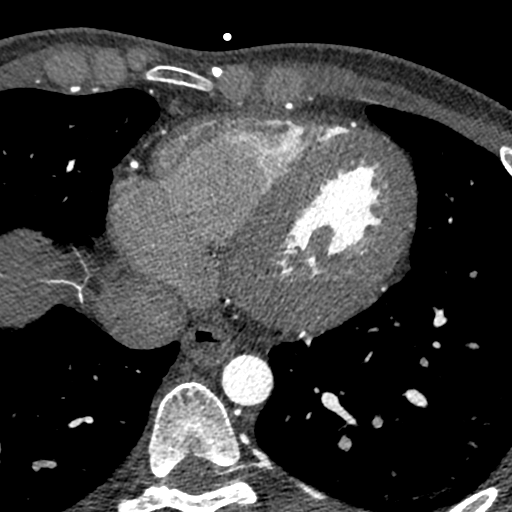
[im 193/290  vessel]
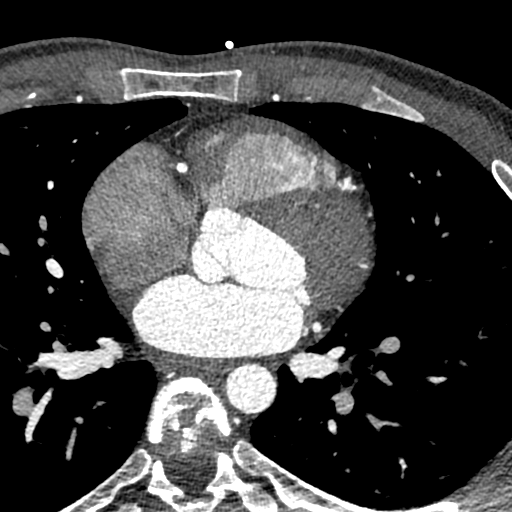

[8 of 20 positions shown; findings below may reference images not displayed]

FINDINGS: Vascular: Normal heart size. No pericardial effusion. No suspicious
filling defects of the visualized central pulmonary arteries. Normal
caliber thoracic aorta with no atherosclerotic disease.

Mediastinum/Nodes: Esophagus is unremarkable. No enlarged lymph
nodes seen in the chest.

Lungs/Pleura: Central airways are patent. Mild left basilar
atelectasis. No consolidation, pleural effusion or pneumothorax.

Upper Abdomen: No acute abnormality.

Musculoskeletal: No chest wall mass or suspicious bone lesions
identified.
IMPRESSION: No acute extracardiac abnormality.

EXAM:
Cardiac/Coronary CTA
FINDINGS: Image quality: Excellent.

Noise artifact is: Limited.

Coronary Arteries:  Normal coronary origin.  Right dominance.

Left main: The left main is a large caliber vessel with a normal
take off from the left coronary cusp that bifurcates to form a left
anterior descending artery and a left circumflex artery. trifurcates
into a LAD, LCX, and ramus intermedius. There is no plaque or
stenosis.

Left anterior descending artery: The LAD is patent without evidence
of plaque or stenosis. The LAD gives off 3 patent diagonal branches.

Ramus intermedius: Patent with no evidence of plaque or stenosis.

Left circumflex artery: The LCX is non-dominant and patent with no
evidence of plaque or stenosis. The LCX gives off a small GJAand
large branching OM2.

Right coronary artery: The RCA is dominant with normal take off from
the right coronary cusp. There is no evidence of plaque or stenosis.
The RCA terminates as a PDA and right posterolateral branch without
evidence of plaque or stenosis.

Right Atrium: Right atrial size is within normal limits.

Right Ventricle: The right ventricular cavity is within normal
limits.

Left Atrium: Left atrial size is normal in size with no left atrial
appendage filling defect.

Left Ventricle: The ventricular cavity size is within normal limits.
There are no stigmata of prior infarction. There is no abnormal
filling defect.

Pulmonary arteries: Normal in size without proximal filling defect.

Pulmonary veins: Normal pulmonary venous drainage.

Pericardium: Normal thickness with no significant effusion or
calcium present.

Cardiac valves: The aortic valve is trileaflet without significant
calcification. The mitral valve is normal structure without
significant calcification.

Aorta: Normal caliber with no significant disease.

Extra-cardiac findings: See attached radiology report for
non-cardiac structures.
IMPRESSION: 1. Coronary calcium score of 0. This was 0 percentile for age-, sex,
and race-matched controls.

2. Normal coronary origin with right dominance.

3. Normal coronary arteries.  CAD RADS 0.

4.  Consider non atherosclerotic causes of chest pain.

RECOMMENDATIONS:
1. CAD-RADS 0: No evidence of CAD (0%). Consider non-atherosclerotic
causes of chest pain.

2. CAD-RADS 1: Minimal non-obstructive CAD (0-24%). Consider
non-atherosclerotic causes of chest pain. Consider preventive
therapy and risk factor modification.

3. CAD-RADS 2: Mild non-obstructive CAD (25-49%). Consider
non-atherosclerotic causes of chest pain. Consider preventive
therapy and risk factor modification.

4. CAD-RADS 3: Moderate stenosis. Consider symptom-guided
anti-ischemic pharmacotherapy as well as risk factor modification
per guideline directed care. Additional analysis with CT FFR will be
submitted.

5. CAD-RADS 4: Severe stenosis. (70-99% or > 50% left main). Cardiac
catheterization or CT FFR is recommended. Consider symptom-guided
anti-ischemic pharmacotherapy as well as risk factor modification
per guideline directed care. Invasive coronary angiography
recommended with revascularization per published guideline
statements.

6. CAD-RADS 5: Total coronary occlusion (100%). Consider cardiac
catheterization or viability assessment. Consider symptom-guided
anti-ischemic pharmacotherapy as well as risk factor modification
per guideline directed care.

7. CAD-RADS N: Non-diagnostic study. Obstructive CAD can't be
excluded. Alternative evaluation is recommended.

*** End of Addendum ***
EXAM:
OVER-READ INTERPRETATION  CT CHEST

The following report is an over-read performed by radiologist Dr.
Kween Kania [REDACTED] on 10/19/2021. This
over-read does not include interpretation of cardiac or coronary
anatomy or pathology. The coronary calcium score/coronary CTA
interpretation by the cardiologist is attached.
FINDINGS: Vascular: Normal heart size. No pericardial effusion. No suspicious
filling defects of the visualized central pulmonary arteries. Normal
caliber thoracic aorta with no atherosclerotic disease.

Mediastinum/Nodes: Esophagus is unremarkable. No enlarged lymph
nodes seen in the chest.

Lungs/Pleura: Central airways are patent. Mild left basilar
atelectasis. No consolidation, pleural effusion or pneumothorax.

Upper Abdomen: No acute abnormality.

Musculoskeletal: No chest wall mass or suspicious bone lesions
identified.
IMPRESSION: No acute extracardiac abnormality.

## 2022-12-13 NOTE — Progress Notes (Deleted)
HPI: Gary Barrett is a 32 y.o. male who presents to the [facility] pharmacy clinic for [place] administration.  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   CABOTEGRAVIR & RILPIVIRINE ER (CABENUVA) 600 & 900 MG/3ML INJECTION    Inject 1 kit into the muscle every 30 (thirty) days.   CABOTEGRAVIR & RILPIVIRINE ER (CABENUVA) 600 & 900 MG/3ML INJECTION    Inject 1 kit into the muscle every 2 (two) months.   DEXTROMETHORPHAN-GUAIFENESIN (MUCINEX DM) 30-600 MG 12HR TABLET    Take 1 tablet by mouth 2 (two) times daily as needed for cough (20).   DIAZEPAM (VALIUM) 5 MG TABLET    Take 5 mg by mouth every 8 (eight) hours as needed for anxiety.   DOCUSATE SODIUM (COLACE) 100 MG CAPSULE    Take 1 capsule (100 mg total) by mouth 2 (two) times daily.   FLUTICASONE (FLONASE) 50 MCG/ACT NASAL SPRAY    Place 1 spray into both nostrils daily.   IBUPROFEN (ADVIL) 600 MG TABLET    Take 1 tablet (600 mg total) by mouth every 6 (six) hours as needed.   METHOCARBAMOL (ROBAXIN) 750 MG TABLET    Take 1 tablet (750 mg total) by mouth  every 6 (six) hours as needed for muscle spasms.   NONFORMULARY OR COMPOUNDED ITEM    Place 1 application  rectally daily as needed (pain).  nitroglycerin/lidocaine rectal ointment   OMEPRAZOLE (PRILOSEC) 40 MG CAPSULE    Take 1 capsule (40 mg total) by mouth daily.   OXYCODONE (OXY IR/ROXICODONE) 5 MG IMMEDIATE RELEASE TABLET    Take 5 mg by mouth every 6 (six) hours as needed for severe pain.   PANTOPRAZOLE (PROTONIX) 40 MG TABLET    Take 40 mg by mouth daily.  Modified Medications   No medications on file  Discontinued Medications   No medications on file    Allergies: Allergies  Allergen Reactions   Zofran [Ondansetron Hcl] Other (See Comments)    Constipation. Pt reports that infection disease physician instructed him to d/c.    Past Medical History: Past Medical History:  Diagnosis Date   HIV (human immunodeficiency virus infection) (HCC)     Social History: Social History   Socioeconomic History   Marital status: Single    Spouse name: Not on file   Number of children: Not on file   Years of education: 16   Highest education level: Not on file  Occupational History   Not on file  Tobacco Use   Smoking status: Some Days    Packs/day: .  1    Types: Cigarettes    Passive exposure: Never   Smokeless tobacco: Never   Tobacco comments:    Smokes about 1 pack per week, cutting back  Vaping Use   Vaping Use: Never used  Substance and Sexual Activity   Alcohol use: Not Currently    Alcohol/week: 1.0 standard drink of alcohol    Types: 1 Glasses of wine per week    Comment: occasional   Drug use: Not Currently    Types: Marijuana    Comment: daily - helps with nausea   Sexual activity: Yes    Comment: declined condoms  Other Topics Concern   Not on file  Social History Narrative   Not on file   Social Determinants of Health   Financial Resource Strain: Not on file  Food Insecurity: No Food Insecurity (11/19/2022)   Hunger Vital Sign    Worried About Running  Out of Food in the Last Year: Never true    Ran Out of Food in the Last Year: Never true  Transportation Needs: No Transportation Needs (11/19/2022)   PRAPARE - Administrator, Civil Service (Medical): No    Lack of Transportation (Non-Medical): No  Physical Activity: Not on file  Stress: Not on file  Social Connections: Not on file    Labs: Lab Results  Component Value Date   HIV1RNAQUANT Not Detected 06/20/2022   HIV1RNAQUANT Not Detected 04/13/2022   HIV1RNAQUANT 26 (H) 03/07/2022   CD4TABS 554 06/20/2022   CD4TABS 546 03/07/2022   CD4TABS 554 02/02/2022    RPR and STI Lab Results  Component Value Date   LABRPR REACTIVE (A) 06/20/2022   LABRPR REACTIVE (A) 10/21/2021   RPRTITER 1:128 (H) 06/20/2022   RPRTITER 1:2,048 (H) 10/21/2021        No data to display          Hepatitis B Lab Results  Component Value Date   HEPBSAB REACTIVE (A) 10/21/2021   HEPBSAG NON-REACTIVE 10/21/2021   HEPBCAB NON-REACTIVE 10/21/2021   Hepatitis C Lab Results  Component Value Date   HEPCAB NON-REACTIVE 10/21/2021   Hepatitis A Lab Results  Component Value Date   HAV REACTIVE (A) 10/21/2021   Lipids: Lab Results  Component Value Date   CHOL 191 10/17/2022   TRIG 103 10/17/2022   HDL 43 10/17/2022   CHOLHDL 4.4 10/17/2022   VLDL 25 10/18/2021   LDLCALC 129 (H) 10/17/2022    TARGET DATE: The 23rd   Assessment: [name] presents today for his maintenance Cabenuva injections. Past injections were tolerated well without issues. No new sexual partners since last visit. He denies any concerning STI symptoms. Politely declines STI testing today.   Administered cabotegravir 600mg /48mL in left upper outer quadrant of the gluteal muscle. Administered rilpivirine 900 mg/41mL in the right upper outer quadrant of the gluteal muscle. No issues with injections. *** will follow up in 2 months for next set of injections.  Plan: - Cabenuva injections administered - Next  injections scheduled for *** - Call with any issues or questions  Irish Elders, PharmD PGY-1 [facility] Pharmacy Resident

## 2022-12-14 ENCOUNTER — Ambulatory Visit: Payer: 59 | Admitting: Pharmacist

## 2022-12-14 ENCOUNTER — Other Ambulatory Visit: Payer: Self-pay

## 2022-12-14 ENCOUNTER — Other Ambulatory Visit (HOSPITAL_COMMUNITY)
Admission: RE | Admit: 2022-12-14 | Discharge: 2022-12-14 | Disposition: A | Discharge status: Home / Self Care | Payer: 59 | Source: Ambulatory Visit | Attending: Family | Admitting: Family

## 2022-12-14 DIAGNOSIS — Z113 Encounter for screening for infections with a predominantly sexual mode of transmission: Secondary | ICD-10-CM

## 2022-12-14 DIAGNOSIS — B20 Human immunodeficiency virus [HIV] disease: Secondary | ICD-10-CM | POA: Diagnosis not present

## 2022-12-14 MED ORDER — CABOTEGRAVIR & RILPIVIRINE ER 600 & 900 MG/3ML IM SUER
1.0000 | Freq: Once | INTRAMUSCULAR | Status: AC
  Administered 2022-12-14: 1 via INTRAMUSCULAR

## 2022-12-14 NOTE — Progress Notes (Signed)
HPI: Gary Barrett is a 32 y.o. male who presents to the [facility] pharmacy clinic for [place] administration.  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   CABOTEGRAVIR & RILPIVIRINE ER (CABENUVA) 600 & 900 MG/3ML INJECTION    Inject 1 kit into the muscle every 30 (thirty) days.   CABOTEGRAVIR & RILPIVIRINE ER (CABENUVA) 600 & 900 MG/3ML INJECTION    Inject 1 kit into the muscle every 2 (two) months.   DEXTROMETHORPHAN-GUAIFENESIN (MUCINEX DM) 30-600 MG 12HR TABLET    Take 1 tablet by mouth 2 (two) times daily as needed for cough (20).   DIAZEPAM (VALIUM) 5 MG TABLET    Take 5 mg by mouth every 8 (eight) hours as needed for anxiety.   DOCUSATE SODIUM (COLACE) 100 MG CAPSULE    Take 1 capsule (100 mg total) by mouth 2 (two) times daily.   FLUTICASONE (FLONASE) 50 MCG/ACT NASAL SPRAY    Place 1 spray into both nostrils daily.   IBUPROFEN (ADVIL) 600 MG TABLET    Take 1 tablet (600 mg total) by mouth every 6 (six) hours as needed.   METHOCARBAMOL (ROBAXIN) 750 MG TABLET    Take 1 tablet (750 mg total) by mouth  every 6 (six) hours as needed for muscle spasms.   NONFORMULARY OR COMPOUNDED ITEM    Place 1 application  rectally daily as needed (pain).  nitroglycerin/lidocaine rectal ointment   OMEPRAZOLE (PRILOSEC) 40 MG CAPSULE    Take 1 capsule (40 mg total) by mouth daily.   OXYCODONE (OXY IR/ROXICODONE) 5 MG IMMEDIATE RELEASE TABLET    Take 5 mg by mouth every 6 (six) hours as needed for severe pain.   PANTOPRAZOLE (PROTONIX) 40 MG TABLET    Take 40 mg by mouth daily.  Modified Medications   No medications on file  Discontinued Medications   No medications on file    Allergies: Allergies  Allergen Reactions   Zofran [Ondansetron Hcl] Other (See Comments)    Constipation. Pt reports that infection disease physician instructed him to d/c.    Past Medical History: Past Medical History:  Diagnosis Date   HIV (human immunodeficiency virus infection) (HCC)     Social History: Social History   Socioeconomic History   Marital status: Single    Spouse name: Not on file   Number of children: Not on file   Years of education: 16   Highest education level: Not on file  Occupational History   Not on file  Tobacco Use   Smoking status: Some Days    Packs/day: .  1    Types: Cigarettes    Passive exposure: Never   Smokeless tobacco: Never   Tobacco comments:    Smokes about 1 pack per week, cutting back  Vaping Use   Vaping Use: Never used  Substance and Sexual Activity   Alcohol use: Not Currently    Alcohol/week: 1.0 standard drink of alcohol    Types: 1 Glasses of wine per week    Comment: occasional   Drug use: Not Currently    Types: Marijuana    Comment: daily - helps with nausea   Sexual activity: Yes    Comment: declined condoms  Other Topics Concern   Not on file  Social History Narrative   Not on file   Social Determinants of Health   Financial Resource Strain: Not on file  Food Insecurity: No Food Insecurity (11/19/2022)   Hunger Vital Sign    Worried About Running  Out of Food in the Last Year: Never true    Ran Out of Food in the Last Year: Never true  Transportation Needs: No Transportation Needs (11/19/2022)   PRAPARE - Administrator, Civil Service (Medical): No    Lack of Transportation (Non-Medical): No  Physical Activity: Not on file  Stress: Not on file  Social Connections: Not on file    Labs: Lab Results  Component Value Date   HIV1RNAQUANT Not Detected 06/20/2022   HIV1RNAQUANT Not Detected 04/13/2022   HIV1RNAQUANT 26 (H) 03/07/2022   CD4TABS 554 06/20/2022   CD4TABS 546 03/07/2022   CD4TABS 554 02/02/2022    RPR and STI Lab Results  Component Value Date   LABRPR REACTIVE (A) 06/20/2022   LABRPR REACTIVE (A) 10/21/2021   RPRTITER 1:128 (H) 06/20/2022   RPRTITER 1:2,048 (H) 10/21/2021        No data to display           Hepatitis B Lab Results  Component Value Date   HEPBSAB REACTIVE (A) 10/21/2021   HEPBSAG NON-REACTIVE 10/21/2021   HEPBCAB NON-REACTIVE 10/21/2021   Hepatitis C Lab Results  Component Value Date   HEPCAB NON-REACTIVE 10/21/2021   Hepatitis A Lab Results  Component Value Date   HAV REACTIVE (A) 10/21/2021   Lipids: Lab Results  Component Value Date   CHOL 191 10/17/2022   TRIG 103 10/17/2022   HDL 43 10/17/2022   CHOLHDL 4.4 10/17/2022   VLDL 25 10/18/2021   LDLCALC 129 (H) 10/17/2022    TARGET DATE: The 23rd   Assessment: Marsean presents today for his maintenance Cabenuva injections. Past injections were tolerated well without issues. A HIV RNA assay was ordered to monitor his viral load. No new sexual partners since last visit, but accepted to STI testing and received oral/urine/rectal cytologies for GC/chlamydia and RPR for syphilis. He denies any concerning STI symptoms.   Administered cabotegravir 600mg /42mL in left upper outer quadrant of the gluteal muscle. Administered rilpivirine 900 mg/51mL in the right upper outer quadrant of the gluteal muscle. No issues  with injections. Elix will follow up in 2 months for next set of injections.  Striker qualifies for vaccines such as monkeypox, HPV, PCV-20, and menveo, but declined them today due to receiving Cabenuva injections. He wishes to schedule an appointment within one month to receive vaccines. He is not interested in the monkeypox vaccine, but is open to receiving the other three vaccines.  Plan: - Cabenuva injections administered - HIV RNA assay ordered - Oral/urine/rectal cytologies ordered for GC/chlamydia and RPR for syphilis  -  Next injections scheduled for 02/09/2023 at 11AM with Marcos Eke - Scheduled future maintenance injection with Marchelle Folks on 04/12/2023 at 9:15AM - Vaccine appointment scheduled for 01/17/2023 at 11AM with [facility] - Call with any issues or questions  Thanks,  Arabella Merles, PharmD. [facility] Acute Care PGY-1 12/14/2022 9:53 AM

## 2022-12-15 LAB — CYTOLOGY, (ORAL, ANAL, URETHRAL) ANCILLARY ONLY
Chlamydia: NEGATIVE
Chlamydia: POSITIVE — AB
Comment: NEGATIVE
Comment: NEGATIVE
Comment: NORMAL
Comment: NORMAL
Neisseria Gonorrhea: NEGATIVE
Neisseria Gonorrhea: NEGATIVE

## 2022-12-15 LAB — URINE CYTOLOGY ANCILLARY ONLY
Chlamydia: NEGATIVE
Comment: NEGATIVE
Comment: NORMAL
Neisseria Gonorrhea: NEGATIVE

## 2022-12-16 ENCOUNTER — Telehealth: Payer: Self-pay | Admitting: Pharmacist

## 2022-12-16 DIAGNOSIS — A749 Chlamydial infection, unspecified: Secondary | ICD-10-CM

## 2022-12-16 MED ORDER — DOXYCYCLINE HYCLATE 100 MG PO TABS: 100.0000 mg | ORAL_TABLET | Freq: Two times a day (BID) | ORAL | 0 refills | Status: DC

## 2022-12-16 NOTE — Telephone Encounter (Signed)
I called the patient to discuss his positive test results for chlamydia rectally. I counseled the patient that he would need to take doxycycline 100 mg two times a day for 7 days. He is aware to take the medication with food to help reduce nausea and with a glass of water. Vuong was surprised to see a positive test result since he has not been sexually active. I discussed that the infection can go unnoticed for some time especially if no symptoms are present. He understood and all other questions were answered at this time. He will refrain from any sexual activity for the 7 days of therapy and then for 7 days after. Depending on sexual activity in the future may be a good candidate for doxyPEP, can discuss with the patient at future appoitnments.   Thanks,  Arabella Merles, PharmD. [facility] Acute Care PGY-1 12/16/2022 8:56 AM

## 2022-12-17 LAB — HIV-1 RNA QUANT-NO REFLEX-BLD
HIV 1 RNA Quant: <20 Copies/mL — ABNORMAL HIGH
HIV-1 RNA Quant, Log: <1.3 Log cps/mL — ABNORMAL HIGH

## 2022-12-17 LAB — RPR: RPR Ser Ql: REACTIVE — AB

## 2022-12-17 LAB — RPR TITER: RPR Titer: 1:64 {titer} — ABNORMAL HIGH

## 2022-12-17 LAB — T PALLIDUM AB: T Pallidum Abs: POSITIVE — AB

## 2022-12-24 DIAGNOSIS — Z419 Encounter for procedure for purposes other than remedying health state, unspecified: Secondary | ICD-10-CM | POA: Diagnosis not present

## 2023-01-17 ENCOUNTER — Ambulatory Visit: Payer: 59 | Admitting: Pharmacist

## 2023-01-23 DIAGNOSIS — Z419 Encounter for procedure for purposes other than remedying health state, unspecified: Secondary | ICD-10-CM | POA: Diagnosis not present

## 2023-02-09 ENCOUNTER — Encounter: Payer: Self-pay | Admitting: Family

## 2023-02-09 ENCOUNTER — Ambulatory Visit (INDEPENDENT_AMBULATORY_CARE_PROVIDER_SITE_OTHER): Payer: 59 | Admitting: Family

## 2023-02-09 ENCOUNTER — Other Ambulatory Visit: Payer: Self-pay

## 2023-02-09 VITALS — BP 127/76 | HR 72 | Temp 98.5°F | Ht 68.0 in | Wt 170.0 lb

## 2023-02-09 DIAGNOSIS — A749 Chlamydial infection, unspecified: Secondary | ICD-10-CM | POA: Diagnosis not present

## 2023-02-09 DIAGNOSIS — B20 Human immunodeficiency virus [HIV] disease: Secondary | ICD-10-CM | POA: Diagnosis not present

## 2023-02-09 DIAGNOSIS — A539 Syphilis, unspecified: Secondary | ICD-10-CM

## 2023-02-09 DIAGNOSIS — Z0000 Encounter for general adult medical examination without abnormal findings: Secondary | ICD-10-CM

## 2023-02-09 MED ORDER — CABOTEGRAVIR & RILPIVIRINE ER 600 & 900 MG/3ML IM SUER
1.0000 | Freq: Once | INTRAMUSCULAR | Status: AC
  Administered 2023-02-09: 1 via INTRAMUSCULAR

## 2023-02-09 NOTE — Patient Instructions (Addendum)
Nice to see you.  We will check your lab work today.  Continue to take your medication daily as prescribed.  Refills have been sent to the pharmacy.  Plan for follow up in 2 months or sooner if needed with lab work on the same day and with Tammy Sours in 6 months.   Have a great day and stay safe!

## 2023-02-09 NOTE — Assessment & Plan Note (Signed)
Syphilis titer continues to decrease appropriately with no evidence of current infection. Reviewed RPR results. Continue to monitor.

## 2023-02-09 NOTE — Assessment & Plan Note (Signed)
Diagnosed with chlamydia at last office visit and treated with doxycycline 100 mg PO bid x 7 days which was completed.

## 2023-02-09 NOTE — Assessment & Plan Note (Addendum)
Discussed importance of safe sexual practice and condom use. Condoms and STD testing offered.  Declines vaccinations.  Anal cancer screening completed with General Surgery in March 2024.

## 2023-02-09 NOTE — Assessment & Plan Note (Signed)
As he continues to have well-controlled virus with good adherence and tolerance to [place].  Reviewed previous lab work and discussed plan of care.  Injection provided with no adverse side effects.  Plan for follow-up in 2 months for next injection with pharmacy provider and with me in 6 months or sooner if needed.

## 2023-02-09 NOTE — Progress Notes (Signed)
Brief Narrative   Patient ID: [name], male    DOB: [birth year], 32 y.o.   MRN: [id number]  Mr. 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 Initial viral load was 40,500 with CD4 count of 199. Genotype with no significant medication resistant mutations. No history of opportunistic infection. [id number] negative. Entered care at [facility] Stage 3. Had nausea with multiple regimens including Biktarvy, Dovato, Delstrigo, Isentress/Truvada. Now on Cabenuva.   Subjective:    Chief Complaint  Patient presents with   Follow-up   HIV Positive/AIDS    HPI:  Gary Barrett is a 32 y.o. male with HIV disease last seen by Margarite Gouge,  PharmD, CPP on 12/14/22 with good adherence and tolerance to Cabenuva. Viral load was undetectable and CD4 554. STD testing was positive for chlamydia. RPR improved to 1:64 with previous treatment at 1:2048. Here today for follow up.   Matteus continues to do well with no adverse side effects.  Has plans to travel to [place] for vacation in the coming weeks.  No new concerns/complaints. Condoms and STD testing offered. Declines immunizations.  Denies fevers, chills, night sweats, headaches, changes in vision, neck pain/stiffness, nausea, diarrhea, vomiting, lesions or rashes.    Allergies  Allergen Reactions   Zofran [Ondansetron Hcl] Other (See Comments)    Constipation. Pt reports that infection disease physician instructed him to d/c.      Outpatient Medications Prior to Visit  Medication Sig Dispense Refill   acetaminophen (TYLENOL) 500 MG tablet Take 2 tablets (1,000 mg total) by mouth every 6 (six) hours as needed. 120 tablet 0   cabotegravir & rilpivirine ER (CABENUVA) 600 & 900 MG/3ML injection Inject 1 kit into the muscle every 2 (two) months. 6 mL 5   docusate sodium (COLACE) 100 MG capsule Take 1 capsule (100 mg total) by mouth 2 (two) times daily. 10 capsule 2   pantoprazole (PROTONIX) 40 MG  tablet Take 40 mg by mouth daily.     ibuprofen (ADVIL) 600 MG tablet Take 1 tablet (600 mg total) by mouth every 6 (six) hours as needed. 120 tablet 0   omeprazole (PRILOSEC) 40 MG capsule Take 1 capsule (40 mg total) by mouth daily. (Patient not taking: Reported on 10/17/2022) 90 capsule 3   dextromethorphan-guaiFENesin (MUCINEX DM) 30-600 MG 12hr tablet Take 1 tablet by mouth 2 (two) times daily as needed for cough (20). (Patient not taking: Reported on 10/13/2022) 10 tablet 0   diazepam (VALIUM) 5 MG tablet Take 5 mg by mouth every 8 (eight) hours as needed for anxiety. (Patient not taking: Reported on 10/13/2022)     doxycycline (VIBRA-TABS) 100 MG tablet Take 1 tablet (100 mg total) by mouth 2 (two) times daily. Take with food, no calcium containing products (Patient not taking: Reported on 02/09/2023) 14 tablet 0   fluticasone (FLONASE) 50 MCG/ACT nasal spray Place 1 spray into both nostrils daily. (Patient not taking: Reported on 10/13/2022) 11.1 mL 2   methocarbamol (ROBAXIN) 750 MG tablet Take 1 tablet (750 mg total) by mouth every 6 (six) hours as needed for muscle spasms. (Patient not taking: Reported on 02/09/2023) 120 tablet 0   NONFORMULARY OR COMPOUNDED ITEM Place 1 application  rectally daily as needed (pain).  nitroglycerin/lidocaine rectal ointment (Patient not taking: Reported on 10/13/2022)     oxyCODONE (OXY IR/ROXICODONE) 5 MG immediate release tablet Take 5 mg by mouth every 6 (six) hours as needed for  severe pain. (Patient not taking: Reported on 10/13/2022)     No facility-administered medications prior to visit.     Past Medical History:  Diagnosis Date   HIV (human immunodeficiency virus infection) (HCC)      Past Surgical History:  Procedure Laterality Date   ANAL FISTULOTOMY     BIOPSY  02/08/2022   Procedure: BIOPSY;  Surgeon: Lanelle Bal, DO;  [facility];  Service: Endoscopy;;   ESOPHAGOGASTRODUODENOSCOPY (EGD) WITH PROPOFOL N/A 02/08/2022    Procedure: ESOPHAGOGASTRODUODENOSCOPY (EGD) WITH PROPOFOL;  Surgeon: Lanelle Bal, DO;  [facility]: [facility];  Service: Endoscopy;  Laterality: N/A;  1:00pm   INNER EAR SURGERY        Review of Systems  Constitutional:  Negative for appetite change, chills, fatigue, fever and unexpected weight change.  Eyes:  Negative for visual disturbance.  Respiratory:  Negative for cough, chest tightness, shortness of breath and wheezing.   Cardiovascular:  Negative for chest pain and leg swelling.  Gastrointestinal:  Negative for abdominal pain, constipation, diarrhea, nausea and vomiting.  Genitourinary:  Negative for dysuria, flank pain, frequency, genital sores, hematuria and urgency.  Skin:  Negative for rash.  Allergic/Immunologic: Negative for immunocompromised state.  Neurological:  Negative for dizziness and headaches.      Objective:    BP 127/76   Pulse 72   Temp 98.5 F (36.9 C) (Temporal)   Ht 5\' 8"  (1.727 m)   Wt 170 lb (77.1 kg)   SpO2 98%   BMI 25.85 kg/m  Nursing note and vital signs reviewed.  Physical Exam Constitutional:      General: He is not in acute distress.    Appearance: He is well-developed.  Eyes:     Conjunctiva/sclera: Conjunctivae normal.  Cardiovascular:     Rate and Rhythm: Normal rate and regular rhythm.     Heart sounds: Normal heart sounds. No murmur heard.    No friction rub. No gallop.  Pulmonary:     Effort: Pulmonary effort is normal. No respiratory distress.     Breath sounds: Normal breath sounds. No wheezing or rales.  Chest:     Chest wall: No tenderness.  Abdominal:     General: Bowel sounds are normal.     Palpations: Abdomen is soft.     Tenderness: There is no abdominal tenderness.  Musculoskeletal:     Cervical back: Neck supple.  Lymphadenopathy:     Cervical: No cervical adenopathy.  Skin:    General: Skin is warm and dry.     Findings: No rash.  Neurological:     Mental Status: He is alert and oriented to  person, place, and time.  Psychiatric:        Behavior: Behavior normal.        Thought Content: Thought content normal.        Judgment: Judgment normal.         10/17/2022    8:13 AM 10/13/2022    8:58 AM 07/15/2022    9:28 AM 04/15/2022    9:29 AM 03/07/2022    2:04 PM  Depression screen PHQ 2/9  Decreased Interest 0 0 2 0 0  Down, Depressed, Hopeless 0 0 1 0 0  PHQ - 2 Score 0 0 3 0 0  Altered sleeping 0  1    Tired, decreased energy 0  1    Change in appetite 0  2    Feeling bad or failure about yourself  0  2    Trouble concentrating 0  1    Moving slowly or fidgety/restless 0  1    Suicidal thoughts 0  1    PHQ-9 Score 0  12    Difficult doing work/chores Not difficult at all  Somewhat difficult         Assessment & Plan:    Patient Active Problem List   Diagnosis Date Noted   Chlamydia 02/09/2023   Encounter for annual general medical examination with abnormal findings in adult 10/17/2022   Frequency of urination 10/17/2022   Need for RSV vaccination 07/15/2022   Syphilis 06/20/2022   Family history of colon cancer 05/02/2022   Anal pain 05/02/2022   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  Reviewed previous lab work and discussed plan of care.  Injection provided with no adverse side effects.  Plan for follow-up in 2 months for next injection with pharmacy provider and with me in 6 months or sooner if  needed.      Relevant Orders   COMPLETE METABOLIC PANEL WITH GFR   HIV-1 RNA quant-no reflex-bld   T-helper cell (CD4)- ([facility] clinic only)   Healthcare maintenance    Discussed importance of safe sexual practice and condom use. Condoms and STD testing offered.  Declines vaccinations.  Anal cancer screening completed with General Surgery in March 2024.       Syphilis    Syphilis titer continues to decrease appropriately with no evidence of current infection. Reviewed RPR results. Continue to monitor.       Chlamydia    Diagnosed with chlamydia at last office visit and treated with doxycycline 100 mg PO bid x 7 days which was completed.         I have discontinued Edward Guthmiller. Clapham's dextromethorphan-guaiFENesin, fluticasone, NONFORMULARY OR COMPOUNDED ITEM, diazepam, oxyCODONE, methocarbamol, ibuprofen, and doxycycline. I am also having him maintain his omeprazole, docusate sodium, pantoprazole, acetaminophen, and cabotegravir & rilpivirine ER. We administered cabotegravir & rilpivirine ER.   Meds ordered this encounter  Medications   cabotegravir & rilpivirine ER (CABENUVA) 600 & 900 MG/3ML injection 1 kit     Follow-up: Return in about 2 months (around 04/12/2023), or if symptoms worsen or fail to improve.   Marcos Eke, MSN, FNP-C Nurse Practitioner [facility] Main number: [phone]

## 2023-02-10 LAB — COMPLETE METABOLIC PANEL WITH GFR
ALT: 8 U/L — ABNORMAL LOW (ref 9–46)
Alkaline phosphatase (APISO): 57 U/L (ref 36–130)
BUN: 12 mg/dL (ref 7–25)
Globulin: 2.9 g/dL (calc) (ref 1.9–3.7)
Glucose, Bld: 89 mg/dL (ref 65–99)
Total Protein: 7.6 g/dL (ref 6.1–8.1)

## 2023-02-12 LAB — COMPLETE METABOLIC PANEL WITH GFR
AG Ratio: 1.6 (calc) (ref 1.0–2.5)
AST: 17 U/L (ref 10–40)
Albumin: 4.7 g/dL (ref 3.6–5.1)
CO2: 28 mmol/L (ref 20–32)
Calcium: 10.1 mg/dL (ref 8.6–10.3)
Chloride: 104 mmol/L (ref 98–110)
Creat: 1.15 mg/dL (ref 0.60–1.26)
Potassium: 4.5 mmol/L (ref 3.5–5.3)
Sodium: 139 mmol/L (ref 135–146)
Total Bilirubin: 0.8 mg/dL (ref 0.2–1.2)
eGFR: 87 mL/min/{1.73_m2} (ref >=60)

## 2023-02-12 LAB — HIV-1 RNA QUANT-NO REFLEX-BLD
HIV 1 RNA Quant: NOT DETECTED {copies}/mL
HIV-1 RNA Quant, Log: NOT DETECTED {Log_copies}/mL

## 2023-02-13 LAB — T-HELPER CELL (CD4) - (RCID CLINIC ONLY)
CD4 % Helper T Cell: 36 % (ref 33–65)
CD4 T Cell Abs: 716 /uL (ref 400–1790)

## 2023-02-20 ENCOUNTER — Ambulatory Visit: Payer: 59 | Admitting: Family Medicine

## 2023-02-21 ENCOUNTER — Encounter: Payer: Self-pay | Admitting: Family Medicine

## 2023-02-23 DIAGNOSIS — Z419 Encounter for procedure for purposes other than remedying health state, unspecified: Secondary | ICD-10-CM | POA: Diagnosis not present

## 2023-03-26 DIAGNOSIS — Z419 Encounter for procedure for purposes other than remedying health state, unspecified: Secondary | ICD-10-CM | POA: Diagnosis not present

## 2023-04-12 ENCOUNTER — Ambulatory Visit: Payer: 59 | Admitting: Pharmacist

## 2023-04-13 ENCOUNTER — Other Ambulatory Visit: Payer: Self-pay

## 2023-04-13 ENCOUNTER — Ambulatory Visit (INDEPENDENT_AMBULATORY_CARE_PROVIDER_SITE_OTHER): Payer: 59 | Admitting: Pharmacist

## 2023-04-13 DIAGNOSIS — B20 Human immunodeficiency virus [HIV] disease: Secondary | ICD-10-CM | POA: Diagnosis not present

## 2023-04-13 MED ORDER — CABOTEGRAVIR & RILPIVIRINE ER 600 & 900 MG/3ML IM SUER
1.0000 | Freq: Once | INTRAMUSCULAR | Status: AC
  Administered 2023-04-13: 1 via INTRAMUSCULAR

## 2023-04-13 NOTE — Progress Notes (Signed)
HPI: Gary Barrett is a 32 y.o. male who presents to the [facility] pharmacy clinic for [place] administration.  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   CABOTEGRAVIR & RILPIVIRINE ER (CABENUVA) 600 & 900 MG/3ML INJECTION    Inject 1 kit into the muscle every 2 (two) months.   DOCUSATE SODIUM (COLACE) 100 MG CAPSULE    Take 1 capsule (100 mg total) by mouth 2 (two) times daily.   OMEPRAZOLE (PRILOSEC) 40 MG CAPSULE    Take 1 capsule (40 mg total) by mouth daily.   PANTOPRAZOLE (PROTONIX) 40 MG TABLET    Take 40 mg by mouth daily.  Modified Medications   No medications on file  Discontinued Medications   No medications on file    Allergies: Allergies  Allergen Reactions   Zofran [Ondansetron Hcl] Other (See Comments)    Constipation. Pt reports that infection disease physician instructed him to d/c.    Past Medical History: Past Medical History:  Diagnosis Date   HIV (human immunodeficiency virus infection) (HCC)     Social History: Social History   Socioeconomic  History   Marital status: Single    Spouse name: Not on file   Number of children: Not on file   Years of education: 16   Highest education level: Not on file  Occupational History   Not on file  Tobacco Use   Smoking status: Some Days    Current packs/day: 0.10    Types: Cigarettes    Passive exposure: Never   Smokeless tobacco: Never   Tobacco comments:    Smokes about 1 pack per week, cutting back  Vaping Use   Vaping status: Never Used  Substance and Sexual Activity   Alcohol use: Not Currently    Alcohol/week: 1.0 standard drink of alcohol    Types: 1 Glasses of wine per week    Comment: occasional   Drug use: Not Currently    Types: Marijuana    Comment: daily - helps with nausea   Sexual activity: Yes    Comment: declined condoms  Other Topics Concern   Not on file  Social History Narrative   Not on file   Social Determinants of Health   Financial Resource Strain: Not on file  Food Insecurity: No Food Insecurity (11/19/2022)   Hunger Vital Sign    Worried About Running Out of Food in the Last Year: Never true    Ran Out of Food in the Last Year: Never true  Transportation Needs: No Transportation Needs (11/19/2022)  PRAPARE - Administrator, Civil Service (Medical): No    Lack of Transportation (Non-Medical): No  Physical Activity: Not on file  Stress: Not on file  Social Connections: Not on file    Labs: Lab Results  Component Value Date   HIV1RNAQUANT Not Detected 02/09/2023   HIV1RNAQUANT <20 (H) 12/14/2022   HIV1RNAQUANT Not Detected 06/20/2022   CD4TABS 716 02/09/2023   CD4TABS 554 06/20/2022   CD4TABS 546 03/07/2022    RPR and STI Lab Results  Component Value Date   LABRPR REACTIVE (A) 12/14/2022   LABRPR REACTIVE (A) 06/20/2022   LABRPR REACTIVE (A) 10/21/2021   RPRTITER 1:64 (H) 12/14/2022   RPRTITER 1:128 (H) 06/20/2022   RPRTITER 1:2,048 (H) 10/21/2021    STI Results GC CT  12/14/2022  9:38 AM Negative    Negative     Negative  Positive    Negative    Negative     Hepatitis B Lab Results  Component Value Date   HEPBSAB REACTIVE (A) 10/21/2021   HEPBSAG NON-REACTIVE 10/21/2021   HEPBCAB NON-REACTIVE 10/21/2021   Hepatitis C Lab Results  Component Value Date   HEPCAB NON-REACTIVE 10/21/2021   Hepatitis A Lab Results  Component Value Date   HAV REACTIVE (A) 10/21/2021   Lipids: Lab Results  Component Value Date   CHOL 191 10/17/2022   TRIG 103 10/17/2022   HDL 43 10/17/2022   CHOLHDL 4.4 10/17/2022   VLDL 25 10/18/2021   LDLCALC 129 (H) 10/17/2022    TARGET DATE:  The 23rd of the month  Assessment: [name] presents today for their maintenance Cabenuva injections. Initial/past injections were tolerated well without issues. No problems with systemic effects of injections. He continues to pre-medicate with OTC pain relievers to limit soreness after injection.   Administered cabotegravir 600mg /79mL in left upper outer quadrant of the gluteal muscle. Administered rilpivirine 900 mg/39mL in the right upper outer quadrant of the gluteal muscle. Monitored patient for 10 minutes after injection. Injections were tolerated well without issue. Patient will follow up in 2 months for next injection. Will defer HIV RNA as it was recently assessed in July.  Due for Menveo, HPV, COVID, and flu vaccines. States he plans on receiving COVID and flu vaccines tomorrow through his PCP. Would like to space out Menveo and HPV from [facility] appointments; scheduled to see me on 10/23 for Menveo and 1/3 HPV.   Plan: - Cabenuva injections administered - Follow up with me for Menveo and 1/3 HPV on 10/23 - Next injections scheduled for 11/19 with me, 1/23 with Tammy Sours, and 3/18 with me  - Call with any issues or questions  Margarite Gouge, PharmD, CPP, BCIDP, AAHIVP Clinical Pharmacist Practitioner Infectious Diseases Clinical Pharmacist [facility]

## 2023-04-14 ENCOUNTER — Encounter: Payer: Self-pay | Admitting: Family Medicine

## 2023-04-14 ENCOUNTER — Ambulatory Visit (INDEPENDENT_AMBULATORY_CARE_PROVIDER_SITE_OTHER): Payer: 59 | Admitting: Family Medicine

## 2023-04-14 VITALS — BP 132/82 | HR 102 | Ht 68.0 in | Wt 174.0 lb

## 2023-04-14 DIAGNOSIS — R110 Nausea: Secondary | ICD-10-CM | POA: Diagnosis not present

## 2023-04-14 DIAGNOSIS — K219 Gastro-esophageal reflux disease without esophagitis: Secondary | ICD-10-CM | POA: Insufficient documentation

## 2023-04-14 DIAGNOSIS — R7301 Impaired fasting glucose: Secondary | ICD-10-CM | POA: Diagnosis not present

## 2023-04-14 DIAGNOSIS — E559 Vitamin D deficiency, unspecified: Secondary | ICD-10-CM

## 2023-04-14 DIAGNOSIS — S0990XA Unspecified injury of head, initial encounter: Secondary | ICD-10-CM | POA: Diagnosis not present

## 2023-04-14 DIAGNOSIS — E038 Other specified hypothyroidism: Secondary | ICD-10-CM

## 2023-04-14 DIAGNOSIS — E7849 Other hyperlipidemia: Secondary | ICD-10-CM

## 2023-04-14 MED ORDER — PANTOPRAZOLE SODIUM 40 MG PO TBEC
40.0000 mg | DELAYED_RELEASE_TABLET | Freq: Every day | ORAL | 1 refills | Status: DC
Start: 2023-04-14 — End: 2023-08-17

## 2023-04-14 MED ORDER — PROMETHAZINE HCL 25 MG PO TABS
25.0000 mg | ORAL_TABLET | Freq: Three times a day (TID) | ORAL | 0 refills | Status: AC | PRN
Start: 2023-04-14 — End: ?
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
## 2023-04-14 NOTE — Patient Instructions (Addendum)
I appreciate the opportunity to provide care to you today!    Follow up:  5 months  Labs: please stop by the lab today to get your blood drawn (CBC, CMP, TSH, Lipid profile, HgA1c, Vit D)  Headache Management Recommendations -For your headaches, I recommend taking over-the-counter Tylenol 650 mg every 8 hours as needed for pain relief. It is important to stay well-hydrated, aiming for at least 64 ounces of water daily. -Please change positions slowly and avoid activities that may intensify your headache. Ensure you get plenty of rest in a quiet, dark environment to minimize sensory stimulation. Additionally, applying a cold pack to your forehead or neck can help reduce headache intensity and provide relief.  I have placed an order for a CT scan of your head to assess for any potential pathology. The referral coordinator will contact you shortly to schedule the appointment.  Please seek urgent medical care if you experience any of the symptoms with your headache: -Loss of Consciousness: Fainting or a decreased level of consciousness. -Confusion or Disorientation: Sudden changes in mental status, including difficulty speaking, understanding, or recognizing people. -Seizures: Any seizure activity, especially if it's new or prolonged. -Persistent Vomiting: Frequent vomiting that doesn't stop and is not related to other causes. -Weakness or Numbness: Sudden weakness or numbness in the face, arms, or legs, especially if it affects one side of the body. -Vision Changes: Sudden changes in vision, such as blurred or double vision. -Difficulty Walking or Coordination Issues: Trouble with balance or coordination. -Stiff Neck: Stiff neck accompanied by fever, indicating a possible infection. -Blood or Clear Fluid from Nose or Ears: Any unusual drainage that could indicate a serious condition.   Here are some foods to avoid or reduce in your diet to help manage cholesterol levels:  Fried  Foods:Deep-fried items such as french fries, fried chicken, and fried snacks are high in unhealthy fats and can raise LDL (bad) cholesterol levels. Processed Meats:Foods like bacon, sausage, hot dogs, and deli meats are often high in saturated fat and cholesterol. Full-Fat Dairy Products:Whole milk, full-fat yogurt, butter, cream, and cheese are rich in saturated fats, which can increase cholesterol levels. Baked Goods and Sweets:Pastries, cakes, cookies, and donuts often contain trans fats and added sugars, which can raise LDL cholesterol and lower HDL (good) cholesterol. Red Meat:Beef, lamb, and pork are high in saturated fat. Lean cuts or plant-based protein alternatives are better options. Lard and Shortening:Used in some baked goods, lard and shortening are high in trans fats and should be avoided. Fast Food:Many fast food items are cooked with unhealthy oils and contain high amounts of saturated and trans fats. Processed Snacks:Chips, crackers, and certain microwave popcorns can contain trans fats and high levels of unhealthy oils. Shellfish:While nutritious in other ways, some shellfish like shrimp, lobster, and crab are high in cholesterol. They should be consumed in moderation. Coconut and Palm Oils:these oils are high in saturated fat and can raise cholesterol levels when used in cooking or baking.      Please continue to a heart-healthy diet and increase your physical activities. Try to exercise for at least five days a week.    It was a pleasure to see you and I look forward to continuing to work together on your health and well-being. Please do not hesitate to call the office if you need care or have questions about your care.  In case of emergency, please visit the Emergency Department for urgent care, or contact our clinic at [phone]  to schedule an appointment. We're here to help you!   Have a wonderful day and week. With Gratitude, Gilmore Laroche MSN, FNP-BC

## 2023-04-14 NOTE — Assessment & Plan Note (Signed)
Refill sent to the pharmacy

## 2023-04-21 ENCOUNTER — Ambulatory Visit (HOSPITAL_BASED_OUTPATIENT_CLINIC_OR_DEPARTMENT_OTHER): Payer: 59 | Attending: Family Medicine

## 2023-04-25 DIAGNOSIS — Z419 Encounter for procedure for purposes other than remedying health state, unspecified: Secondary | ICD-10-CM | POA: Diagnosis not present

## 2023-05-15 NOTE — Progress Notes (Deleted)
   [facility] Vaccination Visit  HPI: Gary Barrett is a 32 y.o. male who presents to the [facility] pharmacy clinic for vaccine administration.  Hepatitis B Lab Results  Component Value Date   HEPBSAB REACTIVE (A) 10/21/2021   Lab Results  Component Value Date   HEPBSAG NON-REACTIVE 10/21/2021    Hepatitis C No results found for: "HCVAB"  Hepatitis A Lab Results  Component Value Date   HAV REACTIVE (A) 10/21/2021    Assessment & Plan: - Administered HPV 1/3 and Menveo vaccines - Patient tolerated well - Follow up 11/19 with me (can receive HPV then or separate from [place] appointments)  Margarite Gouge, PharmD, CPP, BCIDP, AAHIVP Clinical Pharmacist Practitioner Infectious Diseases Clinical Pharmacist [facility] 05/15/2023, 11:39 AM

## 2023-05-17 ENCOUNTER — Ambulatory Visit: Payer: 59 | Admitting: Pharmacist
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
## 2023-05-23 ENCOUNTER — Ambulatory Visit: Payer: 59 | Admitting: Pharmacist

## 2023-05-24 ENCOUNTER — Ambulatory Visit: Payer: 59 | Admitting: Pharmacist

## 2023-05-24 ENCOUNTER — Other Ambulatory Visit: Payer: Self-pay

## 2023-05-24 DIAGNOSIS — Z23 Encounter for immunization: Secondary | ICD-10-CM

## 2023-05-24 NOTE — Progress Notes (Addendum)
   [facility] Vaccination Visit  HPI: Gary Barrett is a 32 y.o. male who presents to the [facility] pharmacy clinic for vaccine administration.  Hepatitis B Lab Results  Component Value Date   HEPBSAB REACTIVE (A) 10/21/2021   Lab Results  Component Value Date   HEPBSAG NON-REACTIVE 10/21/2021    Hepatitis C No results found for: "HCVAB"  Hepatitis A Lab Results  Component Value Date   HAV REACTIVE (A) 10/21/2021    Assessment & Plan: - Administered HPV 1/3, Menveo 1/2, flu, and COVID vaccines - Patient tolerated well - Follow up 11/19 with me (can receive HPV then or separate from Gary Barrett)  Margarite Gouge, PharmD, CPP, BCIDP, AAHIVP Clinical Pharmacist Practitioner Infectious Diseases Clinical Pharmacist [facility] 05/24/2023, 3:50 PM

## 2023-05-26 DIAGNOSIS — Z419 Encounter for procedure for purposes other than remedying health state, unspecified: Secondary | ICD-10-CM | POA: Diagnosis not present

## 2023-06-12 ENCOUNTER — Ambulatory Visit: Payer: Self-pay | Admitting: Family Medicine
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
## 2023-06-12 NOTE — Telephone Encounter (Signed)
Copied from CRM [id number]. Topic: Clinical - Red Word Triage >> Jun 12, 2023 10:20 AM Prudencio Pair wrote: Red Word that prompted transfer to Nurse Triage: Patient states he has been sick for a few days. He's vomiting yellow stuff, can't keep anything down, head throbbing and can barely move. Also states nose is dripping constantly & horrible cough.  Chief Complaint: chest pain and pressure Symptoms: chest pain, pressure, nausea, vomitting, sweating, nasal drainage, headache Frequency: ongoing, worse last night when I actually thought I was having a heart attack but still ongoing Pertinent Negatives: Patient denies SOB Disposition: [x] ED /[] Urgent Care (no appt availability in office) / [] Appointment(In office/virtual)/ []  [facility]/ [] Home Care/ [] Refused Recommended Disposition /[] [facility]/ []  Follow-up with PCP Additional Notes: pt c/o chest pain, pressure, n/s, sweating, headache and nasal drainage.  Ongoing - worse last night but still ongoing.  Had episode once before similar and he passed out in the store.  Nurse gave care advice go to ED or call 911.  Pt stated having someone present who could take him to ED and he would be leaving now. Reason for Disposition  SEVERE chest pain  Answer Assessment - Initial Assessment Questions 1. LOCATION: "Where does it hurt?"       Chest pain and pressure 2. RADIATION: "Does the pain go anywhere else?" (e.g., into neck, jaw, arms, back)     Pain in arm and shoulder  3. ONSET: "When did the chest pain begin?" (Minutes, hours or days)      N/a 4. PATTERN: "Does the pain come and go, or has it been constant since it started?"  "Does it get worse with exertion?"      ongoing 5. DURATION: "How long does it last" (e.g., seconds, minutes, hours)     Comes and goes 6. SEVERITY: "How bad is the pain?"  (e.g., Scale 1-10; mild, moderate, or severe)    - MILD (1-3): doesn't interfere with normal activities     - MODERATE (4-7):  interferes with normal activities or awakens from sleep    - SEVERE (8-10): excruciating pain, unable to do any normal activities       Worse last night"I thought I was having a heart attack" still having pain and pressure today 7. CARDIAC RISK FACTORS: "Do you have any history of heart problems or risk factors for heart disease?" (e.g., angina, prior heart attack; diabetes, high blood pressure, high cholesterol, smoker, or strong family history of heart disease)     N/a 8. PULMONARY RISK FACTORS: "Do you have any history of lung disease?"  (e.g., blood clots in lung, asthma, emphysema, birth control pills)     N/a 9. CAUSE: "What do you think is causing the chest pain?"     unknown 10. OTHER SYMPTOMS: "Do you have any other symptoms?" (e.g., dizziness, nausea, vomiting, sweating, fever, difficulty breathing, cough)       Nasal drainage, sweating,nausea, vomiting 11. PREGNANCY: "Is there any chance you are pregnant?" "When was your last menstrual period?"       no  Protocols used: Chest Pain-A-AH

## 2023-06-13 ENCOUNTER — Ambulatory Visit: Payer: Medicaid Other | Admitting: Pharmacist
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
## 2023-06-15 ENCOUNTER — Ambulatory Visit: Payer: 59 | Admitting: Pharmacist

## 2023-06-20 ENCOUNTER — Ambulatory Visit (INDEPENDENT_AMBULATORY_CARE_PROVIDER_SITE_OTHER): Payer: Medicaid Other | Admitting: Family

## 2023-06-20 ENCOUNTER — Encounter: Payer: Self-pay | Admitting: Family

## 2023-06-20 ENCOUNTER — Other Ambulatory Visit: Payer: Self-pay

## 2023-06-20 DIAGNOSIS — A539 Syphilis, unspecified: Secondary | ICD-10-CM | POA: Diagnosis not present

## 2023-06-20 DIAGNOSIS — B20 Human immunodeficiency virus [HIV] disease: Secondary | ICD-10-CM | POA: Diagnosis not present

## 2023-06-20 DIAGNOSIS — Z23 Encounter for immunization: Secondary | ICD-10-CM

## 2023-06-20 DIAGNOSIS — Z0000 Encounter for general adult medical examination without abnormal findings: Secondary | ICD-10-CM | POA: Diagnosis not present

## 2023-06-20 MED ORDER — CABOTEGRAVIR & RILPIVIRINE ER 600 & 900 MG/3ML IM SUER
1.0000 | Freq: Once | INTRAMUSCULAR | Status: AC
  Administered 2023-06-20: 1 via INTRAMUSCULAR

## 2023-06-20 NOTE — Assessment & Plan Note (Signed)
Discussed importance of safe sexual practice and condom use. Condoms and STD testing offered.  Vaccinations reviewed - Prevnar 20 and second dose of Gardasil updated. Will be due for final dose in 6 months. Encouraged to complete routine dental care which he will schedule independently.

## 2023-06-20 NOTE — Progress Notes (Signed)
Brief Narrative   Patient ID: Gary Barrett, male    DOB: [birth year], 32 y.o.   MRN: [id number]  Mr. 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 Initial viral load was 40,500 with CD4 count of 199. Genotype with no significant medication resistant mutations. No history of opportunistic infection. [id number] negative. Entered care at [facility] Stage 3. Had nausea with multiple regimens including Biktarvy, Dovato, Delstrigo, Isentress/Truvada. Now on Cabenuva.   Subjective:    Chief Complaint  Patient presents with   Follow-up    B20-Cabenuva    HPI:  Gary Barrett is a 32 y.o. male with HIV disease last seen on 05/24/2023 by Margarite Gouge, PharmD, CPP with well-controlled virus and good adherence and tolerance to Cabenuva.  Viral load was undetectable with CD4 count 716.  Kidney function, liver function, electrolytes within normal ranges.  RPR previously reactive at 1: 64 and treated for syphilis.  Here today for follow-up.  Gary Barrett has been doing well since his last office visit and continues to have good tolerance of Cabenuva. Has plans to move to [place], [place] after the new year and working out the logistics prior to his move. Currently covered by [employer]. Healthcare maintenance reviewed. Condoms and STD testing offered.    Denies fevers, chills, night sweats, headaches, changes in vision, neck pain/stiffness, nausea, diarrhea, vomiting, lesions or rashes.  Lab Results  Component Value Date   CD4TCELL 36 02/09/2023   CD4TABS 716 02/09/2023   Lab Results  Component Value Date   HIV1RNAQUANT Not Detected 02/09/2023     Allergies  Allergen Reactions   Zofran [Ondansetron Hcl] Other (See Comments)    Constipation. Pt reports that infection disease physician instructed him to d/c.      Outpatient Medications Prior to Visit  Medication Sig Dispense Refill   acetaminophen (TYLENOL) 500 MG tablet Take 2 tablets (1,000 mg  total) by mouth every 6 (six) hours as needed. 120 tablet 0   cabotegravir & rilpivirine ER (CABENUVA) 600 & 900 MG/3ML injection Inject 1 kit into the muscle every 2 (two) months. 6 mL 5   docusate sodium (COLACE) 100 MG capsule Take 1 capsule (100 mg total) by mouth 2 (two) times daily. 10 capsule 2   omeprazole (PRILOSEC) 40 MG capsule Take 1 capsule (40 mg total) by mouth daily. 90 capsule 3   pantoprazole (PROTONIX) 40 MG tablet Take 1 tablet (40 mg total) by mouth daily. 60 tablet 1   promethazine (PHENERGAN) 25 MG tablet Take 1 tablet (25 mg total) by mouth every 8 (eight) hours as needed for nausea or vomiting. 20 tablet 0   No facility-administered medications prior to visit.     Past Medical History:  Diagnosis Date   HIV (human immunodeficiency virus infection) (HCC)      Past Surgical History:  Procedure Laterality Date   ANAL FISTULOTOMY     BIOPSY  02/08/2022   Procedure: BIOPSY;  Surgeon: Lanelle Bal, DO;  [facility];  Service: Endoscopy;;   ESOPHAGOGASTRODUODENOSCOPY (EGD) WITH PROPOFOL N/A 02/08/2022   Procedure: ESOPHAGOGASTRODUODENOSCOPY (EGD) WITH PROPOFOL;  Surgeon: Lanelle Bal, DO;  Location: [facility];  Service: Endoscopy;  Laterality: N/A;  1:00pm   INNER EAR SURGERY        Review of Systems  Constitutional:  Negative for appetite change, chills, fatigue, fever and unexpected weight change.  Eyes:  Negative for visual disturbance.  Respiratory:  Negative for cough, chest tightness, shortness of breath and wheezing.   Cardiovascular:  Negative for chest pain and leg swelling.  Gastrointestinal:  Negative for abdominal pain, constipation, diarrhea, nausea and vomiting.  Genitourinary:  Negative for dysuria, flank pain, frequency, genital sores, hematuria and urgency.  Skin:  Negative for rash.  Allergic/Immunologic: Negative for immunocompromised state.  Neurological:  Negative for dizziness and headaches.      Objective:     BP 133/79   Pulse 92   Temp (!) 96.8 F (36 C) (Temporal)   Wt 171 lb (77.6 kg)   BMI 26.00 kg/m  Nursing note and vital signs reviewed.  Physical Exam Constitutional:      General: He is not in acute distress.    Appearance: He is well-developed.  Eyes:     Conjunctiva/sclera: Conjunctivae normal.  Cardiovascular:     Rate and Rhythm: Normal rate and regular rhythm.     Heart sounds: Normal heart sounds. No murmur heard.    No friction rub. No gallop.  Pulmonary:     Effort: Pulmonary effort is normal. No respiratory distress.     Breath sounds: Normal breath sounds. No wheezing or rales.  Chest:     Chest wall: No tenderness.  Abdominal:     General: Bowel sounds are normal.     Palpations: Abdomen is soft.     Tenderness: There is no abdominal tenderness.  Musculoskeletal:     Cervical back: Neck supple.  Lymphadenopathy:     Cervical: No cervical adenopathy.  Skin:    General: Skin is warm and dry.     Findings: No rash.  Neurological:     Mental Status: He is alert and oriented to person, place, and time.  Psychiatric:        Behavior: Behavior normal.        Thought Content: Thought content normal.        Judgment: Judgment normal.         06/20/2023    2:32 PM 04/14/2023    8:10 AM 10/17/2022    8:13 AM 10/13/2022    8:58 AM 07/15/2022    9:28 AM  Depression screen PHQ 2/9  Decreased Interest 0 1 0 0 2  Down, Depressed, Hopeless 0 2 0 0 1  PHQ - 2 Score 0 3 0 0 3  Altered sleeping  2 0  1  Tired, decreased energy  2 0  1  Change in appetite  3 0  2  Feeling bad or failure about yourself   2 0  2  Trouble concentrating  1 0  1  Moving slowly or fidgety/restless  2 0  1  Suicidal thoughts  0 0  1  PHQ-9 Score  15 0  12  Difficult doing work/chores  Extremely dIfficult Not difficult at all  Somewhat difficult       Assessment & Plan:    Patient Active Problem List   Diagnosis Date Noted   Traumatic injury of head 04/14/2023   GERD  (gastroesophageal reflux disease) 04/14/2023   Nausea 04/14/2023   Chlamydia 02/09/2023   Encounter for annual general medical examination with abnormal findings in adult 10/17/2022   Frequency of urination 10/17/2022   Need for RSV vaccination 07/15/2022   Syphilis 06/20/2022   Family history of colon cancer 05/02/2022   Anal pain 05/02/2022   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 Reviewed previous lab work and discussed plan of care and U equals U. Discussed logistics of moving to [place] with recommendation to get next dose of Cabenuva before transferring. Q 2 month injection provided. Plan for follow up in 2 months or sooner if needed.       Relevant Orders   HIV-1 RNA quant-no reflex-bld   T-helper cell (CD4)- ([facility] clinic only)   Healthcare maintenance    Discussed importance of safe sexual practice and condom use. Condoms and STD testing offered.  Vaccinations reviewed - Prevnar 20 and second dose of Gardasil updated. Will be due for final dose in 6 months. Encouraged to complete routine dental care which he will schedule independently.       Syphilis    Syphilis titer of 1:64 and treated with Bicillin. Check RPR.      Relevant Orders   RPR   Other Visit Diagnoses     Need for pneumococcal 20-valent conjugate vaccination       Relevant Orders   Pneumococcal conjugate vaccine 20-valent (Prevnar-20) (Completed)    Need for HPV vaccination       Relevant Orders   HPV 9-valent vaccine,Recombinat (Completed)        I am having Gary Barrett. Gary Barrett maintain his omeprazole, docusate sodium, acetaminophen, cabotegravir & rilpivirine ER, pantoprazole, and promethazine. We administered cabotegravir & rilpivirine ER.   Meds ordered this encounter  Medications   cabotegravir & rilpivirine ER (CABENUVA) 600 & 900 MG/3ML injection 1 kit     Follow-up: Return in about 2 months (around 08/20/2023). or sooner if needed.    Marcos Eke, MSN, FNP-C Nurse Practitioner [facility] Main number: [phone]

## 2023-06-20 NOTE — Assessment & Plan Note (Signed)
Gary Barrett continues to have well controlled virus with good adherence and tolerance to [place]. Reviewed previous lab work and discussed plan of care and U equals U. Discussed logistics of moving to [place] with recommendation to get next dose of Cabenuva before transferring. Q 2 month injection provided. Plan for follow up in 2 months or sooner if needed.

## 2023-06-20 NOTE — Assessment & Plan Note (Signed)
Syphilis titer of 1:64 and treated with Bicillin. Check RPR.

## 2023-06-20 NOTE — Patient Instructions (Addendum)
Nice to see you.  We will check your lab work today.  Continue to take your medication daily as prescribed.  Have a great day and stay safe!

## 2023-06-21 LAB — T-HELPER CELL (CD4) - (RCID CLINIC ONLY)
CD4 % Helper T Cell: 38 % (ref 33–65)
CD4 T Cell Abs: 670 /uL (ref 400–1790)

## 2023-06-25 DIAGNOSIS — Z419 Encounter for procedure for purposes other than remedying health state, unspecified: Secondary | ICD-10-CM | POA: Diagnosis not present

## 2023-06-27 LAB — HIV-1 RNA QUANT-NO REFLEX-BLD
HIV 1 RNA Quant: NOT DETECTED {copies}/mL
HIV-1 RNA Quant, Log: NOT DETECTED {Log_copies}/mL

## 2023-06-27 LAB — RPR TITER: RPR Titer: 1:32 {titer} — ABNORMAL HIGH

## 2023-06-27 LAB — RPR: RPR Ser Ql: REACTIVE — AB

## 2023-06-27 LAB — T PALLIDUM AB: T Pallidum Abs: POSITIVE — AB

## 2023-07-26 DIAGNOSIS — Z419 Encounter for procedure for purposes other than remedying health state, unspecified: Secondary | ICD-10-CM | POA: Diagnosis not present

## 2023-08-16 ENCOUNTER — Other Ambulatory Visit: Payer: Self-pay

## 2023-08-16 ENCOUNTER — Encounter: Payer: Self-pay | Admitting: Family

## 2023-08-16 ENCOUNTER — Ambulatory Visit (INDEPENDENT_AMBULATORY_CARE_PROVIDER_SITE_OTHER): Payer: Medicaid Other | Admitting: Family

## 2023-08-16 VITALS — BP 127/79 | HR 95 | Temp 99.0°F | Resp 16 | Wt 176.0 lb

## 2023-08-16 DIAGNOSIS — Z0000 Encounter for general adult medical examination without abnormal findings: Secondary | ICD-10-CM

## 2023-08-16 DIAGNOSIS — B20 Human immunodeficiency virus [HIV] disease: Secondary | ICD-10-CM | POA: Diagnosis not present

## 2023-08-16 DIAGNOSIS — Z23 Encounter for immunization: Secondary | ICD-10-CM | POA: Diagnosis not present

## 2023-08-16 MED ORDER — CABOTEGRAVIR & RILPIVIRINE ER 600 & 900 MG/3ML IM SUER
1.0000 | Freq: Once | INTRAMUSCULAR | Status: AC
  Administered 2023-08-16: 1 via INTRAMUSCULAR

## 2023-08-16 NOTE — Progress Notes (Signed)
Brief Narrative   Patient ID: Gary Barrett, male    DOB: 07/29/1990, 33 y.o.   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 Initial viral load was 40,500 with CD4 count of 199. Genotype with no significant medication resistant mutations. No history of opportunistic infection. [id number] negative. Entered care at [facility] Stage 3. Had nausea with multiple regimens including Biktarvy, Dovato, Delstrigo, Isentress/Truvada. Now on Cabenuva.   Subjective:    Chief Complaint  Patient presents with   Follow-up    B20     HPI:  Gary Barrett is a 33 y.o. male with HIV disease last seen on 06/20/2023 with well-controlled virus and good adherence and tolerance to [place].  Viral load was undetectable with CD4 count 670.  RPR titer was down to 1: 32.  Here today for routine follow-up and next injection.  Gary Barrett has been doing well since his last office visit with no new concerns/complaints and continues to tolerate Cabenuva with no adverse side effects. Condoms and site specific STD testing offered. Healthcare maintenance reviewed. Routine dental care up to date.   Denies fevers, chills, night sweats, headaches, changes in vision, neck pain/stiffness, nausea, diarrhea, vomiting, lesions or rashes.  Lab Results  Component Value Date   CD4TCELL 38 06/20/2023   CD4TABS 670 06/20/2023   Lab Results  Component Value Date   HIV1RNAQUANT Not Detected 06/20/2023     Allergies  Allergen Reactions   Zofran [Ondansetron Hcl] Other (See Comments)    Constipation. Pt reports that infection disease physician instructed him to d/c.      Outpatient Medications Prior to Visit  Medication Sig Dispense Refill   acetaminophen (TYLENOL) 500 MG tablet Take 2 tablets (1,000 mg total) by mouth every 6 (six) hours as needed. 120 tablet 0   cabotegravir & rilpivirine ER (CABENUVA) 600 & 900 MG/3ML injection Inject 1 kit into  the muscle every 2 (two) months. 6 mL 5   docusate sodium (COLACE) 100 MG capsule Take 1 capsule (100 mg total) by mouth 2 (two) times daily. 10 capsule 2   omeprazole (PRILOSEC) 40 MG capsule Take 1 capsule (40 mg total) by mouth daily. 90 capsule 3   pantoprazole (PROTONIX) 40 MG tablet Take 1 tablet (40 mg total) by mouth daily. 60 tablet 1   promethazine (PHENERGAN) 25 MG tablet Take 1 tablet (25 mg total) by mouth every 8 (eight) hours as needed for nausea or vomiting. 20 tablet 0   No facility-administered medications prior to visit.     Past Medical History:  Diagnosis Date   HIV (human immunodeficiency virus infection) (HCC)      Past Surgical History:  Procedure Laterality Date   ANAL FISTULOTOMY     BIOPSY  02/08/2022   Procedure: BIOPSY;  Surgeon: Lanelle Bal, DO;  [facility];  Service: Endoscopy;;   ESOPHAGOGASTRODUODENOSCOPY (EGD) WITH PROPOFOL N/A 02/08/2022   Procedure: ESOPHAGOGASTRODUODENOSCOPY (EGD) WITH PROPOFOL;  Surgeon: Lanelle Bal, DO;  [facility];  Service: Endoscopy;  Laterality: N/A;  1:00pm   INNER EAR SURGERY        Review of Systems  Constitutional:  Negative for appetite change, chills, fatigue, fever and unexpected weight change.  Eyes:  Negative for visual disturbance.  Respiratory:  Negative for cough, chest tightness, shortness of breath and wheezing.   Cardiovascular:  Negative for chest pain and leg swelling.  Gastrointestinal:  Negative for abdominal pain, constipation, diarrhea, nausea and vomiting.  Genitourinary:  Negative for dysuria, flank pain, frequency, genital sores, hematuria and urgency.  Skin:  Negative for rash.  Allergic/Immunologic: Negative for immunocompromised state.  Neurological:  Negative for dizziness and headaches.      Objective:    BP 127/79   Pulse 95   Temp 99 F (37.2 C) (Temporal)   Resp 16   Wt 176 lb (79.8 kg)   SpO2 99%   BMI 26.76 kg/m  Nursing note and vital  signs reviewed.  Physical Exam Constitutional:      General: He is not in acute distress.    Appearance: He is well-developed.  Eyes:     Conjunctiva/sclera: Conjunctivae normal.  Cardiovascular:     Rate and Rhythm: Normal rate and regular rhythm.     Heart sounds: Normal heart sounds. No murmur heard.    No friction rub. No gallop.  Pulmonary:     Effort: Pulmonary effort is normal. No respiratory distress.     Breath sounds: Normal breath sounds. No wheezing or rales.  Chest:     Chest wall: No tenderness.  Abdominal:     General: Bowel sounds are normal.     Palpations: Abdomen is soft.     Tenderness: There is no abdominal tenderness.  Musculoskeletal:     Cervical back: Neck supple.  Lymphadenopathy:     Cervical: No cervical adenopathy.  Skin:    General: Skin is warm and dry.     Findings: No rash.  Neurological:     Mental Status: He is alert and oriented to person, place, and time.  Psychiatric:        Behavior: Behavior normal.        Thought Content: Thought content normal.        Judgment: Judgment normal.         08/16/2023    3:06 PM 06/20/2023    2:32 PM 04/14/2023    8:10 AM 10/17/2022    8:13 AM 10/13/2022    8:58 AM  Depression screen PHQ 2/9  Decreased Interest 0 0 1 0 0  Down, Depressed, Hopeless 0 0 2 0 0  PHQ - 2 Score 0 0 3 0 0  Altered sleeping   2 0   Tired, decreased energy   2 0   Change in appetite   3 0   Feeling bad or failure about yourself    2 0   Trouble concentrating   1 0   Moving slowly or fidgety/restless   2 0   Suicidal thoughts   0 0   PHQ-9 Score   15 0   Difficult doing work/chores   Extremely dIfficult Not difficult at all        Assessment & Plan:    Patient Active Problem List   Diagnosis Date Noted   Traumatic injury of head 04/14/2023   GERD (gastroesophageal reflux disease) 04/14/2023   Nausea 04/14/2023   Chlamydia 02/09/2023   Encounter for annual general medical examination with abnormal findings in  adult 10/17/2022   Frequency of urination 10/17/2022   Need for RSV vaccination 07/15/2022   Syphilis 06/20/2022   Family history of colon cancer 05/02/2022   Anal pain 05/02/2022   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 Lordan continues to have well-controlled virus with good adherence and tolerance to [place].  Reviewed previous lab work and discussed plan of care and U equals U.  Continue current dose of Cabenuva.  Plan for follow-up in 6 months or sooner if needed with pharmacy provider in between.      Healthcare maintenance   Discussed importance of safe sexual practice and condom use. Condoms and site specific STD testing offered.  Vaccinations reviewed and Menveo updated. Routine dental care up-to-date per recommendations.      Other Visit Diagnoses       Need for meningitis vaccination       Relevant Orders   MENINGOCOCCAL MCV4O(MENVEO) (Completed)        I am having Madlyn Frankel. Sayler maintain his omeprazole, docusate sodium, acetaminophen, cabotegravir & rilpivirine ER, pantoprazole, and promethazine. We administered cabotegravir & rilpivirine ER.   Meds ordered this encounter  Medications   cabotegravir & rilpivirine ER (CABENUVA) 600 & 900 MG/3ML injection 1 kit     Follow-up: Return in about 6 months (around 02/13/2024). or sooner if needed.    Marcos Eke, MSN, FNP-C Nurse Practitioner [facility] Main number:  [phone]

## 2023-08-16 NOTE — Assessment & Plan Note (Signed)
Gary Barrett continues to have well-controlled virus with good adherence and tolerance to [place].  Reviewed previous lab work and discussed plan of care and U equals U.  Continue current dose of Cabenuva.  Plan for follow-up in 6 months or sooner if needed with pharmacy provider in between.

## 2023-08-16 NOTE — Patient Instructions (Signed)
Nice to see you.  We will check your lab work today.  Plan for follow up in 6 months or sooner if needed with lab work on the same and with pharmacy provider in between.  Have a great day and stay safe!

## 2023-08-16 NOTE — Assessment & Plan Note (Signed)
Discussed importance of safe sexual practice and condom use. Condoms and site specific STD testing offered.  Vaccinations reviewed and Menveo updated. Routine dental care up-to-date per recommendations.

## 2023-08-17 ENCOUNTER — Other Ambulatory Visit: Payer: Self-pay | Admitting: *Deleted

## 2023-08-17 ENCOUNTER — Encounter: Payer: Self-pay | Admitting: Internal Medicine

## 2023-08-17 ENCOUNTER — Encounter: Payer: Self-pay | Admitting: *Deleted

## 2023-08-17 ENCOUNTER — Encounter: Payer: Medicaid Other | Admitting: Family

## 2023-08-17 ENCOUNTER — Ambulatory Visit (INDEPENDENT_AMBULATORY_CARE_PROVIDER_SITE_OTHER): Payer: Medicaid Other | Admitting: Internal Medicine

## 2023-08-17 VITALS — BP 134/83 | HR 84 | Temp 98.2°F | Ht 70.0 in | Wt 176.6 lb

## 2023-08-17 DIAGNOSIS — Z8719 Personal history of other diseases of the digestive system: Secondary | ICD-10-CM

## 2023-08-17 DIAGNOSIS — Z800 Family history of malignant neoplasm of digestive organs: Secondary | ICD-10-CM

## 2023-08-17 DIAGNOSIS — K219 Gastro-esophageal reflux disease without esophagitis: Secondary | ICD-10-CM

## 2023-08-17 DIAGNOSIS — Z9889 Other specified postprocedural states: Secondary | ICD-10-CM

## 2023-08-17 MED ORDER — CLENPIQ 10-3.5-12 MG-GM -GM/175ML PO SOLN
1.0000 | ORAL | 0 refills | Status: DC
Start: 2023-08-17 — End: 2023-08-22

## 2023-08-17 MED ORDER — PANTOPRAZOLE SODIUM 40 MG PO TBEC: 40.0000 mg | DELAYED_RELEASE_TABLET | Freq: Every day | ORAL | 3 refills | Status: AC

## 2023-08-17 NOTE — Patient Instructions (Signed)
We will schedule you for colonoscopy given your family history of colon cancer.  Recommend reaching out to Dr. Gordy Savers office for follow-up visit.  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  It was very nice seeing you again today.  Dr. Marletta Lor

## 2023-08-17 NOTE — Progress Notes (Signed)
Primary Care Physician:  Gilmore Laroche, FNP Primary Gastroenterologist:  Dr. Marletta Lor  Chief Complaint  Patient presents with   Follow-up    Follow up for colonoscopy visit    HPI:   Gary Barrett is a 33 y.o. male who presents as a new patient for follow-up visit.   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  At that time was having significant rectal pain, mucus discharge, consistent with fistula.  Evaluated by Dr. Michaell Cowing of colorectal surgery, underwent anorectal examination under anesthesia with LIFT repair of left posterior intersphincteric fistula, hemorrhoid ligation and pexy/hemorrhoidectomy times 08/25/1922.  Pathology: Fistula tract external with condylomatous change and squamous mucosa. Internal tract with some high-grade dysplasia. Right anterior external hemorrhoidal tag without dysplasia. 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   Supposed to follow-up with Dr. Michaell Cowing in April 2024 but was a no-show according to chart.  Patient states he was unaware that he needed to follow-up.  States he feels 100% better.  No rectal pain.  No rectal bleeding.  No mucus discharge.  Chronic GERD: Well-controlled on pantoprazole daily.  No dysphagia odynophagia.  No epigastric or chest pain.  EGD 02/08/22 showed gastritis and evidence of acid reflux, gastric biopsies negative for H. Pylori.  Family history of colon cancer: States his mother was diagnosed with colon cancer at age 55.  Also with 1 grandmother with colon cancer.  No prior colonoscopy.  Past Medical History:  Diagnosis Date   HIV (human immunodeficiency virus infection) (HCC)     Past Surgical History:  Procedure Laterality Date   ANAL FISTULOTOMY     BIOPSY  02/08/2022   Procedure: BIOPSY;  Surgeon: Lanelle Bal, DO;  Location: [facility];  Service: Endoscopy;;   ESOPHAGOGASTRODUODENOSCOPY (EGD) WITH PROPOFOL N/A 02/08/2022   Procedure:  ESOPHAGOGASTRODUODENOSCOPY (EGD) WITH PROPOFOL;  Surgeon: Lanelle Bal, DO;  Location: [facility];  Service: Endoscopy;  Laterality: N/A;  1:00pm   INNER EAR SURGERY      Current Outpatient Medications  Medication Sig Dispense Refill   acetaminophen (TYLENOL) 500 MG tablet Take 2 tablets (1,000 mg total) by mouth every 6 (six) hours as needed. 120 tablet 0   cabotegravir & rilpivirine ER (CABENUVA) 600 & 900 MG/3ML injection Inject 1 kit into the muscle every 2 (two) months. 6 mL 5   docusate sodium (COLACE) 100 MG capsule Take 1 capsule (100 mg total) by mouth 2 (two) times daily. 10 capsule 2   pantoprazole (PROTONIX) 40 MG tablet Take 1 tablet (40 mg total) by mouth daily. 60 tablet 1   promethazine (PHENERGAN) 25 MG tablet Take 1 tablet (25 mg total) by mouth every 8 (eight) hours as needed for nausea or vomiting. 20 tablet 0   omeprazole (PRILOSEC) 40 MG capsule Take 1 capsule (40 mg total) by mouth daily. (Patient not taking: Reported on 08/17/2023) 90 capsule 3   No current facility-administered medications for this visit.    Allergies as of 08/17/2023 - Review Complete 08/17/2023  Allergen Reaction Noted   Zofran [ondansetron hcl] Other (See Comments) 08/13/2022    Family History  Problem Relation Age of Onset   Heart disease Mother    CAD Mother    Colon cancer Maternal Grandmother    Sudden death Cousin     Social History   Socioeconomic History   Marital status: Single    Spouse name: Not on file   Number of children: Not on file  Years of education: 47   Highest education level: Not on file  Occupational History   Not on file  Tobacco Use   Smoking status: Some Days    Current packs/day: 0.10    Types: Cigarettes    Passive exposure: Never   Smokeless tobacco: Never   Tobacco comments:    Smokes about 1 pack per week, cutting back  Vaping Use   Vaping status: Never Used  Substance and Sexual Activity   Alcohol use: Not Currently    Alcohol/week:  1.0 standard drink of alcohol    Types: 1 Glasses of wine per week    Comment: occasional   Drug use: Not Currently    Types: Marijuana    Comment: daily - helps with nausea   Sexual activity: Yes    Comment: declined condoms  Other Topics Concern   Not on file  Social History Narrative   Not on file   Social Drivers of Health   Financial Resource Strain: Not on file  Food Insecurity: No Food Insecurity (11/19/2022)   Hunger Vital Sign    Worried About Running Out of Food in the Last Year: Never true    Ran Out of Food in the Last Year: Never true  Transportation Needs: No Transportation Needs (11/19/2022)   PRAPARE - Administrator, Civil Service (Medical): No    Lack of Transportation (Non-Medical): No  Physical Activity: Not on file  Stress: Not on file  Social Connections: Not on file  Intimate Partner Violence: Not At Risk (11/19/2022)   Humiliation, Afraid, Rape, and Kick questionnaire    Fear of Current or Ex-Partner: No    Emotionally Abused: No    Physically Abused: No    Sexually Abused: No    Subjective: Review of Systems  Constitutional:  Negative for chills and fever.  HENT:  Negative for congestion and hearing loss.   Eyes:  Negative for blurred vision and double vision.  Respiratory:  Negative for cough and shortness of breath.   Cardiovascular:  Negative for chest pain and palpitations.  Gastrointestinal:  Positive for blood in stool, constipation, nausea and vomiting. Negative for abdominal pain, diarrhea, heartburn and melena.       Rectal pain  Genitourinary:  Negative for dysuria and urgency.  Musculoskeletal:  Negative for joint pain and myalgias.  Skin:  Negative for itching and rash.  Neurological:  Negative for dizziness and headaches.  Psychiatric/Behavioral:  Negative for depression. The patient is not nervous/anxious.        Objective: BP 134/83   Pulse 84   Temp 98.2 F (36.8 C)   Ht 5\' 10"  (1.778 m)   Wt 176 lb 9.6 oz  (80.1 kg)   BMI 25.34 kg/m  Physical Exam Constitutional:      Appearance: Normal appearance.  HENT:     Head: Normocephalic and atraumatic.  Eyes:     Extraocular Movements: Extraocular movements intact.     Conjunctiva/sclera: Conjunctivae normal.  Cardiovascular:     Rate and Rhythm: Normal rate and regular rhythm.  Pulmonary:     Effort: Pulmonary effort is normal.     Breath sounds: Normal breath sounds.  Abdominal:     General: Bowel sounds are normal.     Palpations: Abdomen is soft.  Musculoskeletal:        General: Normal range of motion.     Cervical back: Normal range of motion and neck supple.  Skin:    General: Skin is  warm.  Neurological:     General: No focal deficit present.     Mental Status: He is alert and oriented to person, place, and time.  Psychiatric:        Mood and Affect: Mood normal.        Behavior: Behavior normal.     Assessment/Plan:  1.  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  Appears to be doing quite well.  Discussed that he needs to follow-up with Dr. Michaell Cowing as he missed his follow-up appointment prior.  He states he will call his office to set this up.  Did have evidence of dysplasia on pathology.  Discussed this with patient today.  According to Dr. Michaell Cowing' note, recommended surveillance:  Surgical (Rectal exam) follow-up after resection of condyloma acuminatum (warts): - every 3 months after wounds healed until negative exam x1, then - every 6 months until negative exan x 1, then - every Year until negative exam x1, then - as needed thereafter   2.  Chronic GERD-well-controlled pantoprazole daily.  Will continue.  Refill today.  3.  Family history of colorectal malignancy-mother diagnosed with colon cancer age 34, based on current guidelines needs colonoscopy now for high risk  screening purposes.  Will schedule today.  The risks including infection, bleed, or perforation as well as benefits, limitations, alternatives and imponderables have been reviewed with the patient. Questions have been answered. All parties agreeable.    08/17/2023 9:18 AM   Disclaimer: This note was dictated with voice recognition software. Similar sounding words can inadvertently be transcribed and may not be corrected upon review.

## 2023-08-17 NOTE — H&P (View-Only) (Signed)
Primary Care Physician:  Gilmore Laroche, FNP Primary Gastroenterologist:  Dr. Marletta Lor  Chief Complaint  Patient presents with   Follow-up    Follow up for colonoscopy visit    HPI:   Gary Barrett is a 33 y.o. male who presents as a new patient for follow-up visit.   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  At that time was having significant rectal pain, mucus discharge, consistent with fistula.  Evaluated by Dr. Michaell Cowing of colorectal surgery, underwent anorectal examination under anesthesia with LIFT repair of left posterior intersphincteric fistula, hemorrhoid ligation and pexy/hemorrhoidectomy times 08/25/1922.  Pathology: Fistula tract external with condylomatous change and squamous mucosa. Internal tract with some high-grade dysplasia. Right anterior external hemorrhoidal tag without dysplasia. 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   Supposed to follow-up with Dr. Michaell Cowing in April 2024 but was a no-show according to chart.  Patient states he was unaware that he needed to follow-up.  States he feels 100% better.  No rectal pain.  No rectal bleeding.  No mucus discharge.  Chronic GERD: Well-controlled on pantoprazole daily.  No dysphagia odynophagia.  No epigastric or chest pain.  EGD 02/08/22 showed gastritis and evidence of acid reflux, gastric biopsies negative for H. Pylori.  Family history of colon cancer: States his mother was diagnosed with colon cancer at age 61.  Also with 1 grandmother with colon cancer.  No prior colonoscopy.  Past Medical History:  Diagnosis Date   HIV (human immunodeficiency virus infection) (HCC)     Past Surgical History:  Procedure Laterality Date   ANAL FISTULOTOMY     BIOPSY  02/08/2022   Procedure: BIOPSY;  Surgeon: Lanelle Bal, DO;  Location: [facility];  Service: Endoscopy;;   ESOPHAGOGASTRODUODENOSCOPY (EGD) WITH PROPOFOL N/A 02/08/2022   Procedure:  ESOPHAGOGASTRODUODENOSCOPY (EGD) WITH PROPOFOL;  Surgeon: Lanelle Bal, DO;  Location: [facility];  Service: Endoscopy;  Laterality: N/A;  1:00pm   INNER EAR SURGERY      Current Outpatient Medications  Medication Sig Dispense Refill   acetaminophen (TYLENOL) 500 MG tablet Take 2 tablets (1,000 mg total) by mouth every 6 (six) hours as needed. 120 tablet 0   cabotegravir & rilpivirine ER (CABENUVA) 600 & 900 MG/3ML injection Inject 1 kit into the muscle every 2 (two) months. 6 mL 5   docusate sodium (COLACE) 100 MG capsule Take 1 capsule (100 mg total) by mouth 2 (two) times daily. 10 capsule 2   pantoprazole (PROTONIX) 40 MG tablet Take 1 tablet (40 mg total) by mouth daily. 60 tablet 1   promethazine (PHENERGAN) 25 MG tablet Take 1 tablet (25 mg total) by mouth every 8 (eight) hours as needed for nausea or vomiting. 20 tablet 0   omeprazole (PRILOSEC) 40 MG capsule Take 1 capsule (40 mg total) by mouth daily. (Patient not taking: Reported on 08/17/2023) 90 capsule 3   No current facility-administered medications for this visit.    Allergies as of 08/17/2023 - Review Complete 08/17/2023  Allergen Reaction Noted   Zofran [ondansetron hcl] Other (See Comments) 08/13/2022    Family History  Problem Relation Age of Onset   Heart disease Mother    CAD Mother    Colon cancer Maternal Grandmother    Sudden death Cousin     Social History   Socioeconomic History   Marital status: Single    Spouse name: Not on file   Number of children: Not on file  Years of education: 55   Highest education level: Not on file  Occupational History   Not on file  Tobacco Use   Smoking status: Some Days    Current packs/day: 0.10    Types: Cigarettes    Passive exposure: Never   Smokeless tobacco: Never   Tobacco comments:    Smokes about 1 pack per week, cutting back  Vaping Use   Vaping status: Never Used  Substance and Sexual Activity   Alcohol use: Not Currently    Alcohol/week:  1.0 standard drink of alcohol    Types: 1 Glasses of wine per week    Comment: occasional   Drug use: Not Currently    Types: Marijuana    Comment: daily - helps with nausea   Sexual activity: Yes    Comment: declined condoms  Other Topics Concern   Not on file  Social History Narrative   Not on file   Social Drivers of Health   Financial Resource Strain: Not on file  Food Insecurity: No Food Insecurity (11/19/2022)   Hunger Vital Sign    Worried About Running Out of Food in the Last Year: Never true    Ran Out of Food in the Last Year: Never true  Transportation Needs: No Transportation Needs (11/19/2022)   PRAPARE - Administrator, Civil Service (Medical): No    Lack of Transportation (Non-Medical): No  Physical Activity: Not on file  Stress: Not on file  Social Connections: Not on file  Intimate Partner Violence: Not At Risk (11/19/2022)   Humiliation, Afraid, Rape, and Kick questionnaire    Fear of Current or Ex-Partner: No    Emotionally Abused: No    Physically Abused: No    Sexually Abused: No    Subjective: Review of Systems  Constitutional:  Negative for chills and fever.  HENT:  Negative for congestion and hearing loss.   Eyes:  Negative for blurred vision and double vision.  Respiratory:  Negative for cough and shortness of breath.   Cardiovascular:  Negative for chest pain and palpitations.  Gastrointestinal:  Positive for blood in stool, constipation, nausea and vomiting. Negative for abdominal pain, diarrhea, heartburn and melena.       Rectal pain  Genitourinary:  Negative for dysuria and urgency.  Musculoskeletal:  Negative for joint pain and myalgias.  Skin:  Negative for itching and rash.  Neurological:  Negative for dizziness and headaches.  Psychiatric/Behavioral:  Negative for depression. The patient is not nervous/anxious.        Objective: BP 134/83   Pulse 84   Temp 98.2 F (36.8 C)   Ht 5\' 10"  (1.778 m)   Wt 176 lb 9.6 oz  (80.1 kg)   BMI 25.34 kg/m  Physical Exam Constitutional:      Appearance: Normal appearance.  HENT:     Head: Normocephalic and atraumatic.  Eyes:     Extraocular Movements: Extraocular movements intact.     Conjunctiva/sclera: Conjunctivae normal.  Cardiovascular:     Rate and Rhythm: Normal rate and regular rhythm.  Pulmonary:     Effort: Pulmonary effort is normal.     Breath sounds: Normal breath sounds.  Abdominal:     General: Bowel sounds are normal.     Palpations: Abdomen is soft.  Musculoskeletal:        General: Normal range of motion.     Cervical back: Normal range of motion and neck supple.  Skin:    General: Skin is  warm.  Neurological:     General: No focal deficit present.     Mental Status: He is alert and oriented to person, place, and time.  Psychiatric:        Mood and Affect: Mood normal.        Behavior: Behavior normal.     Assessment/Plan:  1.  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  Appears to be doing quite well.  Discussed that he needs to follow-up with Dr. Michaell Cowing as he missed his follow-up appointment prior.  He states he will call his office to set this up.  Did have evidence of dysplasia on pathology.  Discussed this with patient today.  According to Dr. Michaell Cowing' note, recommended surveillance:  Surgical (Rectal exam) follow-up after resection of condyloma acuminatum (warts): - every 3 months after wounds healed until negative exam x1, then - every 6 months until negative exan x 1, then - every Year until negative exam x1, then - as needed thereafter   2.  Chronic GERD-well-controlled pantoprazole daily.  Will continue.  Refill today.  3.  Family history of colorectal malignancy-mother diagnosed with colon cancer age 50, based on current guidelines needs colonoscopy now for high risk  screening purposes.  Will schedule today.  The risks including infection, bleed, or perforation as well as benefits, limitations, alternatives and imponderables have been reviewed with the patient. Questions have been answered. All parties agreeable.    08/17/2023 9:18 AM   Disclaimer: This note was dictated with voice recognition software. Similar sounding words can inadvertently be transcribed and may not be corrected upon review.

## 2023-08-18 ENCOUNTER — Ambulatory Visit: Payer: Self-pay | Admitting: Family Medicine

## 2023-08-18 NOTE — Telephone Encounter (Signed)
Called patient--No answer--Left a HIPAA compliant generic voicemail for patient to call back.

## 2023-08-18 NOTE — Telephone Encounter (Signed)
3rd attempt- LVM

## 2023-08-18 NOTE — Telephone Encounter (Signed)
2nd attempt; left voicemail for patient to return call for triage.

## 2023-08-22 ENCOUNTER — Other Ambulatory Visit: Payer: Self-pay

## 2023-08-22 ENCOUNTER — Ambulatory Visit (HOSPITAL_COMMUNITY): Payer: Medicaid Other | Admitting: Anesthesiology

## 2023-08-22 ENCOUNTER — Ambulatory Visit (HOSPITAL_COMMUNITY)
Admission: RE | Admit: 2023-08-22 | Discharge: 2023-08-22 | Disposition: A | Discharge status: Home / Self Care | Payer: Medicaid Other | Attending: Internal Medicine | Admitting: Internal Medicine

## 2023-08-22 ENCOUNTER — Encounter (HOSPITAL_COMMUNITY)
Admission: RE | Disposition: A | Discharge status: Home / Self Care | Payer: Self-pay | Source: Home / Self Care | Attending: Internal Medicine

## 2023-08-22 DIAGNOSIS — Z1211 Encounter for screening for malignant neoplasm of colon: Secondary | ICD-10-CM | POA: Diagnosis not present

## 2023-08-22 DIAGNOSIS — K6389 Other specified diseases of intestine: Secondary | ICD-10-CM | POA: Insufficient documentation

## 2023-08-22 DIAGNOSIS — I10 Essential (primary) hypertension: Secondary | ICD-10-CM | POA: Diagnosis not present

## 2023-08-22 DIAGNOSIS — F17210 Nicotine dependence, cigarettes, uncomplicated: Secondary | ICD-10-CM | POA: Diagnosis not present

## 2023-08-22 DIAGNOSIS — Z800 Family history of malignant neoplasm of digestive organs: Secondary | ICD-10-CM | POA: Insufficient documentation

## 2023-08-22 DIAGNOSIS — K219 Gastro-esophageal reflux disease without esophagitis: Secondary | ICD-10-CM | POA: Diagnosis not present

## 2023-08-22 DIAGNOSIS — Z21 Asymptomatic human immunodeficiency virus [HIV] infection status: Secondary | ICD-10-CM | POA: Diagnosis not present

## 2023-08-22 HISTORY — PX: BIOPSY: SHX5522

## 2023-08-22 HISTORY — PX: COLONOSCOPY WITH PROPOFOL: SHX5780

## 2023-08-22 SURGERY — COLONOSCOPY WITH PROPOFOL
Anesthesia: General

## 2023-08-22 MED ORDER — PROPOFOL 10 MG/ML IV BOLUS
INTRAVENOUS | Status: DC | PRN
  Administered 2023-08-22: 100 mg via INTRAVENOUS

## 2023-08-22 MED ORDER — LACTATED RINGERS IV SOLN: INTRAVENOUS | Status: DC | PRN

## 2023-08-22 MED ORDER — PROPOFOL 500 MG/50ML IV EMUL
INTRAVENOUS | Status: DC | PRN
  Administered 2023-08-22: 200 ug/kg/min via INTRAVENOUS

## 2023-08-22 NOTE — Transfer of Care (Signed)
Immediate Anesthesia Transfer of Care Note  Patient: Gary Barrett  Procedure(s) Performed: COLONOSCOPY WITH PROPOFOL BIOPSY  Patient Location: Short Stay  Anesthesia Type:General  Level of Consciousness: awake, alert , oriented, and patient cooperative  Airway & Oxygen Therapy: Patient Spontanous Breathing  Post-op Assessment: Report given to RN, Post -op Vital signs reviewed and stable, and Patient moving all extremities X 4  Post vital signs: Reviewed and stable  Last Vitals:  Vitals Value Taken Time  BP 116/73 08/22/23 0840  Temp 36.7 C 08/22/23 0840  Pulse 81 08/22/23 0840  Resp 18 08/22/23 0840  SpO2 97 % 08/22/23 0840    Last Pain:  Vitals:   08/22/23 0840  TempSrc: Axillary  PainSc: 0-No pain      Patients Stated Pain Goal: 5 (08/22/23 0736)  Complications: No notable events documented.

## 2023-08-22 NOTE — Discharge Instructions (Addendum)
  Colonoscopy Discharge Instructions  Read the instructions outlined below and refer to this sheet in the next few weeks. These discharge instructions provide you with general information on caring for yourself after you leave the hospital. Your doctor may also give you specific instructions. While your treatment has been planned according to the most current medical practices available, unavoidable complications occasionally occur.   ACTIVITY You may resume your regular activity, but move at a slower pace for the next 24 hours.  Take frequent rest periods for the next 24 hours.  Walking will help get rid of the air and reduce the bloated feeling in your belly (abdomen).  No driving for 24 hours (because of the medicine (anesthesia) used during the test).   Do not sign any important legal documents or operate any machinery for 24 hours (because of the anesthesia used during the test).  NUTRITION Drink plenty of fluids.  You may resume your normal diet as instructed by your doctor.  Begin with a light meal and progress to your normal diet. Heavy or fried foods are harder to digest and may make you feel sick to your stomach (nauseated).  Avoid alcoholic beverages for 24 hours or as instructed.  MEDICATIONS You may resume your normal medications unless your doctor tells you otherwise.  WHAT YOU CAN EXPECT TODAY Some feelings of bloating in the abdomen.  Passage of more gas than usual.  Spotting of blood in your stool or on the toilet paper.  IF YOU HAD POLYPS REMOVED DURING THE COLONOSCOPY: No aspirin products for 7 days or as instructed.  No alcohol for 7 days or as instructed.  Eat a soft diet for the next 24 hours.  FINDING OUT THE RESULTS OF YOUR TEST Not all test results are available during your visit. If your test results are not back during the visit, make an appointment with your caregiver to find out the results. Do not assume everything is normal if you have not heard from your  caregiver or the medical facility. It is important for you to follow up on all of your test results.  SEEK IMMEDIATE MEDICAL ATTENTION IF: You have more than a spotting of blood in your stool.  Your belly is swollen (abdominal distention).  You are nauseated or vomiting.  You have a temperature over 101.  You have abdominal pain or discomfort that is severe or gets worse throughout the day.   Your colonoscopy was relatively unremarkable.  I did not find any polyps or evidence of colon cancer.  I recommend repeating colonoscopy in 5 years for colon cancer screening purposes given your family history of colon cancer.    The end portion of your small bowel called the terminal ileum appeared somewhat nodular.  I did take samples of this given the history of fistula to rule out Crohn's disease.  We will call with these results.  Scar noted in your rectum from prior surgery.  Recommend continued follow-up with Dr. Michaell Cowing.  Follow-up with me in 6 months or sooner if needed. A message was sent to the office about this appointment.    I hope you have a great rest of your week!  Hennie Duos. Marletta Lor, D.O. Gastroenterology and Hepatology [facility]

## 2023-08-22 NOTE — Interval H&P Note (Signed)
History and Physical Interval Note:  08/22/2023 8:19 AM  Gary Barrett  has presented today for surgery, with the diagnosis of FAMILY HX COLON CANCER.  The various methods of treatment have been discussed with the patient and family. After consideration of risks, benefits and other options for treatment, the patient has consented to  Procedure(s) with comments: COLONOSCOPY WITH PROPOFOL (N/A) - 830AM,A SA 1 as a surgical intervention.  The patient's history has been reviewed, patient examined, no change in status, stable for surgery.  I have reviewed the patient's chart and labs.  Questions were answered to the patient's satisfaction.     Lanelle Bal

## 2023-08-22 NOTE — Anesthesia Preprocedure Evaluation (Addendum)
Anesthesia Evaluation  Patient identified by MRN, date of birth, ID band Patient awake    Reviewed: Allergy & Precautions, H&P , NPO status , Patient's Chart, lab work & pertinent test results, reviewed documented beta blocker date and time   Airway Mallampati: II  TM Distance: >3 FB Neck ROM: full    Dental no notable dental hx.    Pulmonary Current Smoker   Pulmonary exam normal breath sounds clear to auscultation       Cardiovascular Exercise Tolerance: Good hypertension, negative cardio ROS  Rhythm:regular Rate:Normal     Neuro/Psych negative neurological ROS  negative psych ROS   GI/Hepatic negative GI ROS, Neg liver ROS,GERD  ,,  Endo/Other  negative endocrine ROS    Renal/GU negative Renal ROS  negative genitourinary   Musculoskeletal   Abdominal   Peds  Hematology negative hematology ROS (+) HIV  Anesthesia Other Findings   Reproductive/Obstetrics negative OB ROS                              Anesthesia Physical Anesthesia Plan  ASA: 3  Anesthesia Plan: General   Post-op Pain Management:    Induction:   PONV Risk Score and Plan: Propofol infusion  Airway Management Planned:   Additional Equipment:   Intra-op Plan:   Post-operative Plan:   Informed Consent: I have reviewed the patients History and Physical, chart, labs and discussed the procedure including the risks, benefits and alternatives for the proposed anesthesia with the patient or authorized representative who has indicated his/her understanding and acceptance.     Dental Advisory Given  Plan Discussed with: CRNA  Anesthesia Plan Comments:         Anesthesia Quick Evaluation

## 2023-08-22 NOTE — Op Note (Signed)
[facility] Patient Name: Gary Barrett Procedure Date: 08/22/2023 7:53 AM MRN: [id number] Date of Birth: 02-19-1991 Attending MD: Hennie Duos. Marletta Lor , [place], [id number] CSN: [id number] Age: 33 Admit Type: Outpatient Procedure:                Colonoscopy Indications:              Colon cancer screening in patient at increased                            risk: Colorectal cancer in mother AT AGE 38 Providers:                Hennie Duos. Marletta Lor, DO, Crystal Page, Durwin Glaze Tech, Technician Referring MD:              Medicines:                See the Anesthesia note for documentation of the                            administered medications Complications:            No immediate complications. Estimated Blood Loss:     Estimated blood loss was minimal. Procedure:                Pre-Anesthesia Assessment:                           - The anesthesia plan was to use monitored                            anesthesia care (MAC).                           After obtaining informed consent, the colonoscope                            was passed under direct vision. Throughout the                            procedure, the patient's blood pressure, pulse, and                            oxygen saturations were monitored continuously. The                            [id number]-[id number] ([id number]) scope was introduced through                            the anus and advanced to the the terminal ileum,                            with identification of the appendiceal orifice and                            IC  valve. The colonoscopy was performed without                            difficulty. The patient tolerated the procedure                            well. The quality of the bowel preparation was                            evaluated using the BBPS [facility] Bowel Preparation                            Scale) with scores of: Right Colon = 3, Transverse                             Colon = 3 and Left Colon = 3 (entire mucosa seen                            well with no residual staining, small fragments of                            stool or opaque liquid). The total BBPS score                            equals 9. Scope In: 8:28:41 AM Scope Out: 8:37:26 AM Scope Withdrawal Time: 0 hours 6 minutes 27 seconds  Total Procedure Duration: 0 hours 8 minutes 45 seconds  Findings:      A scar from LIFT repair was found in the rectum. Healthy appearing.      A localized area of mucosa in the terminal ileum was nodular. Biopsies       were taken with a cold forceps for histology.      The exam was otherwise without abnormality. Impression:               - Nodular ileal mucosa. Biopsied.                           - The examination was otherwise normal. Moderate Sedation:      Per Anesthesia Care Recommendation:           - Patient has a contact number available for                            emergencies. The signs and symptoms of potential                            delayed complications were discussed with the                            patient. Return to normal activities tomorrow.                            Written discharge instructions were provided to the  patient.                           - Resume previous diet.                           - Continue present medications.                           - Await pathology results.                           - Repeat colonoscopy in 5 years for screening                            purposes.                           - Return to GI clinic in 6 months. Procedure Code(s):        --- Professional ---                           [phone], Colonoscopy, flexible; with biopsy, single                            or multiple Diagnosis Code(s):        --- Professional ---                           Z80.0, Family history of malignant neoplasm of                            digestive organs                            K63.89, Other specified diseases of intestine CPT copyright 2022 American Medical Association. All rights reserved. The codes documented in this report are preliminary and upon coder review may  be revised to meet current compliance requirements. Hennie Duos. Marletta Lor, DO Hennie Duos. Marletta Lor, DO 08/22/2023 8:43:23 AM This report has been signed electronically. Number of Addenda: 0

## 2023-08-23 ENCOUNTER — Encounter (HOSPITAL_COMMUNITY): Payer: Self-pay | Admitting: Internal Medicine

## 2023-08-23 LAB — SURGICAL PATHOLOGY

## 2023-08-23 NOTE — Anesthesia Postprocedure Evaluation (Signed)
Anesthesia Post Note  Patient: Gary Barrett  Procedure(s) Performed: COLONOSCOPY WITH PROPOFOL BIOPSY  Patient location during evaluation: Phase II Anesthesia Type: General Level of consciousness: awake Pain management: pain level controlled Vital Signs Assessment: post-procedure vital signs reviewed and stable Respiratory status: spontaneous breathing and respiratory function stable Cardiovascular status: blood pressure returned to baseline and stable Postop Assessment: no headache and no apparent nausea or vomiting Anesthetic complications: no Comments: Late entry   No notable events documented.   Last Vitals:  Vitals:   08/22/23 0736 08/22/23 0840  BP:  116/73  Pulse: 90 81  Resp: 13 18  Temp: 36.9 C 36.7 C  SpO2: 97% 97%    Last Pain:  Vitals:   08/22/23 0840  TempSrc: Axillary  PainSc: 0-No pain                 Windell Norfolk

## 2023-09-04 ENCOUNTER — Telehealth: Payer: Self-pay

## 2023-09-04 NOTE — Telephone Encounter (Signed)
 I returned call to update patient and left a voicemail requesting a call back to explain.

## 2023-09-04 NOTE — Telephone Encounter (Signed)
 Gary Barrett left a voicemail to reschedule his upcoming appointment due to a lapse in insurance. His current insurance ends 2/28 and his new insurance will start 4/1. His target date is between the 16th and 30th. Per Mylinda Asa, she will run his insurance on 3/1 for a term date to apply for assistance during transition of insurance.

## 2023-09-14 ENCOUNTER — Ambulatory Visit: Payer: 59 | Admitting: Family Medicine

## 2023-09-15 ENCOUNTER — Ambulatory Visit: Payer: 59 | Admitting: Family Medicine

## 2023-09-25 ENCOUNTER — Other Ambulatory Visit (HOSPITAL_COMMUNITY): Payer: Self-pay

## 2023-09-26 ENCOUNTER — Other Ambulatory Visit (HOSPITAL_COMMUNITY): Payer: Self-pay

## 2023-10-02 ENCOUNTER — Other Ambulatory Visit (HOSPITAL_COMMUNITY): Payer: Self-pay

## 2023-10-02 ENCOUNTER — Telehealth: Payer: Self-pay

## 2023-10-02 NOTE — Telephone Encounter (Signed)
 [facility] Patient Advocate Encounter  Deanna and  I have been unsuccessful in reaching patient to be able to get income verification to be able to help with insurance.    We have tried multiple times without a response.  Clearance Coots, CPhT Specialty Pharmacy Patient [facility] for Infectious Disease Phone: [phone] Fax:  [phone]

## 2023-10-05 ENCOUNTER — Other Ambulatory Visit (HOSPITAL_COMMUNITY): Payer: Self-pay

## 2023-10-06 NOTE — Progress Notes (Deleted)
 HPI: Gary Barrett is a 33 y.o. male who presents to the [facility] pharmacy clinic for [place] administration.  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   CABOTEGRAVIR & RILPIVIRINE ER (CABENUVA) 600 & 900 MG/3ML INJECTION    Inject 1 kit into the muscle every 2 (two) months.   DOCUSATE SODIUM (COLACE) 100 MG CAPSULE    Take 1 capsule (100 mg total) by mouth 2 (two) times daily.   PANTOPRAZOLE (PROTONIX) 40 MG TABLET    Take 1 tablet (40 mg total) by mouth daily.   PROMETHAZINE (PHENERGAN) 25 MG TABLET    Take 1 tablet (25 mg total) by mouth every 8 (eight) hours as needed for nausea or vomiting.  Modified Medications   No medications on file  Discontinued Medications   No medications on file    Allergies: Allergies  Allergen Reactions   Zofran [Ondansetron Hcl] Other (See Comments)    Constipation. Pt reports that infection disease physician instructed him to d/c.     Past Medical History: Past Medical History:  Diagnosis Date   HIV (human immunodeficiency virus infection) (HCC)     Social History: Social History   Socioeconomic History   Marital status: Single    Spouse name: Not on file   Number of children: Not on file   Years of education: 16   Highest education level: Not on file  Occupational History   Not on file  Tobacco Use   Smoking status: Some Days    Current packs/day: 0.10    Types: Cigarettes    Passive exposure: Never   Smokeless tobacco: Never   Tobacco comments:    Smokes about 1 pack per week, cutting back  Vaping Use   Vaping status: Never Used  Substance and Sexual Activity   Alcohol use: Not Currently    Alcohol/week: 1.0 standard drink of alcohol    Types: 1 Glasses of wine per week    Comment: occasional   Drug use: Not Currently    Types: Marijuana    Comment: daily - helps with nausea   Sexual activity: Yes    Comment: declined condoms  Other Topics Concern   Not on file  Social History Narrative   Not on file   Social Drivers of Health   Financial Resource Strain: Not on file  Food Insecurity: No Food Insecurity (11/19/2022)   Hunger Vital Sign    Worried About Running  Out of Food in the Last Year: Never true    Ran Out of Food in the Last Year: Never true  Transportation Needs: No Transportation Needs (11/19/2022)   PRAPARE - Administrator, Civil Service (Medical): No    Lack of Transportation (Non-Medical): No  Physical Activity: Not on file  Stress: Not on file  Social Connections: Not on file    Labs: Lab Results  Component Value Date   HIV1RNAQUANT Not Detected 06/20/2023   HIV1RNAQUANT Not Detected 02/09/2023   HIV1RNAQUANT <20 (H) 12/14/2022   CD4TABS 670 06/20/2023   CD4TABS 716 02/09/2023   CD4TABS 554 06/20/2022    RPR and STI Lab Results  Component Value Date   LABRPR REACTIVE (A) 06/20/2023   LABRPR REACTIVE (A) 12/14/2022   LABRPR REACTIVE (A)  06/20/2022   LABRPR REACTIVE (A) 10/21/2021   RPRTITER 1:32 (H) 06/20/2023   RPRTITER 1:64 (H) 12/14/2022   RPRTITER 1:128 (H) 06/20/2022   RPRTITER 1:2,048 (H) 10/21/2021    STI Results GC CT  12/14/2022  9:38 AM Negative    Negative    Negative  Positive    Negative    Negative     Hepatitis B Lab Results  Component Value Date   HEPBSAB REACTIVE (A) 10/21/2021   HEPBSAG NON-REACTIVE 10/21/2021   HEPBCAB NON-REACTIVE 10/21/2021   Hepatitis C Lab Results  Component Value Date   HEPCAB NON-REACTIVE 10/21/2021   Hepatitis A Lab Results  Component Value Date   HAV REACTIVE (A) 10/21/2021   Lipids: Lab Results  Component Value Date   CHOL 191 10/17/2022   TRIG 103 10/17/2022   HDL 43 10/17/2022   CHOLHDL 4.4 10/17/2022   VLDL 25 10/18/2021   LDLCALC 129 (H) 10/17/2022    TARGET DATE:  The 23rd of the month  Assessment: Bernhard presents today for their maintenance Cabenuva injections. Initial/past injections were tolerated well without issues. No problems with systemic effects of injections. Currently without insurance; working with Lupita Leash and Jennette Kettle to confirm active coverage. May need to take oral medication until insurance starts back 10/24/23. ***   Administered cabotegravir 600mg /51mL in left upper outer quadrant of the gluteal muscle. Administered rilpivirine 900 mg/29mL in the right upper outer quadrant of the gluteal muscle. Monitored patient for 10 minutes after injection. Injections were tolerated well without issue. Patient will follow up in 2 months for next injection. Will defer HIV RNA testing until next visit as it was recently assessed; will also need routine lipid profile and CBC then.   Plan: - Cabenuva injections administered - Next injections scheduled for *** - Call with any issues or questions  Margarite Gouge, PharmD, CPP, BCIDP, AAHIVP Clinical Pharmacist Practitioner Infectious Diseases Clinical Pharmacist [facility]

## 2023-10-10 ENCOUNTER — Ambulatory Visit: Payer: Self-pay | Admitting: Pharmacist

## 2023-10-16 ENCOUNTER — Telehealth: Payer: Self-pay

## 2023-10-16 ENCOUNTER — Other Ambulatory Visit: Payer: Self-pay | Admitting: Family

## 2023-10-16 MED ORDER — CABOTEGRAVIR & RILPIVIRINE ER 600 & 900 MG/3ML IM SUER: 1.0000 | INTRAMUSCULAR | 5 refills | Status: DC

## 2023-10-16 NOTE — Telephone Encounter (Signed)
 Submitted application for [employer] to [employer] for patient assistance.   Phone: [phone]

## 2023-10-17 ENCOUNTER — Telehealth: Payer: Self-pay

## 2023-10-17 NOTE — Telephone Encounter (Signed)
 [facility] Patient Advocate Encounter  Completed and sent [employer] application for Cabenuva for this patient who is uninsured.    Patient is approved 10/17/23 through 10/16/24.  Patient ID #[id number]  Medication will be shipped to the office .   Clearance Coots, CPhT Specialty Pharmacy Patient [facility] for Infectious Disease Phone: [phone] Fax:  [phone]

## 2023-10-18 NOTE — Progress Notes (Deleted)
 HPI: Gary Barrett is a 33 y.o. male who presents to the [facility] pharmacy clinic for [place] administration.  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   CABOTEGRAVIR & RILPIVIRINE ER (CABENUVA) 600 & 900 MG/3ML INJECTION    Inject 1 kit into the muscle every 2 (two) months.   DOCUSATE SODIUM (COLACE) 100 MG CAPSULE    Take 1 capsule (100 mg total) by mouth 2 (two) times daily.   PANTOPRAZOLE (PROTONIX) 40 MG TABLET    Take 1 tablet (40 mg total) by mouth daily.   PROMETHAZINE (PHENERGAN) 25 MG TABLET    Take 1 tablet (25 mg total) by mouth every 8 (eight) hours as needed for nausea or vomiting.  Modified Medications   No medications on file  Discontinued Medications   No medications on file    Allergies: Allergies  Allergen Reactions   Zofran [Ondansetron Hcl] Other (See Comments)    Constipation. Pt reports that infection disease physician instructed him to d/c.     Past Medical History: Past Medical History:  Diagnosis Date   HIV (human immunodeficiency virus infection) (HCC)     Social History: Social History   Socioeconomic History   Marital status: Single    Spouse name: Not on file   Number of children: Not on file   Years of education: 16   Highest education level: Not on file  Occupational History   Not on file  Tobacco Use   Smoking status: Some Days    Current packs/day: 0.10    Types: Cigarettes    Passive exposure: Never   Smokeless tobacco: Never   Tobacco comments:    Smokes about 1 pack per week, cutting back  Vaping Use   Vaping status: Never Used  Substance and Sexual Activity   Alcohol use: Not Currently    Alcohol/week: 1.0 standard drink of alcohol    Types: 1 Glasses of wine per week    Comment: occasional   Drug use: Not Currently    Types: Marijuana    Comment: daily - helps with nausea   Sexual activity: Yes    Comment: declined condoms  Other Topics Concern   Not on file  Social History Narrative   Not on file   Social Drivers of Health   Financial Resource Strain: Not on file  Food Insecurity: No Food Insecurity (11/19/2022)   Hunger Vital Sign    Worried About Running  Out of Food in the Last Year: Never true    Ran Out of Food in the Last Year: Never true  Transportation Needs: No Transportation Needs (11/19/2022)   PRAPARE - Administrator, Civil Service (Medical): No    Lack of Transportation (Non-Medical): No  Physical Activity: Not on file  Stress: Not on file  Social Connections: Not on file    Labs: Lab Results  Component Value Date   HIV1RNAQUANT Not Detected 06/20/2023   HIV1RNAQUANT Not Detected 02/09/2023   HIV1RNAQUANT <20 (H) 12/14/2022   CD4TABS 670 06/20/2023   CD4TABS 716 02/09/2023   CD4TABS 554 06/20/2022    RPR and STI Lab Results  Component Value Date   LABRPR REACTIVE (A) 06/20/2023   LABRPR REACTIVE (A) 12/14/2022   LABRPR REACTIVE (A)  06/20/2022   LABRPR REACTIVE (A) 10/21/2021   RPRTITER 1:32 (H) 06/20/2023   RPRTITER 1:64 (H) 12/14/2022   RPRTITER 1:128 (H) 06/20/2022   RPRTITER 1:2,048 (H) 10/21/2021    STI Results GC CT  12/14/2022  9:38 AM Negative    Negative    Negative  Positive    Negative    Negative     Hepatitis B Lab Results  Component Value Date   HEPBSAB REACTIVE (A) 10/21/2021   HEPBSAG NON-REACTIVE 10/21/2021   HEPBCAB NON-REACTIVE 10/21/2021   Hepatitis C Lab Results  Component Value Date   HEPCAB NON-REACTIVE 10/21/2021   Hepatitis A Lab Results  Component Value Date   HAV REACTIVE (A) 10/21/2021   Lipids: Lab Results  Component Value Date   CHOL 191 10/17/2022   TRIG 103 10/17/2022   HDL 43 10/17/2022   CHOLHDL 4.4 10/17/2022   VLDL 25 10/18/2021   LDLCALC 129 (H) 10/17/2022    TARGET DATE:  The 23rd of the month  Assessment: Lonald presents today for their maintenance Cabenuva injections. Initial/past injections were tolerated well without issues. No problems with systemic effects of injections. Currently uninsured and now approved through [place] for [place]; will have active insurance starting 10/24/23.   Administered cabotegravir 600mg /3mL in left upper outer quadrant of the gluteal muscle. Administered rilpivirine 900 mg/71mL in the right upper outer quadrant of the gluteal muscle. Monitored patient for 10 minutes after injection. Injections were tolerated well without issue. Patient will follow up in 2 months for next injection. Will defer HIV RNA testing until next visit and also check routine lipid profile and CBC at that time.  Due for 3/3 HPV today which he ***. Politely declines STI testing.  Plan: - Cabenuva injections administered - Administer 3/3 HPV vaccine  - Next injections scheduled for *** - Call with any issues or questions  Margarite Gouge, PharmD, CPP, BCIDP, AAHIVP Clinical Pharmacist Practitioner Infectious Diseases Clinical Pharmacist [facility]

## 2023-10-19 ENCOUNTER — Encounter: Payer: Self-pay | Admitting: Pharmacist

## 2023-10-19 ENCOUNTER — Other Ambulatory Visit: Payer: Self-pay

## 2023-10-19 ENCOUNTER — Ambulatory Visit: Payer: Self-pay | Admitting: Pharmacist

## 2023-10-19 DIAGNOSIS — B20 Human immunodeficiency virus [HIV] disease: Secondary | ICD-10-CM

## 2023-10-19 MED ORDER — CABOTEGRAVIR & RILPIVIRINE ER 600 & 900 MG/3ML IM SUER
1.0000 | Freq: Once | INTRAMUSCULAR | Status: AC
  Administered 2023-10-19: 1 via INTRAMUSCULAR

## 2023-10-19 NOTE — Progress Notes (Signed)
 HPI: Gary Barrett is a 33 y.o. male who presents to the [facility] pharmacy clinic for [place] administration.  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   CABOTEGRAVIR & RILPIVIRINE ER (CABENUVA) 600 & 900 MG/3ML INJECTION    Inject 1 kit into the muscle every 2 (two) months.   DOCUSATE SODIUM (COLACE) 100 MG CAPSULE    Take 1 capsule (100 mg total) by mouth 2 (two) times daily.   PANTOPRAZOLE (PROTONIX) 40 MG TABLET    Take 1 tablet (40 mg total) by mouth daily.   PROMETHAZINE (PHENERGAN) 25 MG TABLET    Take 1 tablet (25 mg total) by mouth every 8 (eight) hours as needed for nausea or vomiting.  Modified Medications   No medications on file  Discontinued Medications   No medications on file    Allergies: Allergies  Allergen Reactions   Zofran [Ondansetron Hcl] Other (See Comments)    Constipation. Pt reports that infection disease physician instructed him to d/c.     Past Medical History: Past Medical History:  Diagnosis Date   HIV (human immunodeficiency virus infection) (HCC)     Social History: Social History   Socioeconomic History   Marital status: Single    Spouse name: Not on file   Number of children: Not on file   Years of education: 16   Highest education level: Not on file  Occupational History   Not on file  Tobacco Use   Smoking status: Some Days    Current packs/day: 0.10    Types: Cigarettes    Passive exposure: Never   Smokeless tobacco: Never   Tobacco comments:    Smokes about 1 pack per week, cutting back  Vaping Use   Vaping status: Never Used  Substance and Sexual Activity   Alcohol use: Not Currently    Alcohol/week: 1.0 standard drink of alcohol    Types: 1 Glasses of wine per week    Comment: occasional   Drug use: Not Currently    Types: Marijuana    Comment: daily - helps with nausea   Sexual activity: Yes    Comment: declined condoms  Other Topics Concern   Not on file  Social History Narrative   Not on file   Social Drivers of Health   Financial Resource Strain: Not on file  Food Insecurity: No Food Insecurity (11/19/2022)   Hunger Vital Sign    Worried About Running  Out of Food in the Last Year: Never true    Ran Out of Food in the Last Year: Never true  Transportation Needs: No Transportation Needs (11/19/2022)   PRAPARE - Administrator, Civil Service (Medical): No    Lack of Transportation (Non-Medical): No  Physical Activity: Not on file  Stress: Not on file  Social Connections: Not on file    Labs: Lab Results  Component Value Date   HIV1RNAQUANT Not Detected 06/20/2023   HIV1RNAQUANT Not Detected 02/09/2023   HIV1RNAQUANT <20 (H) 12/14/2022   CD4TABS 670 06/20/2023   CD4TABS 716 02/09/2023   CD4TABS 554 06/20/2022    RPR and STI Lab Results  Component Value Date   LABRPR REACTIVE (A) 06/20/2023   LABRPR REACTIVE (A) 12/14/2022   LABRPR REACTIVE (A)  06/20/2022   LABRPR REACTIVE (A) 10/21/2021   RPRTITER 1:32 (H) 06/20/2023   RPRTITER 1:64 (H) 12/14/2022   RPRTITER 1:128 (H) 06/20/2022   RPRTITER 1:2,048 (H) 10/21/2021    STI Results GC CT  12/14/2022  9:38 AM Negative    Negative    Negative  Positive    Negative    Negative     Hepatitis B Lab Results  Component Value Date   HEPBSAB REACTIVE (A) 10/21/2021   HEPBSAG NON-REACTIVE 10/21/2021   HEPBCAB NON-REACTIVE 10/21/2021   Hepatitis C Lab Results  Component Value Date   HEPCAB NON-REACTIVE 10/21/2021   Hepatitis A Lab Results  Component Value Date   HAV REACTIVE (A) 10/21/2021   Lipids: Lab Results  Component Value Date   CHOL 191 10/17/2022   TRIG 103 10/17/2022   HDL 43 10/17/2022   CHOLHDL 4.4 10/17/2022   VLDL 25 10/18/2021   LDLCALC 129 (H) 10/17/2022    TARGET DATE:  The 23rd of the month  Assessment: Gary Barrett presents today for their maintenance Cabenuva injections. Initial/past injections were tolerated well without issues. No problems with systemic effects of injections. Currently uninsured and now approved through [place] for [place]; will have active insurance starting ~11/23/23.   Administered cabotegravir 600mg /75mL in left upper outer quadrant of the gluteal muscle. Administered rilpivirine 900 mg/77mL in the right upper outer quadrant of the gluteal muscle. Monitored patient for 10 minutes after injection. Injections were tolerated well without issue. Patient will follow up in 2 months for next injection. Will defer HIV RNA testing until next visit (hopefully when insurance is active again) and also check routine lipid profile and CBC at that time.  Due for 3/3 HPV today which we cannot administer given his lapse in insurance; will consider administering once his insurance is active. Politely declines STI testing. Unable to coordinate 37-month follow with Tammy Sours due to scheduling conflicts; will space out to September visit.   Plan: - Cabenuva  injections administered - Next injections scheduled for 5/19 and 7/29 with Cassie  - Call with any issues or questions  Margarite Gouge, PharmD, CPP, BCIDP, AAHIVP Clinical Pharmacist Practitioner Infectious Diseases Clinical Pharmacist [facility]

## 2023-10-24 ENCOUNTER — Telehealth: Payer: Self-pay

## 2023-10-24 NOTE — Telephone Encounter (Signed)
 [facility] Patient Advocate Encounter  Patient's medications Gary Barrett have been couriered to [facility] from [facility]) and will be administered at the patients appointment on 10/19/23.  Clearance Coots, CPhT Specialty Pharmacy Patient [facility] for Infectious Disease Phone: [phone] Fax:  [phone]

## 2023-11-21 ENCOUNTER — Other Ambulatory Visit (HOSPITAL_COMMUNITY): Payer: Self-pay

## 2023-11-24 ENCOUNTER — Ambulatory Visit: Payer: 59 | Admitting: Family Medicine

## 2023-11-28 ENCOUNTER — Telehealth: Payer: Self-pay

## 2023-11-28 NOTE — Telephone Encounter (Signed)
 [facility] Patient Advocate Encounter  Patient's medications CABENUVA  have been couriered to [facility] from [facility] pharmacy and will be administered at the patients appointment on 12/11/23.  Verline Glow, CPhT Specialty Pharmacy Patient [facility] for Infectious Disease Phone: [phone] Fax:  [phone]

## 2023-11-29 DIAGNOSIS — F418 Other specified anxiety disorders: Secondary | ICD-10-CM | POA: Diagnosis not present

## 2023-11-29 DIAGNOSIS — F4320 Adjustment disorder, unspecified: Secondary | ICD-10-CM | POA: Diagnosis not present

## 2023-12-04 ENCOUNTER — Telehealth: Payer: Self-pay

## 2023-12-04 ENCOUNTER — Other Ambulatory Visit (HOSPITAL_COMMUNITY): Payer: Self-pay

## 2023-12-04 ENCOUNTER — Other Ambulatory Visit: Payer: Self-pay

## 2023-12-04 ENCOUNTER — Other Ambulatory Visit: Payer: Self-pay | Admitting: Pharmacist

## 2023-12-04 DIAGNOSIS — B20 Human immunodeficiency virus [HIV] disease: Secondary | ICD-10-CM

## 2023-12-04 MED ORDER — CABOTEGRAVIR & RILPIVIRINE ER 600 & 900 MG/3ML IM SUER
1.0000 | INTRAMUSCULAR | 5 refills | Status: AC
  Filled 2023-12-04: qty 6, 60d supply, fill #0
  Filled 2024-02-05: qty 6, 60d supply, fill #1
  Filled 2024-04-08: qty 6, 60d supply, fill #2
  Filled 2024-05-30: qty 6, 60d supply, fill #3
  Filled 2024-08-06: qty 6, 60d supply, fill #4

## 2023-12-04 NOTE — Telephone Encounter (Signed)
 Submitted a Prior Authorization request to [employer] for CABENUVA  via CoverMyMeds. Will update once we receive a response.    PA ID: [id number]

## 2023-12-04 NOTE — Progress Notes (Signed)
 Specialty Pharmacy Initial Fill Coordination Note  Gary Barrett is a 33 y.o. male contacted today regarding initial fill of specialty medication(s) Cabotegravir  & Rilpivirine  (CABENUVA )   Patient requested Courier to Provider Office   Delivery date: 12/06/23   Verified address: [address] [place] [address]   Medication will be filled on 10/05/23.   Patient is aware of 0.00 copayment.

## 2023-12-04 NOTE — Telephone Encounter (Signed)
 Pharmacy Patient Advocate Encounter- Cabenuva  BIV-Pharmacy Benefit:  PA was submitted to Cabenuva  and has been approved through: : 12/04/2023- 12/03/2024 Authorization#  [id number]  Please send prescription to Specialty Pharmacy: [name]: [phone]  Estimated Copay is: $4900.00 patient have copay card to make the copay $0.00

## 2023-12-06 ENCOUNTER — Telehealth: Payer: Self-pay

## 2023-12-06 NOTE — Telephone Encounter (Signed)
 [facility] Patient Advocate Encounter  Patient's medications Cabenuva  have been couriered to [facility] from [facility] and will be administered at the patients appointment on 12/11/23.  Roylene Corn, CPhT Specialty Pharmacy Patient [facility] for Infectious Disease Phone: [phone] Fax:  [phone]

## 2023-12-10 NOTE — Progress Notes (Signed)
 HPI: Gary Barrett is a 33 y.o. male who presents to the [facility] pharmacy clinic for Cabenuva  administration.  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   CABOTEGRAVIR  & RILPIVIRINE  ER (CABENUVA ) 600 & 900 MG/3ML INJECTION    Inject 1 kit into the muscle every 2 (two) months.   DOCUSATE SODIUM  (COLACE) 100 MG CAPSULE    Take 1 capsule (100 mg total) by mouth 2 (two) times daily.   PANTOPRAZOLE  (PROTONIX ) 40 MG TABLET    Take 1 tablet (40 mg total) by mouth daily.   PROMETHAZINE  (PHENERGAN ) 25 MG TABLET    Take 1 tablet (25 mg total) by mouth every 8 (eight) hours as needed for nausea or vomiting.  Modified Medications   No medications on file  Discontinued Medications   No medications on file    Allergies: Allergies  Allergen Reactions   Zofran  [Ondansetron  Hcl] Other (See Comments)    Constipation. Pt reports that infection disease physician instructed him to d/c.     Labs: Lab Results  Component Value Date   HIV1RNAQUANT Not Detected 06/20/2023   HIV1RNAQUANT Not Detected 02/09/2023   HIV1RNAQUANT <20 (H) 12/14/2022   CD4TABS 670 06/20/2023   CD4TABS 716 02/09/2023   CD4TABS 554 06/20/2022    RPR and STI Lab Results  Component Value Date   LABRPR REACTIVE (A) 06/20/2023   LABRPR REACTIVE (A) 12/14/2022   LABRPR REACTIVE (A) 06/20/2022   LABRPR REACTIVE (A) 10/21/2021   RPRTITER 1:32 (H) 06/20/2023   RPRTITER 1:64 (H) 12/14/2022   RPRTITER 1:128 (H) 06/20/2022   RPRTITER 1:2,048 (H) 10/21/2021    STI Results GC CT  12/14/2022  9:38 AM Negative    Negative    Negative  Positive    Negative    Negative     Hepatitis B Lab Results  Component Value Date   HEPBSAB REACTIVE (A) 10/21/2021   HEPBSAG NON-REACTIVE 10/21/2021   HEPBCAB NON-REACTIVE 10/21/2021   Hepatitis C Lab Results  Component Value Date   HEPCAB NON-REACTIVE 10/21/2021   Hepatitis A Lab Results  Component Value Date   HAV REACTIVE (A) 10/21/2021   Lipids: Lab Results  Component Value Date   CHOL 191 10/17/2022   TRIG 103 10/17/2022   HDL 43 10/17/2022   CHOLHDL 4.4 10/17/2022   VLDL 25 2021/11/07   LDLCALC 129 (H) 10/17/2022    TARGET DATE: 23rd of the month  Assessment: Gary Barrett presents  today for his maintenance Cabenuva  injections. Past injections were tolerated well without issues. Last HIV RNA was undetectable in November 2024. Doing well with no issues today. He is due for routine lab monitoring, will order CMP, CBC, lipid panel, and HIV RNA today.  He is agreeable to routine oral/rectal/urine cytologies and RPR titer today.   Administered cabotegravir  600mg /50mL in left upper outer quadrant of the gluteal muscle. Administered rilpivirine  900 mg/3mL in the right upper outer quadrant of the gluteal muscle. No issues with injections. He will follow up in 2 months for next set of injections.  Gary Barrett is eligible for this 3rd HPV vaccine today.  Administered to his right deltoid. Can discuss Shingles vaccine series in future appointments.   Plan: - Cabenuva  injections administered - HPV vaccination administered - Routine oral/rectal/urine cytologies and RPR - Routine CMP, CBC, lipid panel, and HIV RNA labs - Next injections scheduled for 7/23 with Erla Haw, 9/22 with Erla Haw - Call with any issues or questions   Valarie Garner, PharmD PGY1 Pharmacy Resident

## 2023-12-11 ENCOUNTER — Ambulatory Visit (INDEPENDENT_AMBULATORY_CARE_PROVIDER_SITE_OTHER): Payer: Self-pay | Admitting: Pharmacist

## 2023-12-11 ENCOUNTER — Encounter: Payer: Self-pay | Admitting: Family Medicine

## 2023-12-11 ENCOUNTER — Other Ambulatory Visit: Payer: Self-pay

## 2023-12-11 DIAGNOSIS — Z23 Encounter for immunization: Secondary | ICD-10-CM

## 2023-12-11 DIAGNOSIS — Z113 Encounter for screening for infections with a predominantly sexual mode of transmission: Secondary | ICD-10-CM

## 2023-12-11 DIAGNOSIS — B20 Human immunodeficiency virus [HIV] disease: Secondary | ICD-10-CM

## 2023-12-11 MED ORDER — CABOTEGRAVIR & RILPIVIRINE ER 600 & 900 MG/3ML IM SUER
1.0000 | Freq: Once | INTRAMUSCULAR | Status: AC
  Administered 2023-12-11: 1 via INTRAMUSCULAR

## 2023-12-12 LAB — C. TRACHOMATIS/N. GONORRHOEAE RNA
C. trachomatis RNA, TMA: NOT DETECTED
N. gonorrhoeae RNA, TMA: NOT DETECTED

## 2023-12-13 ENCOUNTER — Telehealth: Payer: Self-pay | Admitting: Pharmacist

## 2023-12-13 LAB — CBC
HCT: 43.7 % (ref 38.5–50.0)
Hemoglobin: 14.4 g/dL (ref 13.2–17.1)
MCH: 30.9 pg (ref 27.0–33.0)
MCHC: 33 g/dL (ref 32.0–36.0)
MCV: 93.8 fL (ref 80.0–100.0)
MPV: 9.1 fL (ref 7.5–12.5)
Platelets: 242 10*3/uL (ref 140–400)
RBC: 4.66 10*6/uL (ref 4.20–5.80)
RDW: 13.6 % (ref 11.0–15.0)
WBC: 6.6 10*3/uL (ref 3.8–10.8)

## 2023-12-13 LAB — COMPREHENSIVE METABOLIC PANEL WITH GFR
AG Ratio: 1.9 (calc) (ref 1.0–2.5)
ALT: 12 U/L (ref 9–46)
AST: 17 U/L (ref 10–40)
Albumin: 5 g/dL (ref 3.6–5.1)
Alkaline phosphatase (APISO): 52 U/L (ref 36–130)
BUN: 12 mg/dL (ref 7–25)
CO2: 29 mmol/L (ref 20–32)
Calcium: 10 mg/dL (ref 8.6–10.3)
Chloride: 100 mmol/L (ref 98–110)
Creat: 1.21 mg/dL (ref 0.60–1.26)
Globulin: 2.7 g/dL (ref 1.9–3.7)
Glucose, Bld: 85 mg/dL (ref 65–99)
Potassium: 3.9 mmol/L (ref 3.5–5.3)
Sodium: 138 mmol/L (ref 135–146)
Total Bilirubin: 0.7 mg/dL (ref 0.2–1.2)
Total Protein: 7.7 g/dL (ref 6.1–8.1)
eGFR: 82 mL/min/{1.73_m2} (ref >=60)

## 2023-12-13 LAB — CT/NG RNA, TMA RECTAL
Chlamydia Trachomatis RNA: DETECTED — AB
Neisseria Gonorrhoeae RNA: DETECTED — AB

## 2023-12-13 LAB — LIPID PANEL
Cholesterol: 200 mg/dL — ABNORMAL HIGH (ref <200)
HDL: 49 mg/dL (ref >=40)
LDL Cholesterol (Calc): 127 mg/dL — ABNORMAL HIGH
Non-HDL Cholesterol (Calc): 151 mg/dL — ABNORMAL HIGH (ref <130)
Total CHOL/HDL Ratio: 4.1 (calc) (ref <5.0)
Triglycerides: 127 mg/dL (ref <150)

## 2023-12-13 LAB — GC/CHLAMYDIA PROBE, AMP (THROAT)
Chlamydia trachomatis RNA: NOT DETECTED
Neisseria gonorrhoeae RNA: DETECTED — AB

## 2023-12-13 LAB — HIV-1 RNA QUANT-NO REFLEX-BLD
HIV 1 RNA Quant: NOT DETECTED {copies}/mL
HIV-1 RNA Quant, Log: NOT DETECTED {Log_copies}/mL

## 2023-12-13 NOTE — Telephone Encounter (Signed)
 Gary Barrett to discuss his STI test results. His throat swab resulted positive for gonorrhea which will need to be treated in clinic with a dose of IM ceftriaxone. Counseled him that it is a one time dose and will require a follow up appointment to test for cure around a week after his treatment. He stated his understanding. Scheduled him tomorrow 5/22 to get ceftriaxone injection. He has no questions at this time.  Valarie Garner, PharmD PGY1 Pharmacy Resident

## 2023-12-13 NOTE — Progress Notes (Unsigned)
   HPI: Gary Barrett is a 33 y.o. male who presents to the [facility] clinic today for STI testing.  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   CABOTEGRAVIR  & RILPIVIRINE  ER (CABENUVA ) 600 & 900 MG/3ML INJECTION    Inject 1 kit into the muscle every 2 (two) months.   DOCUSATE SODIUM  (COLACE) 100 MG CAPSULE    Take 1 capsule (100 mg total) by mouth 2 (two) times daily.   PANTOPRAZOLE  (PROTONIX ) 40 MG TABLET    Take 1 tablet (40 mg total) by mouth daily.   PROMETHAZINE  (PHENERGAN ) 25 MG TABLET    Take 1 tablet (25 mg total) by mouth every 8 (eight) hours as needed for nausea or vomiting.  Modified Medications   No medications on file  Discontinued Medications   No medications on file    Assessment: Gary Barrett presents today for STI treatment. He was seen on 5/19 in clinic and his oral swab resulted positive for gonorrhea and his rectal swab resulted positive for gonorrhea and  chlamydia.   Ceftriaxone 500 mg IM was administered to the *** gluteal muscle.   Plan: - Ceftriaxone IM injection administered - Order doxycycline  100 mg twice daily x7 days  - Scheduled follow up on *** with *** - Call or message with questions   Valarie Garner, PharmD PGY1 Pharmacy Resident

## 2023-12-14 ENCOUNTER — Telehealth: Payer: Self-pay | Admitting: Pharmacist

## 2023-12-14 ENCOUNTER — Encounter: Admitting: Pharmacist

## 2023-12-14 ENCOUNTER — Ambulatory Visit: Admitting: Pharmacist

## 2023-12-14 DIAGNOSIS — A749 Chlamydial infection, unspecified: Secondary | ICD-10-CM

## 2023-12-14 NOTE — Telephone Encounter (Signed)
 Called Gary Barrett to follow up on cancelled appointment for STI treatment today. Unable to reach him, left voicemail.  Valarie Garner, PharmD PGY1 Pharmacy Resident

## 2023-12-15 NOTE — Telephone Encounter (Signed)
 Called Kaleb again this morning to reschedule visit to treat gonorrhea and chlamydia. Unable to reach him, left voicemail.  Valarie Garner, PharmD PGY1 Pharmacy Resident

## 2023-12-19 MED ORDER — DOXYCYCLINE HYCLATE 100 MG PO TABS: 100.0000 mg | ORAL_TABLET | Freq: Two times a day (BID) | ORAL | 0 refills | Status: DC

## 2023-12-19 NOTE — Addendum Note (Signed)
 Addended by: Sonya Duster on: 12/19/2023 11:34 AM   Modules accepted: Orders

## 2023-12-19 NOTE — Telephone Encounter (Signed)
 Patient returned call about STI results. Sending doxycycline  to his new preferred pharmacy in [facility]. Will RTC Thursday for gonorrhea treatment.  Nicklas Barns, PharmD, CPP, BCIDP, AAHIVP Clinical Pharmacist Practitioner Infectious Diseases Clinical Pharmacist [facility] for Infectious Disease

## 2023-12-20 NOTE — Progress Notes (Unsigned)
 HPI: KOLE HILYARD is a 33 y.o. male who presents to the [facility] pharmacy clinic for gonorrhea treatment.  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   CABOTEGRAVIR  & RILPIVIRINE  ER (CABENUVA ) 600 & 900 MG/3ML INJECTION    Inject 1 kit into the muscle every 2 (two) months.   DOCUSATE SODIUM  (COLACE) 100 MG CAPSULE    Take 1 capsule (100 mg total) by mouth 2 (two) times daily.   DOXYCYCLINE  (VIBRA -TABS) 100 MG TABLET    Take 1 tablet (100 mg total) by mouth 2 (two) times daily.   PANTOPRAZOLE  (PROTONIX ) 40 MG TABLET    Take 1 tablet (40 mg total) by mouth daily.   PROMETHAZINE  (PHENERGAN ) 25 MG TABLET    Take 1 tablet (25 mg total) by mouth every 8 (eight) hours as needed for nausea or vomiting.  Modified Medications   No medications on file  Discontinued Medications   No medications on file    Allergies: Allergies  Allergen Reactions   Zofran  [Ondansetron  Hcl] Other  (See Comments)    Constipation. Pt reports that infection disease physician instructed him to d/c.    Past Medical History: Past Medical History:  Diagnosis Date   HIV (human immunodeficiency virus infection) (HCC)     Social History: Social History   Socioeconomic History   Marital status: Single    Spouse name: Not on file   Number of children: Not on file   Years of education: 16   Highest education level: Not on file  Occupational History   Not on file  Tobacco Use   Smoking status: Some Days    Current packs/day: 0.10    Types: Cigarettes    Passive exposure: Never   Smokeless tobacco: Never   Tobacco comments:    Smokes about 1 pack per week, cutting back  Vaping Use   Vaping status: Never Used  Substance and Sexual Activity   Alcohol use: Not Currently    Alcohol/week: 1.0 standard drink of alcohol    Types: 1 Glasses of wine per week    Comment: occasional   Drug use: Not Currently    Types: Marijuana    Comment: daily - helps with nausea   Sexual activity: Yes    Comment: declined condoms  Other Topics Concern   Not on file  Social History Narrative   Not on file   Social Drivers of Corporate investment banker  Strain: Not on file  Food Insecurity: No Food Insecurity (11/19/2022)   Hunger Vital Sign    Worried About Running Out of Food in the Last Year: Never true    Ran Out of Food in the Last Year: Never true  Transportation Needs: No Transportation Needs (11/19/2022)   PRAPARE - Administrator, Civil Service (Medical): No    Lack of Transportation (Non-Medical): No  Physical Activity: Not on file  Stress: Not on file  Social Connections: Not on file    Labs: Lab Results  Component Value Date   HIV1RNAQUANT NOT DETECTED 12/11/2023   HIV1RNAQUANT Not Detected 06/20/2023   HIV1RNAQUANT Not Detected 02/09/2023   CD4TABS 670 06/20/2023   CD4TABS 716 02/09/2023   CD4TABS 554 06/20/2022    RPR and STI Lab Results  Component Value  Date   LABRPR REACTIVE (A) 06/20/2023   LABRPR REACTIVE (A) 12/14/2022   LABRPR REACTIVE (A) 06/20/2022   LABRPR REACTIVE (A) 10/21/2021   RPRTITER 1:32 (H) 06/20/2023   RPRTITER 1:64 (H) 12/14/2022   RPRTITER 1:128 (H) 06/20/2022   RPRTITER 1:2,048 (H) 10/21/2021    STI Results GC CT  12/14/2022  9:38 AM Negative    Negative    Negative  Positive    Negative    Negative     Hepatitis B Lab Results  Component Value Date   HEPBSAB REACTIVE (A) 10/21/2021   HEPBSAG NON-REACTIVE 10/21/2021   HEPBCAB NON-REACTIVE 10/21/2021   Hepatitis C Lab Results  Component Value Date   HEPCAB NON-REACTIVE 10/21/2021   Hepatitis A Lab Results  Component Value Date   HAV REACTIVE (A) 10/21/2021   Lipids: Lab Results  Component Value Date   CHOL 200 (H) 12/11/2023   TRIG 127 12/11/2023   HDL 49 12/11/2023   CHOLHDL 4.1 12/11/2023   VLDL 25 10/18/2021   LDLCALC 127 (H) 12/11/2023    Assessment: Yaniv presents today for gonorrhea treatment. His oral and rectal swab returned positive on 12/11/23. No known allergies to any antibiotics. His rectal swab also returned positive for chlamydia on 5/19. Prescription for doxycycline  treatment was sent in on 5/27 to his preferred pharmacy.   Administered ceftriaxone 500 mg in *** upper outer quadrant of the gluteal muscle. Dyllin tolerated well. Advised to abstain from sexual activity for 10 days and to ask partners to get tested and treated.  Answered all questions. Will call with any issues.  Plan: - Administered Ceftriaxone 500 mg IM in gluteal muscle - Call with any issues or questions  Valarie Garner, PharmD PGY1 Pharmacy Resident

## 2023-12-21 ENCOUNTER — Ambulatory Visit: Admitting: Pharmacist

## 2023-12-21 ENCOUNTER — Other Ambulatory Visit: Payer: Self-pay

## 2023-12-21 ENCOUNTER — Ambulatory Visit (INDEPENDENT_AMBULATORY_CARE_PROVIDER_SITE_OTHER): Admitting: Pharmacist

## 2023-12-21 DIAGNOSIS — A549 Gonococcal infection, unspecified: Secondary | ICD-10-CM | POA: Diagnosis not present

## 2023-12-21 DIAGNOSIS — Z113 Encounter for screening for infections with a predominantly sexual mode of transmission: Secondary | ICD-10-CM

## 2023-12-21 MED ORDER — CEFTRIAXONE SODIUM 500 MG IJ SOLR
500.0000 mg | Freq: Once | INTRAMUSCULAR | Status: AC
Start: 2023-12-21 — End: 2023-12-21
  Administered 2023-12-21: 500 mg via INTRAMUSCULAR

## 2023-12-21 MED ORDER — DOXYCYCLINE HYCLATE 100 MG PO TABS: ORAL_TABLET | ORAL | 0 refills | Status: AC

## 2023-12-25 ENCOUNTER — Ambulatory Visit: Payer: Self-pay | Admitting: Pharmacist

## 2023-12-26 ENCOUNTER — Telehealth: Payer: Self-pay | Admitting: Pharmacist

## 2023-12-26 NOTE — Telephone Encounter (Signed)
 Gary Barrett requested a call for clarity on the two doxy medications sent to [facility]. I informed pt that one was for treatment and the other was for prevention but he has questions on the different doses. Pt can be reached at [phone].

## 2023-12-26 NOTE — Telephone Encounter (Signed)
 Called him back this afternoon and cleared up everything. Thank you!

## 2023-12-29 ENCOUNTER — Ambulatory Visit: Admitting: Pharmacist

## 2024-01-05 ENCOUNTER — Other Ambulatory Visit: Payer: Self-pay

## 2024-01-05 ENCOUNTER — Other Ambulatory Visit

## 2024-01-05 ENCOUNTER — Ambulatory Visit: Admitting: Family

## 2024-01-05 DIAGNOSIS — Z113 Encounter for screening for infections with a predominantly sexual mode of transmission: Secondary | ICD-10-CM

## 2024-01-06 LAB — GC/CHLAMYDIA PROBE, AMP (THROAT)
Chlamydia trachomatis RNA: NOT DETECTED
Neisseria gonorrhoeae RNA: NOT DETECTED

## 2024-01-08 ENCOUNTER — Ambulatory Visit: Payer: Self-pay | Admitting: Family

## 2024-01-18 ENCOUNTER — Encounter: Payer: Self-pay | Admitting: Internal Medicine

## 2024-01-31 ENCOUNTER — Telehealth: Payer: Self-pay

## 2024-01-31 NOTE — Telephone Encounter (Signed)
 [facility] Patient Advocate Encounter  Patient's medications CABENUVA  have been couriered to [facility] from [facility] and will be administered at the patients appointment on 02/14/24.  Charmaine Sharps, CPhT Specialty Pharmacy Patient [facility] for Infectious Disease Phone: [phone] Fax:  [phone]

## 2024-02-05 ENCOUNTER — Other Ambulatory Visit (HOSPITAL_COMMUNITY): Payer: Self-pay

## 2024-02-05 ENCOUNTER — Other Ambulatory Visit: Payer: Self-pay

## 2024-02-05 NOTE — Progress Notes (Signed)
 Specialty Pharmacy Refill Coordination Note  Gary Barrett is a 33 y.o. male assessed today regarding refills of clinic administered specialty medication(s) Cabotegravir  & Rilpivirine  (CABENUVA )   Clinic requested Courier to Provider Office   Delivery date: 02/08/24   Verified address: [address] [place] [address]   Medication will be filled on 02/07/24.

## 2024-02-07 ENCOUNTER — Other Ambulatory Visit: Payer: Self-pay

## 2024-02-08 ENCOUNTER — Telehealth: Payer: Self-pay

## 2024-02-08 NOTE — Telephone Encounter (Signed)
 [facility] Patient Advocate Encounter  Patient's medications CABENUVA  have been couriered to [facility] from [facility] and will be administered at the patients appointment on 02/14/24.  Charmaine Sharps, CPhT Specialty Pharmacy Patient [facility] for Infectious Disease Phone: [phone] Fax:  [phone]

## 2024-02-14 ENCOUNTER — Encounter: Admitting: Pharmacist

## 2024-02-14 ENCOUNTER — Ambulatory Visit: Admitting: Family

## 2024-02-14 NOTE — Progress Notes (Signed)
 HPI: Gary Barrett is a 33 y.o. male who presents to the [facility] pharmacy clinic for Cabenuva  administration.  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   CABOTEGRAVIR  & RILPIVIRINE  ER (CABENUVA ) 600 & 900 MG/3ML INJECTION    Inject 1 kit into the muscle every 2 (two) months.   DOCUSATE SODIUM  (COLACE) 100 MG CAPSULE    Take 1 capsule (100 mg total) by mouth 2 (two) times daily.   DOXYCYCLINE  (VIBRA -TABS) 100 MG TABLET    Take 1 tablet (100 mg total) by mouth 2 (two) times daily.   DOXYCYCLINE  (VIBRA -TABS) 100 MG TABLET    Take 2 tablets by mouth once within 72 hours of unprotected sexual activity to prevent STIs   PANTOPRAZOLE  (PROTONIX ) 40 MG TABLET    Take 1 tablet (40 mg total) by mouth daily.   PROMETHAZINE  (PHENERGAN ) 25 MG TABLET    Take 1 tablet (25 mg total) by mouth every 8 (eight) hours as needed for nausea or vomiting.  Modified Medications   No medications on  file  Discontinued Medications   No medications on file    Allergies: Allergies  Allergen Reactions   Zofran  [Ondansetron  Hcl] Other (See Comments)    Constipation. Pt reports that infection disease physician instructed him to d/c.    Labs: Lab Results  Component Value Date   HIV1RNAQUANT NOT DETECTED 12/11/2023   HIV1RNAQUANT Not Detected 06/20/2023   HIV1RNAQUANT Not Detected 02/09/2023   CD4TABS 670 06/20/2023   CD4TABS 716 02/09/2023   CD4TABS 554 06/20/2022    RPR and STI Lab Results  Component Value Date   LABRPR REACTIVE (A) 06/20/2023   LABRPR REACTIVE (A) 12/14/2022   LABRPR REACTIVE (A) 06/20/2022   LABRPR REACTIVE (A) 10/21/2021   RPRTITER 1:32 (H) 06/20/2023   RPRTITER 1:64 (H) 12/14/2022   RPRTITER 1:128 (H) 06/20/2022   RPRTITER 1:2,048 (H) 10/21/2021    STI Results GC CT  12/14/2022  9:38 AM Negative    Negative    Negative  Positive    Negative    Negative     Hepatitis B Lab Results  Component Value Date   HEPBSAB REACTIVE (A) 10/21/2021   HEPBSAG NON-REACTIVE 10/21/2021   HEPBCAB NON-REACTIVE 10/21/2021   Hepatitis C Lab Results  Component Value Date   HEPCAB NON-REACTIVE 10/21/2021   Hepatitis A Lab Results  Component Value Date   HAV REACTIVE (A) 10/21/2021   Lipids: Lab Results  Component Value Date   CHOL 200 (H) 12/11/2023   TRIG 127 12/11/2023   HDL 49 12/11/2023   CHOLHDL 4.1 12/11/2023   VLDL 25 10/18/2021   LDLCALC 127 (H) 12/11/2023    TARGET DATE: The 23rd  Assessment: Aniello presents today for his maintenance Cabenuva  injections. Past injections were tolerated well without issues. Last HIV RNA was undetectable in May. Doing well with no issues today.  Tested positive for rectal and oropharyngeal gonorrhea back in May and was treated with IM ceftriaxone . Repeat oral testing on 01/05/24 was negative indicating cure. Declines testing today. He cancelled his follow up with Cathlyn last week so will schedule his next  appointment with him.   Administered cabotegravir  600mg /56mL in left upper outer quadrant of the gluteal muscle. Administered rilpivirine  900 mg/3mL in the right upper outer quadrant of the gluteal muscle. No issues with injections. He will follow up in 2 months for next set of injections.  Plan: - Cabenuva  injections administered - Next injections scheduled for 04/23/24 with Cathlyn (he is aware that this is the last day in his window and he will need to call 2 weeks before to reschedule) and 06/11/24 with [facility] - Call with any issues or questions  Mariell Nester L. Taeden Geller, PharmD, BCIDP, AAHIVP, CPP Clinical Pharmacist Practitioner - Infectious Diseases Clinical Pharmacist Lead - Specialty Pharmacy [facility] for Infectious Disease

## 2024-02-19 ENCOUNTER — Other Ambulatory Visit: Payer: Self-pay

## 2024-02-19 ENCOUNTER — Ambulatory Visit: Admitting: Pharmacist

## 2024-02-19 DIAGNOSIS — Z113 Encounter for screening for infections with a predominantly sexual mode of transmission: Secondary | ICD-10-CM

## 2024-02-19 DIAGNOSIS — B20 Human immunodeficiency virus [HIV] disease: Secondary | ICD-10-CM | POA: Diagnosis not present

## 2024-02-19 MED ORDER — CABOTEGRAVIR & RILPIVIRINE ER 600 & 900 MG/3ML IM SUER
1.0000 | Freq: Once | INTRAMUSCULAR | Status: AC
  Administered 2024-02-19: 1 via INTRAMUSCULAR

## 2024-02-20 ENCOUNTER — Encounter: Payer: Self-pay | Admitting: Pharmacist

## 2024-03-18 ENCOUNTER — Other Ambulatory Visit: Payer: Self-pay

## 2024-04-08 ENCOUNTER — Other Ambulatory Visit: Payer: Self-pay

## 2024-04-08 ENCOUNTER — Other Ambulatory Visit (HOSPITAL_COMMUNITY): Payer: Self-pay

## 2024-04-08 NOTE — Progress Notes (Signed)
 Specialty Pharmacy Refill Coordination Note  Gary Barrett is a 33 y.o. male assessed today regarding refills of clinic administered specialty medication(s) Cabotegravir  & Rilpivirine  (CABENUVA )   Clinic requested Courier to Provider Office   Delivery date: 04/15/24   Verified address: [address] [place] [address]   Medication will be filled on 04/12/24.

## 2024-04-11 ENCOUNTER — Telehealth: Payer: Self-pay

## 2024-04-11 NOTE — Telephone Encounter (Signed)
 [facility] Patient Advocate Encounter  Patient's medications CABENUVA  have been couriered to [facility] from [facility] and will be administered at the patients appointment on 04/23/24.  Charmaine Sharps, CPhT Specialty Pharmacy Patient [facility] for Infectious Disease Phone: [phone] Fax:  [phone]

## 2024-04-12 ENCOUNTER — Other Ambulatory Visit: Payer: Self-pay

## 2024-04-15 ENCOUNTER — Encounter: Admitting: Pharmacist

## 2024-04-23 ENCOUNTER — Other Ambulatory Visit: Payer: Self-pay

## 2024-04-23 ENCOUNTER — Encounter: Payer: Self-pay | Admitting: Family

## 2024-04-23 ENCOUNTER — Ambulatory Visit (INDEPENDENT_AMBULATORY_CARE_PROVIDER_SITE_OTHER): Admitting: Family

## 2024-04-23 VITALS — BP 117/72 | HR 78 | Temp 98.3°F | Resp 16 | Wt 156.8 lb

## 2024-04-23 DIAGNOSIS — A539 Syphilis, unspecified: Secondary | ICD-10-CM

## 2024-04-23 DIAGNOSIS — B20 Human immunodeficiency virus [HIV] disease: Secondary | ICD-10-CM

## 2024-04-23 DIAGNOSIS — Z0000 Encounter for general adult medical examination without abnormal findings: Secondary | ICD-10-CM

## 2024-04-23 MED ORDER — CABOTEGRAVIR & RILPIVIRINE ER 600 & 900 MG/3ML IM SUER
1.0000 | Freq: Once | INTRAMUSCULAR | Status: AC
  Administered 2024-04-23: 1 via INTRAMUSCULAR

## 2024-04-23 NOTE — Patient Instructions (Signed)
 Nice to see you.  We will check your lab work today.  Plan for follow up in 6 months or sooner if needed with lab work on the same day and with pharmacy provider in between   Have a great day and stay safe!

## 2024-04-23 NOTE — Progress Notes (Signed)
 Brief Narrative   Patient ID: Gary Barrett, male    DOB: 08/02/1990, 33 y.o.   MRN: [id number]  Mr. Gary Barrett is a 33 y/o male with HIV-1 disease diagnosed on 10/17/21 with risk factor of MSM. Initial viral load was 40,500 with CD4 count of 199. Genotype with no significant medication resistant mutations. No history of opportunistic infection. [id number] negative. Entered care at [facility] Stage 3. Had nausea with multiple regimens including Biktarvy, Dovato , Delstrigo , Isentress /Truvada . Now on Cabenuva .   Subjective:   Chief Complaint  Patient presents with   Follow-up    B20     HPI:  Gary Barrett is a 33 y.o. male with HIV disease last seen on 02/19/24 by Charlott Flowers, PharmD, CPP with good adherence and tolerance to Cabenvua.  Last lab work completed on 12/11/2023 with viral load that was undetectable and CD4 count which 670.  Kidney function, liver function, electrolytes within normal ranges.  STD testing was positive for chlamydia and gonorrhea.  Status post treatment with 500 mg ceftriaxone  IM once and doxycycline  100 mg x 7 days. Here today for routine follow up.  Mr. Gary Barrett has been doing well since his last office visit and continues to receive Cabenuva  with no adverse side effects.  Covered by [facility].  Working full-time and traveling with recent trips to [place]  and [place] .  Housing, transportation, and access to food are stable.  No new concerns/complaints.  Due for routine dental care and will schedule.  Currently sexually active with condoms and site-specific STD testing offered.  Healthcare maintenance reviewed.  Denies fevers, chills, night sweats, headaches, changes in vision, neck pain/stiffness, nausea, diarrhea, vomiting, lesions or rashes.  Lab Results  Component Value Date   CD4TCELL 38 06/20/2023   CD4TABS 670 06/20/2023   Lab Results  Component Value Date   HIV1RNAQUANT NOT DETECTED 12/11/2023     Allergies  Allergen Reactions    Zofran  [Ondansetron  Hcl] Other (See Comments)    Constipation. Pt reports that infection disease physician instructed him to d/c.      Outpatient Medications Prior to Visit  Medication Sig Dispense Refill   acetaminophen  (TYLENOL ) 500 MG tablet Take 2 tablets (1,000 mg total) by mouth every 6 (six) hours as needed. 120 tablet 0   cabotegravir  & rilpivirine  ER (CABENUVA ) 600 & 900 MG/3ML injection Inject 1 kit into the muscle every 2 (two) months. 6 mL 5   docusate sodium  (COLACE) 100 MG capsule Take 1 capsule (100 mg total) by mouth 2 (two) times daily. 10 capsule 2   doxycycline  (VIBRA -TABS) 100 MG tablet Take 2 tablets by mouth once within 72 hours of unprotected sexual activity to prevent STIs 20 tablet 0   pantoprazole  (PROTONIX ) 40 MG tablet Take 1 tablet (40 mg total) by mouth daily. 90 tablet 3   promethazine  (PHENERGAN ) 25 MG tablet Take 1 tablet (25 mg total) by mouth every 8 (eight) hours as needed for nausea or vomiting. 20 tablet 0   doxycycline  (VIBRA -TABS) 100 MG tablet Take 1 tablet (100 mg total) by mouth 2 (two) times daily. 14 tablet 0   No facility-administered medications prior to visit.     Past Medical History:  Diagnosis Date   HIV (human immunodeficiency virus infection) (HCC)      Past Surgical History:  Procedure Laterality Date   ANAL FISTULOTOMY     BIOPSY  02/08/2022   Procedure: BIOPSY;  Surgeon: Cindie Carlin POUR, DO;  [facility]: [facility];  Service:  Endoscopy;;   BIOPSY  08/22/2023   Procedure: BIOPSY;  Surgeon: Cindie Carlin POUR, DO;  [facility]: [facility];  Service: Endoscopy;;   COLONOSCOPY WITH PROPOFOL  N/A 08/22/2023   Procedure: COLONOSCOPY WITH PROPOFOL ;  Surgeon: Cindie Carlin POUR, DO;  Location: [facility];  Service: Endoscopy;  Laterality: N/A;  830AM,A SA 1   ESOPHAGOGASTRODUODENOSCOPY (EGD) WITH PROPOFOL  N/A 02/08/2022   Procedure: ESOPHAGOGASTRODUODENOSCOPY (EGD) WITH PROPOFOL ;  Surgeon: Cindie Carlin POUR, DO;  Location: [facility];  Service: Endoscopy;  Laterality: N/A;  1:00pm   INNER EAR SURGERY          Review of Systems  Constitutional:  Negative for appetite change, chills, fatigue, fever and unexpected weight change.  Eyes:  Negative for visual disturbance.  Respiratory:  Negative for cough, chest tightness, shortness of breath and wheezing.   Cardiovascular:  Negative for chest pain and leg swelling.  Gastrointestinal:  Negative for abdominal pain, constipation, diarrhea, nausea and vomiting.  Genitourinary:  Negative for dysuria, flank pain, frequency, genital sores, hematuria and urgency.  Skin:  Negative for rash.  Allergic/Immunologic: Negative for immunocompromised state.  Neurological:  Negative for dizziness and headaches.     Objective:   BP 117/72   Pulse 78   Temp 98.3 F (36.8 C) (Oral)   Resp 16   Wt 156 lb 12.8 oz (71.1 kg)   SpO2 99%   BMI 22.50 kg/m  Nursing note and vital signs reviewed.  Physical Exam Constitutional:      General: He is not in acute distress.    Appearance: He is well-developed.  Eyes:     Conjunctiva/sclera: Conjunctivae normal.  Cardiovascular:     Rate and Rhythm: Normal rate and regular rhythm.     Heart sounds: Normal heart sounds. No murmur heard.    No friction rub. No gallop.  Pulmonary:     Effort: Pulmonary effort is normal. No respiratory distress.     Breath sounds: Normal breath sounds. No wheezing or rales.  Chest:     Chest wall: No tenderness.  Abdominal:     General: Bowel sounds are normal.     Palpations: Abdomen is soft.     Tenderness: There is no abdominal tenderness.  Musculoskeletal:     Cervical back: Neck supple.  Lymphadenopathy:     Cervical: No cervical adenopathy.  Skin:    General: Skin is warm and dry.     Findings: No rash.  Neurological:     Mental Status: He is alert and oriented to person, place, and time.  Psychiatric:        Behavior: Behavior normal.        Thought Content: Thought content  normal.        Judgment: Judgment normal.          04/23/2024    3:00 PM 08/16/2023    3:06 PM 06/20/2023    2:32 PM 04/14/2023    8:10 AM 10/17/2022    8:13 AM  Depression screen PHQ 2/9  Decreased Interest 0 0 0 1 0  Down, Depressed, Hopeless 0 0 0 2 0  PHQ - 2 Score 0 0 0 3 0  Altered sleeping 0   2 0  Tired, decreased energy 0   2 0  Change in appetite 0   3 0  Feeling bad or failure about yourself  0   2 0  Trouble concentrating 0   1 0  Moving slowly or fidgety/restless 0  2 0  Suicidal thoughts 0   0 0  PHQ-9 Score 0   15 0  Difficult doing work/chores    Extremely dIfficult Not difficult at all        04/14/2023    8:10 AM 10/17/2022    8:13 AM 07/15/2022    9:28 AM  GAD 7 : Generalized Anxiety Score  Nervous, Anxious, on Edge 2 0 1  Control/stop worrying 2 0 1  Worry too much - different things 2 0 1  Trouble relaxing 2 0 1  Restless 1 0 1  Easily annoyed or irritable 3 0 3  Afraid - awful might happen 3 0 2  Total GAD 7 Score 15 0 10  Anxiety Difficulty Extremely difficult Not difficult at all Somewhat difficult     The ASCVD Risk score (Arnett DK, et al., 2019) failed to calculate for the following reasons:   The 2019 ASCVD risk score is only valid for ages 19 to 16      Assessment & Plan:    Patient Active Problem List   Diagnosis Date Noted   Traumatic injury of head 04/14/2023   GERD (gastroesophageal reflux disease) 04/14/2023   Nausea 04/14/2023   Chlamydia 02/09/2023   Encounter for annual general medical examination with abnormal findings in adult 10/17/2022   Frequency of urination 10/17/2022   Need for RSV vaccination 07/15/2022   Syphilis 06/20/2022   Family history of colon cancer 05/02/2022   Anal pain 05/02/2022   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 Granzow continues to have well-controlled virus with good adherence and tolerance to Cabenuva .  Reviewed previous lab work and discussed plan of care and U equals U.  Covered by [facility].  Social determinants of health reviewed with no interventions indicated.  Injection of Cabenuva  provided with no adverse side effects.  Check lab work.  Plan for follow-up in 6 months or sooner if needed with lab work on the same day and with pharmacy providers in between.      Relevant Orders   Comprehensive metabolic panel with GFR   HIV-1 RNA quant-no reflex-bld   T-helper cell (CD4)- ([facility] clinic only)   Healthcare maintenance   Discussed importance of safe sexual practice and condom use. Condoms and site specific STD testing offered.  Vaccinations reviewed and following counseling declined.  Continue doxyPEP as needed.  Due for routine dental care which he will schedule independent      Syphilis   Most recent syphilis titer 1:32 down from treatment of 1:128. Discussed nature of RPR testing and serofast state and continued monitoring. Check RPR.       Relevant Orders   RPR     I am having Placido Hangartner. [place] maintain his docusate sodium , acetaminophen , promethazine , pantoprazole , cabotegravir  & rilpivirine  ER, and doxycycline . We administered cabotegravir  & rilpivirine  ER.   Meds ordered this encounter  Medications   cabotegravir  & rilpivirine  ER (CABENUVA ) 600 & 900 MG/3ML  injection 1 kit     Follow-up: Return in about 6 months (around 10/21/2024). or sooner if needed.    Cathlyn July, MSN, FNP-C Nurse Practitioner [facility] for Infectious Disease [facility] Main number: [phone]

## 2024-04-23 NOTE — Assessment & Plan Note (Signed)
 Discussed importance of safe sexual practice and condom use. Condoms and site specific STD testing offered.  Vaccinations reviewed and following counseling declined.  Continue doxyPEP as needed.  Due for routine dental care which he will schedule independent

## 2024-04-23 NOTE — Assessment & Plan Note (Signed)
 Most recent syphilis titer 1:32 down from treatment of 1:128. Discussed nature of RPR testing and serofast state and continued monitoring. Check RPR.

## 2024-04-23 NOTE — Assessment & Plan Note (Signed)
 Gary Barrett continues to have well-controlled virus with good adherence and tolerance to Cabenuva .  Reviewed previous lab work and discussed plan of care and U equals U.  Covered by [facility].  Social determinants of health reviewed with no interventions indicated.  Injection of Cabenuva  provided with no adverse side effects.  Check lab work.  Plan for follow-up in 6 months or sooner if needed with lab work on the same day and with pharmacy providers in between.

## 2024-04-24 LAB — T-HELPER CELL (CD4) - (RCID CLINIC ONLY)
CD4 % Helper T Cell: 38 % (ref 33–65)
CD4 T Cell Abs: 881 /uL (ref 400–1790)

## 2024-04-26 LAB — COMPREHENSIVE METABOLIC PANEL WITH GFR
AG Ratio: 1.9 (calc) (ref 1.0–2.5)
ALT: 12 U/L (ref 9–46)
AST: 20 U/L (ref 10–40)
Albumin: 5.2 g/dL — ABNORMAL HIGH (ref 3.6–5.1)
Alkaline phosphatase (APISO): 50 U/L (ref 36–130)
BUN: 13 mg/dL (ref 7–25)
CO2: 28 mmol/L (ref 20–32)
Calcium: 10 mg/dL (ref 8.6–10.3)
Chloride: 100 mmol/L (ref 98–110)
Creat: 1.25 mg/dL (ref 0.60–1.26)
Globulin: 2.7 g/dL (ref 1.9–3.7)
Glucose, Bld: 90 mg/dL (ref 65–99)
Potassium: 3.8 mmol/L (ref 3.5–5.3)
Sodium: 137 mmol/L (ref 135–146)
Total Bilirubin: 0.8 mg/dL (ref 0.2–1.2)
Total Protein: 7.9 g/dL (ref 6.1–8.1)
eGFR: 78 mL/min/1.73m2 (ref >=60)

## 2024-04-26 LAB — RPR: RPR Ser Ql: REACTIVE — AB

## 2024-04-26 LAB — T PALLIDUM AB: T Pallidum Abs: POSITIVE — AB

## 2024-04-26 LAB — HIV-1 RNA QUANT-NO REFLEX-BLD
HIV 1 RNA Quant: NOT DETECTED {copies}/mL
HIV-1 RNA Quant, Log: NOT DETECTED {Log_copies}/mL

## 2024-04-26 LAB — RPR TITER: RPR Titer: 1:64 {titer} — ABNORMAL HIGH

## 2024-04-29 ENCOUNTER — Ambulatory Visit: Payer: Self-pay | Admitting: Family

## 2024-05-11 ENCOUNTER — Ambulatory Visit
Admission: RE | Admit: 2024-05-11 | Discharge: 2024-05-11 | Disposition: A | Discharge status: Home / Self Care | Attending: Internal Medicine

## 2024-05-11 ENCOUNTER — Ambulatory Visit (INDEPENDENT_AMBULATORY_CARE_PROVIDER_SITE_OTHER)

## 2024-05-11 VITALS — BP 141/79 | HR 71 | Temp 98.2°F | Resp 16 | Ht 70.0 in | Wt 160.0 lb

## 2024-05-11 DIAGNOSIS — M79672 Pain in left foot: Secondary | ICD-10-CM | POA: Diagnosis not present

## 2024-05-11 DIAGNOSIS — Z9181 History of falling: Secondary | ICD-10-CM

## 2024-05-11 DIAGNOSIS — M25572 Pain in left ankle and joints of left foot: Secondary | ICD-10-CM

## 2024-05-11 DIAGNOSIS — S92215A Nondisplaced fracture of cuboid bone of left foot, initial encounter for closed fracture: Secondary | ICD-10-CM

## 2024-05-11 MED ORDER — HYDROCODONE-ACETAMINOPHEN 5-325 MG PO TABS: 1.0000 | ORAL_TABLET | Freq: Three times a day (TID) | ORAL | 0 refills | Status: AC | PRN

## 2024-05-11 NOTE — Discharge Instructions (Addendum)
 Your x-rays showed that your foot is broken. You were placed in a splint at today's visit. It is very important that you wear the splint daily until you are seen by Orthopedics. You should also use the crutches until you are seen by Orthopedics.  Please wear the splint at all times. Make sure you contact Orthopedics for an urgent follow up to manage your fracture. I have attached the information for one here in town.  If new or worsening symptoms such as increased pain, increased swelling, or color changes develop, it is recommended that you go directly to the ER.   You can alternate tylenol  and ibuprofen  as directed for pain. I also prescribed you pain medicine for breakthrough pain. By  law, I can only prescribe a small amount. If more is required, you will need to see your PCP or orthopedics. Do not mix with other products containing acetaminophen , do not combine with alcohol or other illicit drugs, do not drive or operate machinery, and refrain from any activity that will require complete attention while taking this medication.

## 2024-05-11 NOTE — ED Provider Notes (Signed)
 [facility]  Note:  This document was prepared using Dragon voice recognition software and may include unintentional dictation errors.  MRN: [id number] DOB: 1990-08-27 DATE: 05/11/24   Subjective:  Chief Complaint:  Chief Complaint  Patient presents with   Ankle Pain     HPI: Gary Barrett is a 33 y.o. male presenting for left foot and left ankle pain for less than one day. Patient states that he putting up Christmas decorations in his house last night and missed the last 2 steps of the step stool that he was standing on. He reports inverting his ankle during the fall. Denies hitting his head or LOC. Pain worse with walking and weightbearing. Patient reports unable to bare weight. He reports taking tylenol  and a muscle relaxer last night with relief. No prior injury or fracture to the ankle or foot. Denies fever, nausea/vomiting, abdominal pain. Endorses left foot pain, left ankle pain. Presents NAD.  Prior to Admission medications   Medication Sig Start Date End Date Taking? Authorizing Provider  acetaminophen  (TYLENOL ) 500 MG tablet Take 2 tablets (1,000 mg total) by mouth every 6 (six) hours as needed. 08/13/22   Patsey Lot, MD  cabotegravir  & rilpivirine  ER (CABENUVA ) 600 & 900 MG/3ML injection Inject 1 kit into the muscle every 2 (two) months. 12/04/23   Kuppelweiser, Cassie L, RPH-CPP  docusate sodium  (COLACE) 100 MG capsule Take 1 capsule (100 mg total) by mouth 2 (two) times daily. 05/11/22   Bacchus, Meade PEDLAR, FNP  doxycycline  (VIBRA -TABS) 100 MG tablet Take 2 tablets by mouth once within 72 hours of unprotected sexual activity to prevent STIs 12/21/23   Waddell Alan PARAS, RPH-CPP  pantoprazole  (PROTONIX ) 40 MG tablet Take 1 tablet (40 mg total) by mouth daily. 08/17/23 08/16/24  Cindie Carlin POUR, DO  promethazine  (PHENERGAN ) 25 MG tablet Take 1 tablet (25 mg total) by mouth every 8 (eight) hours as needed for nausea or vomiting. 04/14/23   Bacchus, Meade PEDLAR, FNP      Allergies  Allergen Reactions   Zofran  [Ondansetron  Hcl] Other (See Comments)    Constipation. Pt reports that infection disease physician instructed him to d/c.    History:   Past Medical History:  Diagnosis Date   HIV (human immunodeficiency virus infection) (HCC)      Past Surgical History:  Procedure Laterality Date   ANAL FISTULOTOMY     BIOPSY  02/08/2022   Procedure: BIOPSY;  Surgeon: Cindie Carlin POUR, DO;  [facility];  Service: Endoscopy;;   BIOPSY  08/22/2023   Procedure: BIOPSY;  Surgeon: Cindie Carlin POUR, DO;  [facility];  Service: Endoscopy;;   COLONOSCOPY WITH PROPOFOL  N/A 08/22/2023   Procedure: COLONOSCOPY WITH PROPOFOL ;  Surgeon: Cindie Carlin POUR, DO;  [facility];  Service: Endoscopy;  Laterality: N/A;  830AM,A SA 1   ESOPHAGOGASTRODUODENOSCOPY (EGD) WITH PROPOFOL  N/A 02/08/2022   Procedure: ESOPHAGOGASTRODUODENOSCOPY (EGD) WITH PROPOFOL ;  Surgeon: Cindie Carlin POUR, DO;  [facility];  Service: Endoscopy;  Laterality: N/A;  1:00pm   INNER EAR SURGERY      Family History  Problem Relation Age of Onset   Heart disease Mother    CAD Mother    Colon cancer Maternal Grandmother    Sudden death Cousin     Social History   Tobacco Use   Smoking status: Some Days    Current packs/day: 0.10    Types: Cigarettes    Passive exposure: Never   Smokeless tobacco:  Never   Tobacco comments:    Smokes about 1 pack per week, cutting back  Vaping Use   Vaping status: Some Days  Substance Use Topics   Alcohol use: Not Currently    Alcohol/week: 5.0 standard drinks of alcohol    Types: 5 Glasses of wine per week    Comment: moderate use   Drug use: Not Currently    Types: Marijuana    Comment: daily - helps with nausea    Review of Systems  Constitutional:  Negative for fever.  Gastrointestinal:  Negative for abdominal pain, nausea and vomiting.  Musculoskeletal:  Positive for arthralgias and myalgias.   Skin:  Negative for wound.  Neurological:  Negative for syncope, numbness and headaches.     Objective:   Vitals: BP (!) 141/79 (BP Location: Right Arm)   Pulse 71   Temp 98.2 F (36.8 C) (Oral)   Resp 16   Ht 5' 10 (1.778 m)   Wt 160 lb (72.6 kg)   SpO2 96%   BMI 22.96 kg/m   Physical Exam Constitutional:      General: He is not in acute distress.    Appearance: Normal appearance. He is well-developed and normal weight. He is not ill-appearing or toxic-appearing.     Comments: Patient unable to weight bear on the left ankle/foot.  HENT:     Head: Normocephalic and atraumatic.  Cardiovascular:     Rate and Rhythm: Normal rate and regular rhythm.     Pulses:          Dorsalis pedis pulses are 2+ on the left side.     Heart sounds: Normal heart sounds.  Pulmonary:     Effort: Pulmonary effort is normal.     Breath sounds: Normal breath sounds.     Comments: Clear to auscultation bilaterally  Abdominal:     General: Bowel sounds are normal.     Palpations: Abdomen is soft.     Tenderness: There is no abdominal tenderness.  Musculoskeletal:     Left ankle: Swelling present. Tenderness present over the lateral malleolus. Decreased range of motion. Normal pulse.     Left foot: Decreased range of motion. Tenderness present. Normal pulse.     Comments: Decreased ROM in left ankle and left foot due to pain with walking and weightbearing. NV intact. No warmth, erythema, or discharge. TTP of left lateral malleolus and along the left metatarsal heads. Mild edema of left ankle.   Feet:     Left foot:     Skin integrity: Skin integrity normal.     Toenail Condition: Left toenails are normal.  Skin:    General: Skin is warm and dry.     Findings: No ecchymosis or wound.  Neurological:     General: No focal deficit present.     Mental Status: He is alert.  Psychiatric:        Mood and Affect: Mood and affect normal.     Results:  Labs: No results found for this or any  previous visit (from the past 24 hours).  Radiology: DG Ankle Complete Left Result Date: 05/11/2024 CLINICAL DATA:  Injured left ankle yesterday. EXAM: LEFT ANKLE COMPLETE - 3+ VIEW COMPARISON:  None Available. FINDINGS: The ankle mortise is maintained. No acute ankle fracture. No osteochondral lesion. No ankle joint effusion. Moderate-sized os trigonum noted. IMPRESSION: No acute bony findings. Electronically Signed   By: MYRTIS Stammer M.D.   On: 05/11/2024 13:51   DG Foot Complete  Left Result Date: 05/11/2024 CLINICAL DATA:  Injured left foot yesterday. EXAM: LEFT FOOT - COMPLETE 3+ VIEW COMPARISON:  None Available. FINDINGS: The joint spaces are maintained. Suspect subtle nondisplaced fracture of the lateral proximal cuboid. CT may be helpful for further evaluation. No other fractures are identified. IMPRESSION: Suspect subtle nondisplaced fracture of the lateral proximal cuboid. Recommend CT for further evaluation. Electronically Signed   By: MYRTIS Stammer M.D.   On: 05/11/2024 13:51     UC Course/Treatments:  Procedures: Procedures   Medications Ordered in UC: Medications - No data to display   Assessment and Plan :     ICD-10-CM   1. Closed nondisplaced fracture of cuboid of left foot, initial encounter  S92.215A     2. Acute left ankle pain  M25.572 DG Ankle Complete Left    DG Ankle Complete Left    3. Acute foot pain, left  M79.672 DG Foot Complete Left    DG Foot Complete Left    4. History of fall  Z91.81      Closed Nondisplaced Fracture of Cuboid of Left Foot, Initial Encounter Acute left ankle pain Acute foot pain, left Afebrile, nontoxic-appearing, NAD. VSS. DDX includes but not limited to: fracture, contusion, dislocation, sprain Radiology over read for suspect left lateral cuboid fracture. Patient was placed in a CAM boot and provided crutches at this time. Recommend patient follow up with orthopedics for fracture management and further imaging if needed. Norco  5-325mg  every 8 hours PRN was prescribed for break through pain. PMP unremarkable. Discussed risks. Advised patient not to mix with other products containing acetaminophen , not to combine with alcohol or other illicit drugs, not to drive or operate machinery, and to refrain from any activity that will require complete attention while taking this medication. Recommend RICE regimen and OTC analgesics as need for pain. Patient declined Toradol  injection. Strict ED precautions were given and patient verbalized understanding.  ED Discharge Orders          Ordered    HYDROcodone-acetaminophen  (NORCO/VICODIN) 5-325 MG tablet  Every 8 hours PRN        05/11/24 1400             I have reviewed the PDMP during this encounter.     Arnold Kester P, PA-C 05/11/24 1402

## 2024-05-11 NOTE — ED Triage Notes (Signed)
 Patient states last night he missed 2 steps on a 4 step ladder while hanging decorations.  Significant pain to left foot and ankle.  RN had him remove the wrap he had applied.  Took a muscle relaxer and 1000mg  Acetaminophen  and nothing today

## 2024-05-30 ENCOUNTER — Other Ambulatory Visit (HOSPITAL_COMMUNITY): Payer: Self-pay

## 2024-05-30 ENCOUNTER — Other Ambulatory Visit: Payer: Self-pay

## 2024-05-30 NOTE — Progress Notes (Signed)
 Specialty Pharmacy Refill Coordination Note  Gary Barrett is a 33 y.o. male assessed today regarding refills of clinic administered specialty medication(s) Cabotegravir  & Rilpivirine  (CABENUVA )   Clinic requested Courier to Provider Office   Delivery date: 06/10/24   Verified address: [address] [place] [address]   Medication will be filled on 06/07/24.

## 2024-06-07 ENCOUNTER — Other Ambulatory Visit: Payer: Self-pay

## 2024-06-10 ENCOUNTER — Telehealth: Payer: Self-pay

## 2024-06-10 NOTE — Progress Notes (Unsigned)
 HPI: Gary Barrett is a 33 y.o. male who presents to the [facility] pharmacy clinic for Cabenuva  administration.  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   CABOTEGRAVIR  & RILPIVIRINE  ER (CABENUVA ) 600 & 900 MG/3ML INJECTION    Inject 1 kit into the muscle every 2 (two) months.   DOCUSATE SODIUM  (COLACE) 100 MG CAPSULE    Take 1 capsule (100 mg total) by mouth 2 (two) times daily.   DOXYCYCLINE  (VIBRA -TABS) 100 MG TABLET    Take 2 tablets by mouth once within 72 hours of unprotected sexual activity to prevent STIs   HYDROCODONE-ACETAMINOPHEN  (NORCO/VICODIN) 5-325 MG TABLET    Take 1 tablet by mouth every 8 (eight) hours as needed for severe pain (pain score 7-10).   PANTOPRAZOLE  (PROTONIX ) 40 MG TABLET    Take 1 tablet (40 mg total) by mouth daily.   PROMETHAZINE  (PHENERGAN ) 25 MG TABLET    Take 1 tablet (25 mg total) by mouth every 8  (eight) hours as needed for nausea or vomiting.  Modified Medications   No medications on file  Discontinued Medications   No medications on file    Allergies: Allergies  Allergen Reactions   Zofran  [Ondansetron  Hcl] Other (See Comments)    Constipation. Pt reports that infection disease physician instructed him to d/c.    Labs: Lab Results  Component Value Date   HIV1RNAQUANT NOT DETECTED 04/23/2024   HIV1RNAQUANT NOT DETECTED 12/11/2023   HIV1RNAQUANT Not Detected 06/20/2023   CD4TABS 881 04/23/2024   CD4TABS 670 06/20/2023   CD4TABS 716 02/09/2023    RPR and STI Lab Results  Component Value Date   LABRPR REACTIVE (A) 04/23/2024   LABRPR REACTIVE (A) 06/20/2023   LABRPR REACTIVE (A) 12/14/2022   LABRPR REACTIVE (A) 06/20/2022   LABRPR REACTIVE (A) 10/21/2021   RPRTITER 1:64 (H) 04/23/2024   RPRTITER 1:32 (H) 06/20/2023   RPRTITER 1:64 (H) 12/14/2022   RPRTITER 1:128 (H) 06/20/2022   RPRTITER 1:2,048 (H) 10/21/2021    STI Results GC CT  12/14/2022  9:38 AM Negative    Negative    Negative  Positive    Negative    Negative     Hepatitis B Lab Results  Component Value Date   HEPBSAB REACTIVE (A) 10/21/2021   HEPBSAG NON-REACTIVE 10/21/2021   HEPBCAB NON-REACTIVE 10/21/2021   Hepatitis C Lab Results  Component Value Date  HEPCAB NON-REACTIVE 10/21/2021   Hepatitis A Lab Results  Component Value Date   HAV REACTIVE (A) 10/21/2021   Lipids: Lab Results  Component Value Date   CHOL 200 (H) 12/11/2023   TRIG 127 12/11/2023   HDL 49 12/11/2023   CHOLHDL 4.1 12/11/2023   VLDL 25 10/18/2021   LDLCALC 127 (H) 12/11/2023    Target Date: 23rd  Assessment: Gary Barrett presents today for his maintenance Cabenuva  injections. Past injections were tolerated well without issues. Last HIV RNA was undetectable on 04/23/2024. Doing well with no issues today.  Lab work:  oral/urine/rectal cytologies for GC/Chlamydia, RPR ***   Eligible vaccinations:  Influenza, Covid, Shingrix 1/2  ***  Cabenuva : Administered cabotegravir  600mg /9mL in left upper outer quadrant of the gluteal muscle. Administered rilpivirine  900 mg/3mL in the right upper outer quadrant of the gluteal muscle. No issues with injections. Gary Barrett will follow up in 2 months for next set of injections.  Plan: - Cabenuva  injections administered - Next injections scheduled for ***1/16-1/30, then 3/16-3/30  - oral/urine/rectal cytologies for GC/Chlamydia, RPR ***  - Call with any issues or questions  Feliciano Close, PharmD PGY2 Infectious Diseases Pharmacy Resident

## 2024-06-10 NOTE — Telephone Encounter (Signed)
 [facility] Patient Advocate Encounter  Patient's medications Cabenuva  have been couriered to [facility] from [facility] and will be administered at the patients appointment on 06/11/24.  Gary Barrett, CPhT Specialty Pharmacy Patient [facility] for Infectious Disease Phone: [phone] Fax:  [phone]

## 2024-06-11 ENCOUNTER — Ambulatory Visit: Payer: Self-pay | Admitting: Pharmacist

## 2024-06-11 DIAGNOSIS — Z113 Encounter for screening for infections with a predominantly sexual mode of transmission: Secondary | ICD-10-CM

## 2024-06-11 NOTE — Progress Notes (Unsigned)
 HPI: Gary Barrett is a 33 y.o. male who presents to the [facility] pharmacy clinic for Cabenuva  administration.  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   CABOTEGRAVIR  & RILPIVIRINE  ER (CABENUVA ) 600 & 900 MG/3ML INJECTION    Inject 1 kit into the muscle every 2 (two) months.   DOCUSATE SODIUM  (COLACE) 100 MG CAPSULE    Take 1 capsule (100 mg total) by mouth 2 (two) times daily.   DOXYCYCLINE  (VIBRA -TABS) 100 MG TABLET    Take 2 tablets by mouth once within 72 hours of unprotected sexual activity to prevent STIs   HYDROCODONE-ACETAMINOPHEN  (NORCO/VICODIN) 5-325 MG TABLET    Take 1 tablet by mouth every 8 (eight) hours as needed for severe pain (pain score 7-10).   PANTOPRAZOLE  (PROTONIX ) 40 MG TABLET    Take 1 tablet (40 mg total) by mouth daily.   PROMETHAZINE  (PHENERGAN ) 25 MG TABLET    Take 1 tablet (25 mg total) by mouth every 8  (eight) hours as needed for nausea or vomiting.  Modified Medications   No medications on file  Discontinued Medications   No medications on file    Allergies: Allergies  Allergen Reactions   Zofran  [Ondansetron  Hcl] Other (See Comments)    Constipation. Pt reports that infection disease physician instructed him to d/c.    Labs: Lab Results  Component Value Date   HIV1RNAQUANT NOT DETECTED 04/23/2024   HIV1RNAQUANT NOT DETECTED 12/11/2023   HIV1RNAQUANT Not Detected 06/20/2023   CD4TABS 881 04/23/2024   CD4TABS 670 06/20/2023   CD4TABS 716 02/09/2023    RPR and STI Lab Results  Component Value Date   LABRPR REACTIVE (A) 04/23/2024   LABRPR REACTIVE (A) 06/20/2023   LABRPR REACTIVE (A) 12/14/2022   LABRPR REACTIVE (A) 06/20/2022   LABRPR REACTIVE (A) 10/21/2021   RPRTITER 1:64 (H) 04/23/2024   RPRTITER 1:32 (H) 06/20/2023   RPRTITER 1:64 (H) 12/14/2022   RPRTITER 1:128 (H) 06/20/2022   RPRTITER 1:2,048 (H) 10/21/2021    STI Results GC CT  12/14/2022  9:38 AM Negative    Negative    Negative  Positive    Negative    Negative     Hepatitis B Lab Results  Component Value Date   HEPBSAB REACTIVE (A) 10/21/2021   HEPBSAG NON-REACTIVE 10/21/2021   HEPBCAB NON-REACTIVE 10/21/2021   Hepatitis C Lab Results  Component Value Date  HEPCAB NON-REACTIVE 10/21/2021   Hepatitis A Lab Results  Component Value Date   HAV REACTIVE (A) 10/21/2021   Lipids: Lab Results  Component Value Date   CHOL 200 (H) 12/11/2023   TRIG 127 12/11/2023   HDL 49 12/11/2023   CHOLHDL 4.1 12/11/2023   VLDL 25 10/18/2021   LDLCALC 127 (H) 12/11/2023    Target Date: 23rd  Assessment: Octavion presents today for his maintenance Cabenuva  injections. Past injections were tolerated well without issues. Last HIV RNA was undetectable on 04/23/2024. Doing well with no issues today.  Lab work:  oral/urine/rectal cytologies for GC/Chlamydia, RPR ***   Eligible vaccinations:  Influenza, Covid, Shingrix 1/2  ***  Cabenuva : Administered cabotegravir  600mg /34mL in left upper outer quadrant of the gluteal muscle. Administered rilpivirine  900 mg/3mL in the right upper outer quadrant of the gluteal muscle. No issues with injections. Abdulrahman will follow up in 2 months for next set of injections.  Plan: - Cabenuva  injections administered - Next injections scheduled for ***1/16-1/30, then 3/16-3/30 Marny?) - oral/urine/rectal cytologies for GC/Chlamydia, RPR ***  - Call with any issues or questions  Feliciano Close, PharmD PGY2 Infectious Diseases Pharmacy Resident

## 2024-06-12 ENCOUNTER — Other Ambulatory Visit: Payer: Self-pay

## 2024-06-12 ENCOUNTER — Ambulatory Visit (INDEPENDENT_AMBULATORY_CARE_PROVIDER_SITE_OTHER): Admitting: Pharmacist

## 2024-06-12 DIAGNOSIS — Z113 Encounter for screening for infections with a predominantly sexual mode of transmission: Secondary | ICD-10-CM

## 2024-06-12 DIAGNOSIS — B20 Human immunodeficiency virus [HIV] disease: Secondary | ICD-10-CM

## 2024-06-12 MED ORDER — CABOTEGRAVIR & RILPIVIRINE ER 600 & 900 MG/3ML IM SUER
1.0000 | Freq: Once | INTRAMUSCULAR | Status: AC
  Administered 2024-06-12: 1 via INTRAMUSCULAR

## 2024-08-02 ENCOUNTER — Telehealth: Payer: Self-pay

## 2024-08-02 NOTE — Telephone Encounter (Signed)
 [facility] Patient Advocate Encounter  Patient's medications CABENUVA  have been couriered to [facility] from [facility] and will be administered at the patients appointment on 08/09/24.  Charmaine Sharps, CPhT Specialty Pharmacy Patient [facility] for Infectious Disease Phone: [phone] Fax:  [phone]

## 2024-08-06 ENCOUNTER — Other Ambulatory Visit (HOSPITAL_COMMUNITY): Payer: Self-pay

## 2024-08-06 ENCOUNTER — Other Ambulatory Visit: Payer: Self-pay

## 2024-08-06 NOTE — Progress Notes (Signed)
 Specialty Pharmacy Refill Coordination Note  FEDERICK LEVENE is a 34 y.o. male assessed today regarding refills of clinic administered specialty medication(s) Cabotegravir  & Rilpivirine  (CABENUVA )   Clinic requested Courier to Provider Office   Delivery date: 08/08/24   Verified address: [address] [place] [address]   Medication will be filled on 08/07/24.

## 2024-08-07 ENCOUNTER — Other Ambulatory Visit: Payer: Self-pay

## 2024-08-08 ENCOUNTER — Telehealth: Payer: Self-pay

## 2024-08-08 NOTE — Telephone Encounter (Signed)
 [facility] Patient Advocate Encounter  Patient's medications CABENUVA  have been couriered to [facility] from [facility] and will be administered at the patients appointment on 08/09/24.  Charmaine Sharps, CPhT Specialty Pharmacy Patient [facility] for Infectious Disease Phone: [phone] Fax:  [phone]

## 2024-08-08 NOTE — Progress Notes (Signed)
 "  HPI: Gary Barrett is a 34 y.o. male who presents to the [facility] pharmacy clinic for Cabenuva  administration.  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   Need for immunization against influenza 04/15/2022   Elevated BP without diagnosis of hypertension 12/28/2021   Constipation 12/07/2021   Hemorrhoids 11/17/2021   Healthcare maintenance 11/17/2021   HIV disease (HCC) 10/21/2021   Encounter for screening for HIV    Syncope November 14, 2021   Diaphoresis Nov 14, 2021   FH: sudden cardiac death (SCD)    FH: premature coronary heart disease     Patient's Medications  New Prescriptions   No medications on file  Previous Medications   ACETAMINOPHEN  (TYLENOL ) 500 MG TABLET    Take 2 tablets (1,000 mg total) by mouth every 6 (six) hours as needed.   CABOTEGRAVIR  & RILPIVIRINE  ER (CABENUVA ) 600 & 900 MG/3ML INJECTION    Inject 1 kit into the muscle every 2 (two) months.   DOCUSATE SODIUM  (COLACE) 100 MG CAPSULE    Take 1 capsule (100 mg total) by mouth 2 (two) times daily.   DOXYCYCLINE  (VIBRA -TABS) 100 MG TABLET    Take 2 tablets by mouth once within 72 hours of unprotected sexual activity to prevent STIs   HYDROCODONE -ACETAMINOPHEN  (NORCO/VICODIN) 5-325 MG TABLET    Take 1 tablet by mouth every 8 (eight) hours as needed for severe pain (pain score 7-10).   PANTOPRAZOLE  (PROTONIX ) 40 MG TABLET    Take 1 tablet (40 mg total) by mouth daily.   PROMETHAZINE  (PHENERGAN ) 25 MG TABLET    Take 1 tablet (25 mg total) by mouth every 8  (eight) hours as needed for nausea or vomiting.  Modified Medications   No medications on file  Discontinued Medications   No medications on file    Allergies: Allergies[1]  Labs: Lab Results  Component Value Date   HIV1RNAQUANT NOT DETECTED 04/23/2024   HIV1RNAQUANT NOT DETECTED 12/11/2023   HIV1RNAQUANT Not Detected 06/20/2023   CD4TABS 881 04/23/2024   CD4TABS 670 06/20/2023   CD4TABS 716 02/09/2023    RPR and STI Lab Results  Component Value Date   LABRPR REACTIVE (A) 04/23/2024   LABRPR REACTIVE (A) 06/20/2023   LABRPR REACTIVE (A) 12/14/2022   LABRPR REACTIVE (A) 06/20/2022   LABRPR REACTIVE (A) 10/21/2021   RPRTITER 1:64 (H) 04/23/2024   RPRTITER 1:32 (H) 06/20/2023   RPRTITER 1:64 (H) 12/14/2022   RPRTITER 1:128 (H) 06/20/2022   RPRTITER 1:2,048 (H) 10/21/2021    STI Results GC CT  12/14/2022  9:38 AM Negative    Negative    Negative  Positive    Negative    Negative     Hepatitis B Lab Results  Component Value Date   HEPBSAB REACTIVE (A) 10/21/2021   HEPBSAG NON-REACTIVE 10/21/2021   HEPBCAB NON-REACTIVE 10/21/2021   Hepatitis C Lab Results  Component Value Date   HEPCAB NON-REACTIVE 10/21/2021   Hepatitis A Lab Results  Component Value Date   HAV REACTIVE (A) 10/21/2021   Lipids: Lab Results  Component  Value Date   CHOL 200 (H) 12/11/2023   TRIG 127 12/11/2023   HDL 49 12/11/2023   CHOLHDL 4.1 12/11/2023   VLDL 25 10/18/2021   LDLCALC 127 (H) 12/11/2023    Target Date: 23rd  Assessment: Gary Barrett presents today for his maintenance Cabenuva  injections. Past injections were tolerated well without issues. Last HIV RNA was undetectable in September. Doing well with no issues today.  Lab work:  Offered STI testing; politely declines STI testing today.   Eligible vaccinations:  Offered flu, COVID, shingles vaccines; politely declines today  Cabenuva : Administered cabotegravir  600mg /79mL in left upper outer quadrant of the gluteal  muscle. Administered rilpivirine  900 mg/3mL in the right upper outer quadrant of the gluteal muscle. No issues with injections. Gary Barrett will follow up in 2 months for next set of injections.  Plan: - Cabenuva  injections administered - Next injections scheduled for 10/08/24 with Gary Barrett wished to defer scheduling May appointment with pharmacy for now - Call with any issues or questions  Maurilio Patten, PharmD PGY1 Pharmacy Resident [facility] 08/08/2024 9:27 PM    [1]  Allergies Allergen Reactions   Zofran  [Ondansetron  Hcl] Other (See Comments)    Constipation. Pt reports that infection disease physician instructed him to d/c.   "

## 2024-08-09 ENCOUNTER — Other Ambulatory Visit: Payer: Self-pay

## 2024-08-09 ENCOUNTER — Ambulatory Visit: Admitting: Pharmacist

## 2024-08-09 DIAGNOSIS — B20 Human immunodeficiency virus [HIV] disease: Secondary | ICD-10-CM | POA: Diagnosis not present

## 2024-08-09 MED ORDER — CABOTEGRAVIR & RILPIVIRINE ER 600 & 900 MG/3ML IM SUER
1.0000 | Freq: Once | INTRAMUSCULAR | Status: AC
  Administered 2024-08-09: 1 via INTRAMUSCULAR

## 2024-08-09 NOTE — Progress Notes (Signed)
 Gary Barrett

## 2024-10-08 ENCOUNTER — Ambulatory Visit: Admitting: Family
